# Patient Record
Sex: Female | Born: 1955 | Race: White | Hispanic: No | Marital: Married | Smoking: Former smoker
Health system: Southern US, Community
[De-identification: ages and names within clinical notes are randomized; demographics above are authoritative.]

## PROBLEM LIST (undated history)

## (undated) DIAGNOSIS — T7840XA Allergy, unspecified, initial encounter: Secondary | ICD-10-CM

## (undated) HISTORY — DX: Allergy, unspecified, initial encounter: T78.40XA

---

## 1999-01-09 ENCOUNTER — Other Ambulatory Visit: Admission: RE | Admit: 1999-01-09 | Discharge: 1999-01-09 | Payer: Self-pay | Admitting: Obstetrics and Gynecology

## 1999-12-04 ENCOUNTER — Other Ambulatory Visit: Admission: RE | Admit: 1999-12-04 | Discharge: 1999-12-04 | Payer: Self-pay | Admitting: Obstetrics and Gynecology

## 2000-09-12 ENCOUNTER — Encounter: Payer: Self-pay | Admitting: Family Medicine

## 2000-09-12 ENCOUNTER — Encounter: Admission: RE | Admit: 2000-09-12 | Discharge: 2000-09-12 | Payer: Self-pay | Admitting: Family Medicine

## 2002-03-04 ENCOUNTER — Other Ambulatory Visit: Admission: RE | Admit: 2002-03-04 | Discharge: 2002-03-04 | Payer: Self-pay | Admitting: Obstetrics and Gynecology

## 2002-03-08 ENCOUNTER — Encounter: Payer: Self-pay | Admitting: Family Medicine

## 2002-03-08 ENCOUNTER — Encounter: Admission: RE | Admit: 2002-03-08 | Discharge: 2002-03-08 | Payer: Self-pay | Admitting: Family Medicine

## 2003-05-10 ENCOUNTER — Other Ambulatory Visit: Admission: RE | Admit: 2003-05-10 | Discharge: 2003-05-10 | Payer: Self-pay | Admitting: Obstetrics and Gynecology

## 2003-10-27 ENCOUNTER — Encounter: Admission: RE | Admit: 2003-10-27 | Discharge: 2003-10-27 | Payer: Self-pay | Admitting: Family Medicine

## 2014-03-23 ENCOUNTER — Ambulatory Visit (INDEPENDENT_AMBULATORY_CARE_PROVIDER_SITE_OTHER): Payer: BC Managed Care – PPO | Admitting: Family Medicine

## 2014-03-23 ENCOUNTER — Ambulatory Visit (INDEPENDENT_AMBULATORY_CARE_PROVIDER_SITE_OTHER): Payer: BC Managed Care – PPO

## 2014-03-23 VITALS — BP 108/72 | HR 99 | Temp 98.0°F | Resp 18 | Ht 65.75 in | Wt 97.2 lb

## 2014-03-23 DIAGNOSIS — R634 Abnormal weight loss: Secondary | ICD-10-CM

## 2014-03-23 DIAGNOSIS — R059 Cough, unspecified: Secondary | ICD-10-CM

## 2014-03-23 DIAGNOSIS — R05 Cough: Secondary | ICD-10-CM

## 2014-03-23 DIAGNOSIS — J209 Acute bronchitis, unspecified: Secondary | ICD-10-CM

## 2014-03-23 DIAGNOSIS — J439 Emphysema, unspecified: Secondary | ICD-10-CM

## 2014-03-23 LAB — POCT CBC
Granulocyte percent: 69.8 %G (ref 37–80)
HCT, POC: 47.2 % (ref 37.7–47.9)
Hemoglobin: 15.1 g/dL (ref 12.2–16.2)
Lymph, poc: 3.3 (ref 0.6–3.4)
MCH, POC: 29.2 pg (ref 27–31.2)
MCHC: 32.1 g/dL (ref 31.8–35.4)
MCV: 90.9 fL (ref 80–97)
MID (cbc): 1.1 — AB (ref 0–0.9)
MPV: 7.1 fL (ref 0–99.8)
POC Granulocyte: 10.2 — AB (ref 2–6.9)
POC LYMPH PERCENT: 22.5 %L (ref 10–50)
POC MID %: 7.7 %M (ref 0–12)
Platelet Count, POC: 437 10*3/uL — AB (ref 142–424)
RBC: 5.19 M/uL (ref 4.04–5.48)
RDW, POC: 14.4 %
WBC: 14.6 10*3/uL — AB (ref 4.6–10.2)

## 2014-03-23 MED ORDER — CEFDINIR 300 MG PO CAPS: 600.0000 mg | ORAL_CAPSULE | Freq: Every day | ORAL | Status: DC

## 2014-03-23 MED ORDER — BENZONATATE 100 MG PO CAPS: 100.0000 mg | ORAL_CAPSULE | Freq: Three times a day (TID) | ORAL | Status: DC | PRN

## 2014-03-23 NOTE — Progress Notes (Signed)
Subjective: 58 year old lady who has not been to this practice for several years. She has been feeling bad since sometime in early October. She developed a problem with a lot of pain in her teeth. Gums and teeth appeared normal. She said this passed away and then it went into headache cough. She is persisted with a bad cough. She is not able to get comfortable or rest well due to the cough. She hurts in the right side of her chest. She has not had particular shortness of breath on exertion. She does have a history of weight loss, about 5 pounds in the last year but about 25 or 30 pounds in the last 5 years. She does not have any problems with nausea or vomiting. She's not been running any fever. She thinks she had pneumonia once a long time ago. She has been a cigarette smoker, not smoking much since she has had this cough. She is retired from Veterinary surgeon work.  Objective: Very thin lady in some discomfort from the constant cough. She holds her chest somewhat she is coughing. The cough is wet. Some tenderness over the maxillary sinuses. Throat is clear. Neck supple without significant nodes. Chest has a very wet cough is described above. On palpation there is a rattling palpable throughout the chest. The right upper lung areas dull to percussion. She has scattered crackles and rhonchi in her lungs. Abdomen is soft and nontender.  Assessment: Cough. Weight loss. Possible sinusitis. History of tobacco use disorder  Plan: Chest x-ray and CBC  Results for orders placed or performed in visit on 03/23/14  POCT CBC  Result Value Ref Range   WBC 14.6 (A) 4.6 - 10.2 K/uL   Lymph, poc 3.3 0.6 - 3.4   POC LYMPH PERCENT 22.5 10 - 50 %L   MID (cbc) 1.1 (A) 0 - 0.9   POC MID % 7.7 0 - 12 %M   POC Granulocyte 10.2 (A) 2 - 6.9   Granulocyte percent 69.8 37 - 80 %G   RBC 5.19 4.04 - 5.48 M/uL   Hemoglobin 15.1 12.2 - 16.2 g/dL   HCT, POC 47.2 37.7 - 47.9 %   MCV 90.9 80 - 97 fL   MCH, POC 29.2 27 - 31.2  pg   MCHC 32.1 31.8 - 35.4 g/dL   RDW, POC 14.4 %   Platelet Count, POC 437 (A) 142 - 424 K/uL   MPV 7.1 0 - 99.8 fL   [facility] reading (PRIMARY) by  Dr. Linna Darner Minimal congestion along right diaphragm.  Emphysema..  Assessment: Acute bronchitis and cough Emphysema Tobacco abuse Right chest wall pain Weight loss  Plan: Will add on a TSH and C met. Treat with Omnicef. She had a rash many years ago from a penicillin, and advised her that there is about a 5% cross allergenicity so to discontinue it quickly and let us know if there is any problem from the [place]. Benzonatate for cough Advise taking ibuprofen or Aleve for the chest wall pain  If she has continued to lose weight over the next 3 months she should return for further checkup.

## 2014-03-23 NOTE — Patient Instructions (Addendum)
Stop smoking. Get away from any excuses for picking up more cigarettes.  Omnicef one twice daily for antibiotic  Benzonatate 1 or 2 pills 3 times daily as needed for cough  Take Aleve 2 pills twice daily for chest wall pain. Other option is to take ibuprofen 800 mg (4x2) 3 times daily.  Drink plenty of fluids and get enough rest so that the body can get rid of the infection  If not much better in 2-3 days come back for a recheck .return sooner if any time if needed.

## 2014-03-24 LAB — TSH: TSH: 1.854 u[IU]/mL (ref 0.350–4.500)

## 2014-03-24 LAB — COMPREHENSIVE METABOLIC PANEL
ALT: 11 U/L (ref 0–35)
AST: 16 U/L (ref 0–37)
Albumin: 3.9 g/dL (ref 3.5–5.2)
Alkaline Phosphatase: 96 U/L (ref 39–117)
BUN: 10 mg/dL (ref 6–23)
CO2: 28 mEq/L (ref 19–32)
Calcium: 9.5 mg/dL (ref 8.4–10.5)
Chloride: 99 mEq/L (ref 96–112)
Creat: 0.53 mg/dL (ref 0.50–1.10)
Glucose, Bld: 84 mg/dL (ref 70–99)
Potassium: 4.5 mEq/L (ref 3.5–5.3)
Sodium: 139 mEq/L (ref 135–145)
Total Bilirubin: 0.5 mg/dL (ref 0.2–1.2)
Total Protein: 6.5 g/dL (ref 6.0–8.3)

## 2014-03-28 ENCOUNTER — Encounter: Payer: Self-pay | Admitting: *Deleted

## 2018-09-21 ENCOUNTER — Emergency Department (HOSPITAL_BASED_OUTPATIENT_CLINIC_OR_DEPARTMENT_OTHER): Payer: BC Managed Care – PPO

## 2018-09-21 ENCOUNTER — Inpatient Hospital Stay (HOSPITAL_COMMUNITY): Payer: BC Managed Care – PPO

## 2018-09-21 ENCOUNTER — Encounter (HOSPITAL_BASED_OUTPATIENT_CLINIC_OR_DEPARTMENT_OTHER): Payer: Self-pay | Admitting: Respiratory Therapy

## 2018-09-21 ENCOUNTER — Other Ambulatory Visit: Payer: Self-pay

## 2018-09-21 ENCOUNTER — Inpatient Hospital Stay (HOSPITAL_BASED_OUTPATIENT_CLINIC_OR_DEPARTMENT_OTHER): Payer: BC Managed Care – PPO

## 2018-09-21 ENCOUNTER — Inpatient Hospital Stay (HOSPITAL_BASED_OUTPATIENT_CLINIC_OR_DEPARTMENT_OTHER)
Admission: EM | Admit: 2018-09-21 | Discharge: 2018-09-25 | DRG: 208 | Disposition: A | Discharge status: Home Health | Payer: BC Managed Care – PPO | Attending: Internal Medicine | Admitting: Internal Medicine

## 2018-09-21 DIAGNOSIS — J9811 Atelectasis: Secondary | ICD-10-CM | POA: Diagnosis present

## 2018-09-21 DIAGNOSIS — Z20828 Contact with and (suspected) exposure to other viral communicable diseases: Secondary | ICD-10-CM | POA: Diagnosis present

## 2018-09-21 DIAGNOSIS — I2729 Other secondary pulmonary hypertension: Secondary | ICD-10-CM | POA: Diagnosis not present

## 2018-09-21 DIAGNOSIS — R188 Other ascites: Secondary | ICD-10-CM | POA: Diagnosis present

## 2018-09-21 DIAGNOSIS — J90 Pleural effusion, not elsewhere classified: Secondary | ICD-10-CM | POA: Diagnosis not present

## 2018-09-21 DIAGNOSIS — R34 Anuria and oliguria: Secondary | ICD-10-CM | POA: Diagnosis not present

## 2018-09-21 DIAGNOSIS — E872 Acidosis: Secondary | ICD-10-CM | POA: Diagnosis present

## 2018-09-21 DIAGNOSIS — J441 Chronic obstructive pulmonary disease with (acute) exacerbation: Secondary | ICD-10-CM | POA: Diagnosis not present

## 2018-09-21 DIAGNOSIS — J9691 Respiratory failure, unspecified with hypoxia: Secondary | ICD-10-CM | POA: Diagnosis not present

## 2018-09-21 DIAGNOSIS — I2721 Secondary pulmonary arterial hypertension: Secondary | ICD-10-CM | POA: Diagnosis present

## 2018-09-21 DIAGNOSIS — E876 Hypokalemia: Secondary | ICD-10-CM | POA: Diagnosis present

## 2018-09-21 DIAGNOSIS — R1084 Generalized abdominal pain: Secondary | ICD-10-CM | POA: Diagnosis present

## 2018-09-21 DIAGNOSIS — Z86718 Personal history of other venous thrombosis and embolism: Secondary | ICD-10-CM

## 2018-09-21 DIAGNOSIS — J9601 Acute respiratory failure with hypoxia: Principal | ICD-10-CM

## 2018-09-21 DIAGNOSIS — J9602 Acute respiratory failure with hypercapnia: Secondary | ICD-10-CM | POA: Diagnosis present

## 2018-09-21 DIAGNOSIS — F17200 Nicotine dependence, unspecified, uncomplicated: Secondary | ICD-10-CM | POA: Diagnosis present

## 2018-09-21 DIAGNOSIS — L89321 Pressure ulcer of left buttock, stage 1: Secondary | ICD-10-CM | POA: Diagnosis present

## 2018-09-21 DIAGNOSIS — Z978 Presence of other specified devices: Secondary | ICD-10-CM

## 2018-09-21 DIAGNOSIS — D689 Coagulation defect, unspecified: Secondary | ICD-10-CM | POA: Diagnosis present

## 2018-09-21 DIAGNOSIS — G9341 Metabolic encephalopathy: Secondary | ICD-10-CM | POA: Diagnosis present

## 2018-09-21 DIAGNOSIS — I5033 Acute on chronic diastolic (congestive) heart failure: Secondary | ICD-10-CM | POA: Diagnosis present

## 2018-09-21 DIAGNOSIS — N179 Acute kidney failure, unspecified: Secondary | ICD-10-CM

## 2018-09-21 DIAGNOSIS — I2781 Cor pulmonale (chronic): Secondary | ICD-10-CM | POA: Diagnosis present

## 2018-09-21 DIAGNOSIS — E871 Hypo-osmolality and hyponatremia: Secondary | ICD-10-CM | POA: Diagnosis present

## 2018-09-21 DIAGNOSIS — E861 Hypovolemia: Secondary | ICD-10-CM | POA: Diagnosis not present

## 2018-09-21 DIAGNOSIS — I5031 Acute diastolic (congestive) heart failure: Secondary | ICD-10-CM | POA: Diagnosis not present

## 2018-09-21 DIAGNOSIS — J9600 Acute respiratory failure, unspecified whether with hypoxia or hypercapnia: Secondary | ICD-10-CM | POA: Diagnosis present

## 2018-09-21 DIAGNOSIS — K761 Chronic passive congestion of liver: Secondary | ICD-10-CM | POA: Diagnosis present

## 2018-09-21 DIAGNOSIS — J9692 Respiratory failure, unspecified with hypercapnia: Secondary | ICD-10-CM | POA: Diagnosis not present

## 2018-09-21 DIAGNOSIS — E875 Hyperkalemia: Secondary | ICD-10-CM

## 2018-09-21 DIAGNOSIS — M7989 Other specified soft tissue disorders: Secondary | ICD-10-CM | POA: Diagnosis not present

## 2018-09-21 LAB — POCT I-STAT EG7
Acid-Base Excess: 2 mmol/L (ref 0.0–2.0)
Bicarbonate: 34.3 mmol/L — ABNORMAL HIGH (ref 20.0–28.0)
Calcium, Ion: 1.09 mmol/L — ABNORMAL LOW (ref 1.15–1.40)
HCT: 53 % — ABNORMAL HIGH (ref 36.0–46.0)
Hemoglobin: 18 g/dL — ABNORMAL HIGH (ref 12.0–15.0)
O2 Saturation: 41 %
Potassium: 6.2 mmol/L — ABNORMAL HIGH (ref 3.5–5.1)
Sodium: 123 mmol/L — ABNORMAL LOW (ref 135–145)
TCO2: 37 mmol/L — ABNORMAL HIGH (ref 22–32)
pCO2, Ven: 89.5 mmHg (ref 44.0–60.0)
pH, Ven: 7.191 — CL (ref 7.250–7.430)
pO2, Ven: 30 mmHg — CL (ref 32.0–45.0)

## 2018-09-21 LAB — URINALYSIS, MICROSCOPIC (REFLEX)

## 2018-09-21 LAB — GLUCOSE, CAPILLARY: Glucose-Capillary: 82 mg/dL (ref 70–99)

## 2018-09-21 LAB — POCT I-STAT 7, (LYTES, BLD GAS, ICA,H+H)
Acid-Base Excess: 2 mmol/L (ref 0.0–2.0)
Bicarbonate: 31.7 mmol/L — ABNORMAL HIGH (ref 20.0–28.0)
Bicarbonate: 29.8 mmol/L — ABNORMAL HIGH (ref 20.0–28.0)
Calcium, Ion: 1.12 mmol/L — ABNORMAL LOW (ref 1.15–1.40)
Calcium, Ion: 1.09 mmol/L — ABNORMAL LOW (ref 1.15–1.40)
HCT: 52 % — ABNORMAL HIGH (ref 36.0–46.0)
HCT: 50 % — ABNORMAL HIGH (ref 36.0–46.0)
Hemoglobin: 17.7 g/dL — ABNORMAL HIGH (ref 12.0–15.0)
Hemoglobin: 17 g/dL — ABNORMAL HIGH (ref 12.0–15.0)
O2 Saturation: 94 %
O2 Saturation: 96 %
Patient temperature: 98.6
Potassium: 5.1 mmol/L (ref 3.5–5.1)
Potassium: 4.6 mmol/L (ref 3.5–5.1)
Sodium: 122 mmol/L — ABNORMAL LOW (ref 135–145)
Sodium: 126 mmol/L — ABNORMAL LOW (ref 135–145)
TCO2: 34 mmol/L — ABNORMAL HIGH (ref 22–32)
TCO2: 31 mmol/L (ref 22–32)
pCO2 arterial: 84.1 mmHg (ref 32.0–48.0)
pCO2 arterial: 56.4 mmHg — ABNORMAL HIGH (ref 32.0–48.0)
pH, Arterial: 7.185 — CL (ref 7.350–7.450)
pH, Arterial: 7.33 — ABNORMAL LOW (ref 7.350–7.450)
pO2, Arterial: 93 mmHg (ref 83.0–108.0)
pO2, Arterial: 87 mmHg (ref 83.0–108.0)

## 2018-09-21 LAB — CBC WITH DIFFERENTIAL/PLATELET
Abs Immature Granulocytes: 0.18 10*3/uL — ABNORMAL HIGH (ref 0.00–0.07)
Basophils Absolute: 0 10*3/uL (ref 0.0–0.1)
Basophils Relative: 0 %
Eosinophils Absolute: 0 10*3/uL (ref 0.0–0.5)
Eosinophils Relative: 0 %
HCT: 53 % — ABNORMAL HIGH (ref 36.0–46.0)
Hemoglobin: 15.9 g/dL — ABNORMAL HIGH (ref 12.0–15.0)
Immature Granulocytes: 1 %
Lymphocytes Relative: 3 %
Lymphs Abs: 0.6 10*3/uL — ABNORMAL LOW (ref 0.7–4.0)
MCH: 27 pg (ref 26.0–34.0)
MCHC: 30 g/dL (ref 30.0–36.0)
MCV: 90 fL (ref 80.0–100.0)
Monocytes Absolute: 2.1 10*3/uL — ABNORMAL HIGH (ref 0.1–1.0)
Monocytes Relative: 11 %
Neutro Abs: 17.1 10*3/uL — ABNORMAL HIGH (ref 1.7–7.7)
Neutrophils Relative %: 85 %
Platelets: 343 10*3/uL (ref 150–400)
RBC: 5.89 MIL/uL — ABNORMAL HIGH (ref 3.87–5.11)
RDW: 14.1 % (ref 11.5–15.5)
Smear Review: NORMAL
WBC: 20.1 10*3/uL — ABNORMAL HIGH (ref 4.0–10.5)
nRBC: 2.1 % — ABNORMAL HIGH (ref 0.0–0.2)

## 2018-09-21 LAB — RAPID URINE DRUG SCREEN, HOSP PERFORMED
Amphetamines: NOT DETECTED
Barbiturates: NOT DETECTED
Benzodiazepines: NOT DETECTED
Cocaine: NOT DETECTED
Opiates: NOT DETECTED
Tetrahydrocannabinol: NOT DETECTED

## 2018-09-21 LAB — ETHANOL: Alcohol, Ethyl (B): <10 mg/dL (ref <10)

## 2018-09-21 LAB — URINALYSIS, ROUTINE W REFLEX MICROSCOPIC
Glucose, UA: NEGATIVE mg/dL
Ketones, ur: NEGATIVE mg/dL
Leukocytes,Ua: NEGATIVE
Nitrite: NEGATIVE
Protein, ur: 30 mg/dL — AB
Specific Gravity, Urine: >1.03 — ABNORMAL HIGH (ref 1.005–1.030)
pH: 5.5 (ref 5.0–8.0)

## 2018-09-21 LAB — BRAIN NATRIURETIC PEPTIDE: B Natriuretic Peptide: 2229 pg/mL — ABNORMAL HIGH (ref 0.0–100.0)

## 2018-09-21 LAB — LACTIC ACID, PLASMA
Lactic Acid, Venous: 3.7 mmol/L (ref 0.5–1.9)
Lactic Acid, Venous: 2.1 mmol/L (ref 0.5–1.9)
Lactic Acid, Venous: 1.6 mmol/L (ref 0.5–1.9)

## 2018-09-21 LAB — COMPREHENSIVE METABOLIC PANEL
ALT: 479 U/L — ABNORMAL HIGH (ref 0–44)
AST: 563 U/L — ABNORMAL HIGH (ref 15–41)
Albumin: 3.6 g/dL (ref 3.5–5.0)
Alkaline Phosphatase: 84 U/L (ref 38–126)
Anion gap: 16 — ABNORMAL HIGH (ref 5–15)
BUN: 55 mg/dL — ABNORMAL HIGH (ref 8–23)
CO2: 23 mmol/L (ref 22–32)
Calcium: 8.1 mg/dL — ABNORMAL LOW (ref 8.9–10.3)
Chloride: 87 mmol/L — ABNORMAL LOW (ref 98–111)
Creatinine, Ser: 1.92 mg/dL — ABNORMAL HIGH (ref 0.44–1.00)
GFR calc Af Amer: 32 mL/min — ABNORMAL LOW (ref >60)
GFR calc non Af Amer: 27 mL/min — ABNORMAL LOW (ref >60)
Glucose, Bld: 109 mg/dL — ABNORMAL HIGH (ref 70–99)
Potassium: 5.5 mmol/L — ABNORMAL HIGH (ref 3.5–5.1)
Sodium: 126 mmol/L — ABNORMAL LOW (ref 135–145)
Total Bilirubin: 1.3 mg/dL — ABNORMAL HIGH (ref 0.3–1.2)
Total Protein: 6.1 g/dL — ABNORMAL LOW (ref 6.5–8.1)

## 2018-09-21 LAB — ACETAMINOPHEN LEVEL: Acetaminophen (Tylenol), Serum: <10 ug/mL — ABNORMAL LOW (ref 10–30)

## 2018-09-21 LAB — MRSA PCR SCREENING: MRSA by PCR: NEGATIVE

## 2018-09-21 LAB — PROTIME-INR
INR: 2 — ABNORMAL HIGH (ref 0.8–1.2)
Prothrombin Time: 22.6 seconds — ABNORMAL HIGH (ref 11.4–15.2)

## 2018-09-21 LAB — SARS CORONAVIRUS 2 AG (30 MIN TAT): SARS Coronavirus 2 Ag: NEGATIVE

## 2018-09-21 LAB — TROPONIN I: Troponin I: 0.06 ng/mL (ref <0.03)

## 2018-09-21 MED ORDER — IPRATROPIUM-ALBUTEROL 0.5-2.5 (3) MG/3ML IN SOLN
3.0000 mL | Freq: Four times a day (QID) | RESPIRATORY_TRACT | Status: DC
  Administered 2018-09-21 – 2018-09-22 (×4): 3 mL via RESPIRATORY_TRACT
  Filled 2018-09-21 (×4): qty 3

## 2018-09-21 MED ORDER — FENTANYL CITRATE (PF) 100 MCG/2ML IJ SOLN
INTRAMUSCULAR | Status: AC
  Filled 2018-09-21: qty 2

## 2018-09-21 MED ORDER — INSULIN ASPART 100 UNIT/ML ~~LOC~~ SOLN: 0.0000 [IU] | SUBCUTANEOUS | Status: DC

## 2018-09-21 MED ORDER — FUROSEMIDE 10 MG/ML IJ SOLN
40.0000 mg | INTRAMUSCULAR | Status: AC
  Administered 2018-09-21: 40 mg via INTRAVENOUS
  Filled 2018-09-21: qty 4

## 2018-09-21 MED ORDER — DOCUSATE SODIUM 50 MG/5ML PO LIQD
100.0000 mg | Freq: Two times a day (BID) | ORAL | Status: DC | PRN
  Filled 2018-09-21: qty 10

## 2018-09-21 MED ORDER — PANTOPRAZOLE SODIUM 40 MG IV SOLR
40.0000 mg | Freq: Every day | INTRAVENOUS | Status: DC
  Administered 2018-09-21: 40 mg via INTRAVENOUS
  Filled 2018-09-21: qty 40

## 2018-09-21 MED ORDER — FENTANYL CITRATE (PF) 100 MCG/2ML IJ SOLN: 50.0000 ug | INTRAMUSCULAR | Status: DC | PRN

## 2018-09-21 MED ORDER — ALBUTEROL SULFATE (2.5 MG/3ML) 0.083% IN NEBU: 2.5000 mg | INHALATION_SOLUTION | RESPIRATORY_TRACT | Status: DC | PRN

## 2018-09-21 MED ORDER — ROCURONIUM BROMIDE 50 MG/5ML IV SOLN
50.0000 mg | Freq: Once | INTRAVENOUS | Status: AC
  Administered 2018-09-21: 20:00:00 50 mg via INTRAVENOUS

## 2018-09-21 MED ORDER — BISACODYL 10 MG RE SUPP: 10.0000 mg | Freq: Every day | RECTAL | Status: DC | PRN

## 2018-09-21 MED ORDER — SODIUM CHLORIDE 0.9 % IV SOLN
2.0000 g | Freq: Once | INTRAVENOUS | Status: AC
  Administered 2018-09-21: 2 g via INTRAVENOUS
  Filled 2018-09-21: qty 2

## 2018-09-21 MED ORDER — HEPARIN SODIUM (PORCINE) 5000 UNIT/ML IJ SOLN
5000.0000 [IU] | Freq: Three times a day (TID) | INTRAMUSCULAR | Status: DC
  Administered 2018-09-21 – 2018-09-25 (×9): 5000 [IU] via SUBCUTANEOUS
  Filled 2018-09-21 (×9): qty 1

## 2018-09-21 MED ORDER — PROPOFOL 1000 MG/100ML IV EMUL
INTRAVENOUS | Status: AC
  Filled 2018-09-21: qty 100

## 2018-09-21 MED ORDER — SODIUM CHLORIDE 0.9 % IV SOLN
500.0000 mg | Freq: Once | INTRAVENOUS | Status: DC
  Filled 2018-09-21: qty 500

## 2018-09-21 MED ORDER — ETOMIDATE 2 MG/ML IV SOLN
10.0000 mg | Freq: Once | INTRAVENOUS | Status: AC
  Administered 2018-09-21: 20:00:00 10 mg via INTRAVENOUS

## 2018-09-21 MED ORDER — CHLORHEXIDINE GLUCONATE 0.12% ORAL RINSE (MEDLINE KIT)
15.0000 mL | Freq: Two times a day (BID) | OROMUCOSAL | Status: DC
  Administered 2018-09-21 – 2018-09-22 (×2): 15 mL via OROMUCOSAL

## 2018-09-21 MED ORDER — SODIUM CHLORIDE 0.9 % IV SOLN
INTRAVENOUS | Status: DC | PRN
  Administered 2018-09-21: 14:00:00 500 mL via INTRAVENOUS

## 2018-09-21 MED ORDER — PROPOFOL 1000 MG/100ML IV EMUL: 0.0000 ug/kg/min | INTRAVENOUS | Status: DC

## 2018-09-21 MED ORDER — ORAL CARE MOUTH RINSE
15.0000 mL | OROMUCOSAL | Status: DC
  Administered 2018-09-21 – 2018-09-22 (×5): 15 mL via OROMUCOSAL

## 2018-09-21 MED ORDER — SODIUM CHLORIDE 0.9 % IV SOLN
1.0000 g | Freq: Once | INTRAVENOUS | Status: DC
  Filled 2018-09-21: qty 10

## 2018-09-21 MED ORDER — PROPOFOL 1000 MG/100ML IV EMUL
5.0000 ug/kg/min | INTRAVENOUS | Status: DC
  Administered 2018-09-21: 30 ug/kg/min via INTRAVENOUS
  Administered 2018-09-21: 5 ug/kg/min via INTRAVENOUS

## 2018-09-21 MED ORDER — VANCOMYCIN HCL IN DEXTROSE 1-5 GM/200ML-% IV SOLN
1000.0000 mg | Freq: Once | INTRAVENOUS | Status: AC
  Administered 2018-09-21: 14:00:00 1000 mg via INTRAVENOUS
  Filled 2018-09-21: qty 200

## 2018-09-21 MED ORDER — AZITHROMYCIN 500 MG IV SOLR
INTRAVENOUS | Status: AC
  Filled 2018-09-21: qty 500

## 2018-09-21 MED ORDER — CEFEPIME HCL 2 G IJ SOLR
INTRAMUSCULAR | Status: AC
  Filled 2018-09-21: qty 2

## 2018-09-21 MED ORDER — PROPOFOL 1000 MG/100ML IV EMUL
5.0000 ug/kg/min | INTRAVENOUS | Status: DC
  Administered 2018-09-21: 22:00:00 35 ug/kg/min via INTRAVENOUS
  Administered 2018-09-22: 10 ug/kg/min via INTRAVENOUS
  Filled 2018-09-21: qty 100

## 2018-09-21 NOTE — ED Notes (Signed)
I spoke with Mr. Evelyn Hughes and informed him that patient is on BiPap, still confused that the plan was to transfer her to [facility] as per his conversation with Dr. Rex Kras earlier.  I informed him that once I got a room assignment, I will give him a call.  I got a  telephone consent from him witnessed by Mena Pauls, RN since patient is still confused.

## 2018-09-21 NOTE — ED Triage Notes (Signed)
Bilateral leg swelling and difficulty breathing started this past Tuesday.

## 2018-09-21 NOTE — ED Notes (Signed)
Date and time results received: 09/21/18 1315 (use smartphrase ".now" to insert current time)  Test: Lactic acid Critical Value: 3.7  Name of Provider Notified: Dr. Rex Kras  Orders Received? Or Actions Taken?: Yes

## 2018-09-21 NOTE — Progress Notes (Signed)
[place] Progress Note Patient Name: Evelyn Hughes DOB: [birth year] MRN: [id number]   Date of Service  09/21/2018  HPI/Events of Note  New admission for Acute hypoxic hypercapnic respiratory failure probably from a COPD and CHF exacerbation.    eICU Interventions  History of COPD and probably new onset CHF. Rapid COVID negative.  AKI.  Troponin 0.06. Notified bedside ICU team.      Intervention Category Major Interventions: Respiratory failure - evaluation and management;Hypercarbia - evaluation and management;Hypoxemia - evaluation and management Intermediate Interventions: Communication with other healthcare providers and/or family Evaluation Type: New Patient Evaluation  Mady Gemma 09/21/2018, 10:12 PM

## 2018-09-21 NOTE — ED Provider Notes (Signed)
Physical Exam  BP 106/70   Pulse 98   Resp 20   Ht 5\' 8"  (1.727 m)   SpO2 100%   BMI 14.78 kg/m   Physical Exam Vitals signs and nursing note reviewed.  Constitutional:      General: She is in acute distress.     Appearance: She is cachectic. She is ill-appearing and toxic-appearing.  Neck:     Vascular: JVD present.  Cardiovascular:     Rate and Rhythm: Normal rate.  Pulmonary:     Breath sounds: Wheezing (diminished breath sounds and wheezing) present.  Musculoskeletal:     Right lower leg: Edema present.     Left lower leg: Edema present.  Neurological:     Mental Status: She is lethargic.     ED Course/Procedures     .Critical Care Performed by: Gareth Morgan, MD Authorized by: Gareth Morgan, MD   Critical care provider statement:    Critical care time (minutes):  45   Critical care was necessary to treat or prevent imminent or life-threatening deterioration of the following conditions:  Respiratory failure   Critical care was time spent personally by me on the following activities:  Re-evaluation of patient's condition, ordering and review of radiographic studies, pulse oximetry, review of old charts, evaluation of patient's response to treatment and discussions with consultants Procedure Name: Intubation Date/Time: 09/22/2018 2:50 AM Performed by: Gareth Morgan, MD Pre-anesthesia Checklist: Patient identified, Patient being monitored, Emergency Drugs available, Timeout performed and Suction available Oxygen Delivery Method: Non-rebreather mask Preoxygenation: Pre-oxygenation with 100% oxygen Induction Type: Rapid sequence Laryngoscope Size: Glidescope Grade View: Grade I Tube size: 7.5 mm Number of attempts: 1 Placement Confirmation: ETT inserted through vocal cords under direct vision,  CO2 detector and Breath sounds checked- equal and bilateral Comments: Preoxygenated with bipap       MDM    Please see Dr. Eddie Dibbles note for prior history,  physical and care.  Briefly, this is a 63 year old female with a history of COPD presented with concern for shortness of breath, leg swelling, and altered mental status.  Patient with both hypoxic and hypercarbic respiratory failure on arrival to the emergency department, with lab abnormalities including lactic acidosis, acute renal failure, hyponatremia, transaminitis, leukocytosis, troponin of 0.06 and BNP of 2229.  COVID-19 testing negative, and have low clinical suspicion at this time.  Suspect most likely etiology of her respiratory failure is a combination of new onset CHF, as well as a COPD.  She had been given empiric antibiotics for possible pneumonia.  At time of signout, CT head, chest abdomen pelvis pending.  CT shows no evidence of acute pathology, does show pleural effusion, signs of anasarca and ascites.  Do feel this is consistent with likely congestive heart failure as a direct source of hypoxic respiratory failure.  On arrival, patient had oxygen saturation down into the 60s.  Initially at time of my assumption of care, patient has been placed on BiPAP for hypercarbic respiratory failure, with initial plan to admit to stepdown unit as patient was protecting her airway.  On reevaluation, she has not shown any improvement, and is difficult to arouse.  Her blood gas shows continued hypercarbic respiratory failure.  While her labs have shown improvement with decreasing lactic acidosis, she continues to have a severe respiratory acidosis and altered mental status despite BiPAP.   Given this, intubated patient as above.  Consulted ICU and patient was transferred in critical condition.      Gareth Morgan, MD 09/22/18  0252  

## 2018-09-21 NOTE — ED Notes (Signed)
ED Provider at bedside. 

## 2018-09-21 NOTE — ED Notes (Signed)
Claudius Sis, RN, Charge Nurse took over the care.  Patient was transferred to Room 14 and was orally intubated.  Report given to Aldona Bar, RN at [name], patient's husband, and informed him of patient's room assignment and ambulance staff is here to transfer patient.

## 2018-09-21 NOTE — ED Provider Notes (Signed)
[facility] EMERGENCY DEPARTMENT Provider Note   CSN: [id number] Arrival date & time: 09/21/18  1208    History   Chief Complaint Chief Complaint  Patient presents with   Swollen Legs    HPI Evelyn Hughes is a 63 y.o. female.     63 year old female with unknown past medical history who presents with leg swelling and shortness of breath.  The patient presents to the ED complaining of bilateral leg swelling and shortness of breath that began 6 days ago.  She is unable to answer any further questions for me. She is not on O2 at home.  Husband was later present to provide further history.  He states that her leg swelling started a week ago and they tried elevation with only mild improvement. He reports she has had poor appetite and worsening generalized weakness. Over the past day, he has also noticed confusion. He has tried to get her to come to the hospital sooner but hasn't been able to convince her until today. She has had occasional cough but no severe cough, fevers, runny nose, or other URI symptoms. No vomiting or diarrhea.  LEVEL 5 CAVEAT DUE TO AMS  The history is provided by the patient. The history is limited by the condition of the patient.    Past Medical History:  Diagnosis Date   Allergy     There are no active problems to display for this patient.   History reviewed. No pertinent surgical history.   OB History    Gravida  2   Para      Term      Preterm      AB      Living  2     SAB      TAB      Ectopic      Multiple      Live Births               Home Medications    Prior to Admission medications   Medication Sig Start Date End Date Taking? Authorizing Provider  benzonatate (TESSALON) 100 MG capsule Take 1-2 capsules (100-200 mg total) by mouth 3 (three) times daily as needed. 03/23/14   Posey Boyer, MD  cefdinir (OMNICEF) 300 MG capsule Take 2 capsules (600 mg total) by mouth daily. 03/23/14   Posey Boyer, MD     Family History Family History  Problem Relation Age of Onset   Cancer Mother     Social History Social History   Tobacco Use   Smoking status: Current Every Day Smoker   Smokeless tobacco: Never Used  Substance Use Topics   Alcohol use: No    Alcohol/week: 0.0 standard drinks   Drug use: No     Allergies   Sulfa antibiotics and Penicillins   Review of Systems Review of Systems  Unable to perform ROS: Mental status change     Physical Exam Updated Vital Signs BP 124/87    Pulse 93    Resp (!) 35    SpO2 100%   Physical Exam Vitals signs and nursing note reviewed.  Constitutional:      Appearance: She is well-developed. She is ill-appearing.     Comments: Thin, frail, ill appearing  HENT:     Head: Normocephalic and atraumatic.     Nose: Nose normal.     Mouth/Throat:     Mouth: Mucous membranes are dry.  Eyes:     Conjunctiva/sclera: Conjunctivae normal.  Pupils: Pupils are equal, round, and reactive to light.  Neck:     Vascular: JVD present.  Cardiovascular:     Rate and Rhythm: Normal rate and regular rhythm.     Heart sounds: Normal heart sounds. No murmur.  Pulmonary:     Effort: Tachypnea, accessory muscle usage and respiratory distress present.     Breath sounds: Rales present.  Abdominal:     General: Bowel sounds are normal. There is no distension.     Palpations: Abdomen is soft.     Tenderness: There is no abdominal tenderness.  Musculoskeletal:     Right lower leg: Edema present.     Left lower leg: Edema present.     Comments: 3+ pitting edema BLE  Skin:    General: Skin is warm and dry.  Neurological:     Mental Status: She is alert.     Comments: Disoriented, unable to follow commands      ED Treatments / Results  Labs (all labs ordered are listed, but only abnormal results are displayed) Labs Reviewed  COMPREHENSIVE METABOLIC PANEL - Abnormal; Notable for the following components:      Result Value   Sodium 126  (*)    Potassium 5.5 (*)    Chloride 87 (*)    Glucose, Bld 109 (*)    BUN 55 (*)    Creatinine, Ser 1.92 (*)    Calcium 8.1 (*)    Total Protein 6.1 (*)    AST 563 (*)    ALT 479 (*)    Total Bilirubin 1.3 (*)    GFR calc non Af Amer 27 (*)    GFR calc Af Amer 32 (*)    Anion gap 16 (*)    All other components within normal limits  ACETAMINOPHEN LEVEL - Abnormal; Notable for the following components:   Acetaminophen (Tylenol), Serum <10 (*)    All other components within normal limits  BRAIN NATRIURETIC PEPTIDE - Abnormal; Notable for the following components:   B Natriuretic Peptide 2,229.0 (*)    All other components within normal limits  TROPONIN I - Abnormal; Notable for the following components:   Troponin I 0.06 (*)    All other components within normal limits  LACTIC ACID, PLASMA - Abnormal; Notable for the following components:   Lactic Acid, Venous 3.7 (*)    All other components within normal limits  CBC WITH DIFFERENTIAL/PLATELET - Abnormal; Notable for the following components:   WBC 20.1 (*)    RBC 5.89 (*)    Hemoglobin 15.9 (*)    HCT 53.0 (*)    nRBC 2.1 (*)    Neutro Abs 17.1 (*)    Lymphs Abs 0.6 (*)    Monocytes Absolute 2.1 (*)    Abs Immature Granulocytes 0.18 (*)    All other components within normal limits  POCT I-STAT EG7 - Abnormal; Notable for the following components:   pH, Ven 7.204 (*)    pCO2, Ven 74.5 (*)    pO2, Ven 75.0 (*)    Bicarbonate 29.4 (*)    Sodium 122 (*)    Potassium 7.4 (*)    Calcium, Ion 0.95 (*)    HCT 56.0 (*)    Hemoglobin 19.0 (*)    All other components within normal limits  SARS CORONAVIRUS 2 (HOSP ORDER, PERFORMED IN [facility] VIA [facility]BBOTT ID)  CULTURE, BLOOD (ROUTINE X 2)  CULTURE, BLOOD (ROUTINE X 2)  ETHANOL  LACTIC ACID, PLASMA  URINALYSIS, ROUTINE W REFLEX MICROSCOPIC  RAPID URINE DRUG SCREEN, HOSP PERFORMED  I-STAT VENOUS BLOOD GAS, ED  I-STAT VENOUS BLOOD GAS, ED    EKG EKG  Interpretation  Date/Time:  Monday Sep 21 2018 12:28:08 EDT Ventricular Rate:  106 PR Interval:    QRS Duration: 75 QT Interval:  290 QTC Calculation: 385 R Axis:   138 Text Interpretation:  Sinus tachycardia Biatrial enlargement Abnormal R-wave progression, late transition Consider left ventricular hypertrophy ST elevation, consider anterior injury No previous ECGs available Confirmed by Theotis Burrow [phone]) on 09/21/2018 12:46:05 PM   Radiology Dg Chest Port 1 View  Result Date: 09/21/2018 CLINICAL DATA:  Onset hypoxia and respiratory distress today. Lower extremity swelling. EXAM: PORTABLE CHEST 1 VIEW COMPARISON:  PA and lateral chest 03/23/2014. FINDINGS: The chest is hyperexpanded with attenuation of the pulmonary vasculature. There is a small right pleural effusion and basilar airspace disease. The left lung is clear. Heart size is normal. No pneumothorax. Aortic atherosclerosis noted. No acute bony abnormality. IMPRESSION: Right effusion and basilar airspace disease most consistent with pneumonia. Recommend follow-up to clearing. Emphysema. Atherosclerosis. Electronically Signed   By: Inge Rise M.D.   On: 09/21/2018 13:46    Procedures .Critical Care Performed by: Sharlett Iles, MD Authorized by: Sharlett Iles, MD   Critical care provider statement:    Critical care time (minutes):  30   Critical care time was exclusive of:  Separately billable procedures and treating other patients   Critical care was necessary to treat or prevent imminent or life-threatening deterioration of the following conditions:  Respiratory failure and dehydration   Critical care was time spent personally by me on the following activities:  Development of treatment plan with patient or surrogate, evaluation of patient's response to treatment, examination of patient, obtaining history from patient or surrogate, ordering and performing treatments and interventions, ordering and review of  laboratory studies, ordering and review of radiographic studies, re-evaluation of patient's condition and review of old charts   (including critical care time)  Medications Ordered in ED Medications  azithromycin (ZITHROMAX) 500 MG injection (has no administration in time range)  ceFEPIme (MAXIPIME) 2 g injection (has no administration in time range)  0.9 %  sodium chloride infusion ( Intravenous Stopped 09/21/18 1441)  vancomycin (VANCOCIN) IVPB 1000 mg/200 mL premix (1,000 mg Intravenous New Bag/Given 09/21/18 1352)  ceFEPIme (MAXIPIME) 2 g in sodium chloride 0.9 % 100 mL IVPB (0 g Intravenous Stopped 09/21/18 1440)     Initial Impression / Assessment and Plan / ED Course  I have reviewed the triage vital signs and the nursing notes.  Pertinent labs & imaging results that were available during my care of the patient were reviewed by me and considered in my medical decision making (see chart for details).        Pt in respiratory distress on arrival, O2 sat 50-60s on room air, placed on high flow [place] with improvement to high 90s. DDx is broad and includes CHF w/ pulmonary edema, pneumonia, COVID-19, COPD.   Lab work shows initial venous pH 7.20 CO2 75, lactate 3.7, troponin 0 0.06, BNP 2229, Na 126, K 5.5, Cl 87, BUN 55, Cr 1.92, AST 563, ALT 479, AG 16, WBC 20, Hgb 15.9, COVID-19 negative. Due to elevated lactate and leukocytosis w/ AMS, Initiated broad spectrum abx w/ vanc and cefepime.   CXR w/ R effusion, possible pneumonia. Because of resp failure and signs of volume overload, I have held off on IVF  bolus.  Have ordered a CT of chest, abdomen, and pelvis due to concerns for possible mass/malignancy or other acute intra-abdominal process causing transaminitis.  I am signing out to oncoming provider pending CT and admission.  Final Clinical Impressions(s) / ED Diagnoses   Final diagnoses:  None    ED Discharge Orders    None       Jamy Cleckler, Wenda Overland, MD 09/21/18 1525

## 2018-09-21 NOTE — ED Notes (Signed)
Patient is obtunded.  Husband at bedside. MD Made aware.

## 2018-09-21 NOTE — H&P (Addendum)
NAME:  Evelyn Hughes, MRN:  [id number], DOB:  Nov 07, 1955, LOS: 0 ADMISSION DATE:  09/21/2018, CONSULTATION DATE:  09/21/2018 REFERRING MD:  Dr. Billy Fischer, CHIEF COMPLAINT:  Respiratory failure  Brief History   33 yoF from home with no known PMH other than tobacco abuse presenting with progressive hx of fatigue, SOB, and leg swelling and new confusion.  Hypoxic failing HFNC and BIPAP requiring intubation.  Additionally noted to have AKI, Transaminitis, and evidence of volume overload concerning for acute decompensated heart failure.   History of present illness   HPI obtained from medical chart review as patient is intubated and sedated on mechanical ventilation and from taking with patient's husband, [name] on telephone.   63 year old female with history of longterm tobacco abuse, otherwise no known history but not regularly followed by medical care presenting from home to [facility] with 3-4 weeks of progressive fatigue and shortness of breath with exertion.  New confusion since yesterday.  Husband reports she doesn't eat a lot normally but has been more pronounced recently and few episodes of diarrhea.  One complaint of generalized abdominal pain.  No reports of fever, chest pain, N/V, and falls.  No weight loss, recent weight gain. No reports of illicit drug abuse or ETOH use in 15 years.   In the ER she was afebrile, normotensive, and hypoxic on room air with saturations 50-60's with respiratory distress.  Labs noted for Na 126, K 5.5, sCr 1.92, BUN 55, CL 87, AST 563, ALT 479, AG 16, WBC 20k, Hgb 15.9, troponin 0.06, BNP 2229, lactic 3.7, ABG 7.2/ 74/ 75/ 29.  EKG with abnormal R wave progression, sinus tachycardia, and biatrial enlargement.  Given her leukocytosis and lactic, she was started empirically on vancomycin and cefepime.  CXR with emphysematous changes with right pleural effusion and basilar atelectasis.  CT head negative and CT abd/ pelvis/ chest showed no acute intra-abdominal process, small  ascites and anasarca, no pneumonia, bilateral pleural effusions, and emphysematous changes. She was attempted on HFNC and BiPAP and given lasix, but given that she was obtunded, required intubation.  PCCM to accept.   Past Medical History  Tobacco abuse   Significant Hospital Events   5/4 admit   Consults:   Procedures:  5/4 ETT >>  Significant Diagnostic Tests:  5/4 CT abd/ pelvis >> 1.  No acute intra-abdominal process. 2. Small ascites and mild anasarca.  5/4 CT chest >> 1. Moderate right and small left pleural effusions.  No pneumonia. 2. Mild right heart enlargement with dilated main pulmonary artery, suggestive of pulmonary arterial hypertension. 3.  Emphysema  4.  Aortic atherosclerosis   5/4 CTH >> Minimal frontal and parietal lobe atrophy.  Otherwise negative exam.  Micro Data:  5/4 COVID >> neg 5/4 BC x 2 >> 5/4 MRSA PCR >> neg  Antimicrobials:  5/4 vanc  5/4 cefepime  Interim history/subjective:  Arrived on propofol gtt.  Objective   Blood pressure 106/70, pulse 78, temperature 98.9 F (37.2 C), temperature source Oral, resp. rate 16, height 5\' 8"  (1.727 m), weight 51.6 kg, SpO2 100 %.    Vent Mode: PRVC FiO2 (%):  [60 %-100 %] 60 % Set Rate:  [16 bmp] 16 bmp Vt Set:  [510 mL] 510 mL PEEP:  [5 cmH20] 5 cmH20 Plateau Pressure:  [26 cmH20-28 cmH20] 26 cmH20   Intake/Output Summary (Last 24 hours) at 09/21/2018 2155 Last data filed at 09/21/2018 1441 Gross per 24 hour  Intake 107.38 ml  Output --  Net 107.38 ml   Filed Weights   09/21/18 2130  Weight: 51.6 kg   Examination: General:  Thin older female sedated on MV in NAD HEENT: MM pink/moist, ETT, OGT, pupils 3/reactive, anicteric, +JVD Neuro: sedated s/p fentanyl, MAE- not f/c CV: RR, no murmur PULM: even/non-labored, lungs bilaterally clear no wheeze GI: thin, no fluid wave, soft, non-tender, bs active, foley Extremities: warm/dry, 3-4+ LE edema, right leg slightly more than left Skin: no  rashes   Resolved Hospital Problem list    Assessment & Plan:   Acute hypoxic/ hypercarbic respiratory failure- possibly multifactorial, evidence of acute volume overload, emphysematous changes, Mod R / small L pleural effusion, and R > L atelectasis Tobacco abuse P:  Full MV support, PRVC 8 cc/kg, rate 16 CXR and ABG now VAP bundle duonebs q 6 and prn  Defer steroids- not bronchospastic at this time Daily SBT trials Diuresis as below Will need further outpatient pulmonary f/u /testing and tobacco cessation counseling Doubtful for PE, unable to do CTA PE, will obtain LE dopplers   Suspected acute decompensated HF given mildly elevated troponin, elevated BNP, and hypervolemia P:  Tele monitoring Trend troponin/ EKG q 6 x 2 TTE in am  S/p lasix - repeating BMP now, consider additional diuresis as BP/ renal tolerated  Hyponatremia- hypervolemic  AKI  Mild hyperkalemia P:  S/p lasix Continue foley  Repeat BMP now Trend BMP / mag/ phos/  urinary output Replace electrolytes as indicated Avoid nephrotoxic agents, ensure adequate renal perfusion  Transaminitis - unclear etiology ? R/t HF, no sign ETOH hx  - tylenol level neg - CT abd as above, showing ascites  P:  Check coags, HCV, hepatitis panel Repeat LFTs in am   Leukocytosis  - UA neg, CT chest w/out evidence of infiltrates P:  Check PCT  Monitor clinically for now Trend WBC/ fever curve Follow BC  Acute encephalopathy - CTH neg, likely related to above P: Trend neuro exams, currently non-focal PAD protocol with propofol and prn fentanyl RASS goal 0/-1   Best practice:  Diet: NPO, if not extubated, start TF 5/5 Pain/Anxiety/Delirium protocol (if indicated): propofol/ prn fentanyl VAP protocol (if indicated): yes DVT prophylaxis: SCDS, heparin SQ GI prophylaxis: PPI Glucose control: CBG q4, add SSI if > 180 Mobility: BR Code Status: Full  Family Communication: Husband, [name], updated by phone.     Disposition: ICU  Labs   CBC: Recent Labs  Lab 09/21/18 1243 09/21/18 1251 09/21/18 1851 09/21/18 1906 09/21/18 2150  WBC 20.1*  --   --   --   --   NEUTROABS 17.1*  --   --   --   --   HGB 15.9* 19.0* 18.0* 17.7* 17.0*  HCT 53.0* 56.0* 53.0* 52.0* 50.0*  MCV 90.0  --   --   --   --   PLT 343  --   --   --   --     Basic Metabolic Panel: Recent Labs  Lab 09/21/18 1243 09/21/18 1251 09/21/18 1851 09/21/18 1906 09/21/18 2150  NA 126* 122* 123* 122* 126*  K 5.5* 7.4* 6.2* 5.1 4.6  CL 87*  --   --   --   --   CO2 23  --   --   --   --   GLUCOSE 109*  --   --   --   --   BUN 55*  --   --   --   --   CREATININE 1.92*  --   --   --   --  CALCIUM 8.1*  --   --   --   --    GFR: Estimated Creatinine Clearance: 24.7 mL/min (A) (by C-G formula based on SCr of 1.92 mg/dL (H)). Recent Labs  Lab 09/21/18 1243 09/21/18 1244 09/21/18 1516 09/21/18 1852  WBC 20.1*  --   --   --   LATICACIDVEN  --  3.7* 2.1* 1.6    Liver Function Tests: Recent Labs  Lab 09/21/18 1243  AST 563*  ALT 479*  ALKPHOS 84  BILITOT 1.3*  PROT 6.1*  ALBUMIN 3.6   No results for input(s): LIPASE, AMYLASE in the last 168 hours. No results for input(s): AMMONIA in the last 168 hours.  ABG    Component Value Date/Time   PHART 7.330 (L) 09/21/2018 2150   PCO2ART 56.4 (H) 09/21/2018 2150   PO2ART 87.0 09/21/2018 2150   HCO3 29.8 (H) 09/21/2018 2150   TCO2 31 09/21/2018 2150   ACIDBASEDEF 2.0 09/21/2018 1251   O2SAT 96.0 09/21/2018 2150     Coagulation Profile: No results for input(s): INR, PROTIME in the last 168 hours.  Cardiac Enzymes: Recent Labs  Lab 09/21/18 1244  TROPONINI 0.06*    HbA1C: No results found for: HGBA1C  CBG: No results for input(s): GLUCAP in the last 168 hours.  Review of Systems:   Unable   Past Medical History  She,  has a past medical history of Allergy.   Surgical History   History reviewed. No pertinent surgical history.   Social History    reports that she has been smoking. She has never used smokeless tobacco. She reports that she does not drink alcohol or use drugs.   Family History   Her family history includes Cancer in her mother.   Allergies Allergies  Allergen Reactions   Sulfa Antibiotics    Penicillins Rash     Home Medications  Prior to Admission medications   Medication Sig Start Date End Date Taking? Authorizing Provider  benzonatate (TESSALON) 100 MG capsule Take 1-2 capsules (100-200 mg total) by mouth 3 (three) times daily as needed. 03/23/14   Posey Boyer, MD  cefdinir (OMNICEF) 300 MG capsule Take 2 capsules (600 mg total) by mouth daily. 03/23/14   Posey Boyer, MD     Critical care time: 75 mins    Kennieth Rad, MSN, AGACNP-BC [facility] Pgr: [phone] or if no answer [phone] 09/21/2018, 11:25 PM   Attending MD note  Patient was independently seen and examined, treatment plan was discussed with the  Advance Practice Provider. I agree with the above note by Kennieth Rad.  I have personally reviewed the clinical findings, labs, ECG, imaging studies and management of this patient in detail. I have also reviewed the orders written for this patient which were under my direction. I agree with the documentation, as recorded by the Advance Practice Provider.  63 year old woman with hypoxic hypercarbic respiratory failure, emphysema by imaging, R sided partially loculated effusion.   Agree with management plan above.  My cc time 45 minutes  This patient is critically ill, requiring high complexity decision making for assessment and plan, frequent evaluation, application of advanced monitoring and extensive interpretations of multiple databases.     Collier Bullock, MD

## 2018-09-22 ENCOUNTER — Inpatient Hospital Stay (HOSPITAL_COMMUNITY): Payer: BC Managed Care – PPO

## 2018-09-22 DIAGNOSIS — N179 Acute kidney failure, unspecified: Secondary | ICD-10-CM | POA: Diagnosis present

## 2018-09-22 DIAGNOSIS — M7989 Other specified soft tissue disorders: Secondary | ICD-10-CM

## 2018-09-22 DIAGNOSIS — J9601 Acute respiratory failure with hypoxia: Secondary | ICD-10-CM

## 2018-09-22 DIAGNOSIS — I2729 Other secondary pulmonary hypertension: Secondary | ICD-10-CM | POA: Diagnosis present

## 2018-09-22 DIAGNOSIS — J90 Pleural effusion, not elsewhere classified: Secondary | ICD-10-CM | POA: Diagnosis present

## 2018-09-22 LAB — HEPATIC FUNCTION PANEL
ALT: 683 U/L — ABNORMAL HIGH (ref 0–44)
AST: 676 U/L — ABNORMAL HIGH (ref 15–41)
Albumin: 2.8 g/dL — ABNORMAL LOW (ref 3.5–5.0)
Alkaline Phosphatase: 68 U/L (ref 38–126)
Bilirubin, Direct: 0.5 mg/dL — ABNORMAL HIGH (ref 0.0–0.2)
Indirect Bilirubin: 1.3 mg/dL — ABNORMAL HIGH (ref 0.3–0.9)
Total Bilirubin: 1.8 mg/dL — ABNORMAL HIGH (ref 0.3–1.2)
Total Protein: 4.4 g/dL — ABNORMAL LOW (ref 6.5–8.1)

## 2018-09-22 LAB — POCT I-STAT EG7
Acid-base deficit: 2 mmol/L (ref 0.0–2.0)
Bicarbonate: 29.4 mmol/L — ABNORMAL HIGH (ref 20.0–28.0)
Calcium, Ion: 0.95 mmol/L — ABNORMAL LOW (ref 1.15–1.40)
HCT: 56 % — ABNORMAL HIGH (ref 36.0–46.0)
Hemoglobin: 19 g/dL — ABNORMAL HIGH (ref 12.0–15.0)
O2 Saturation: 90 %
Potassium: 7.4 mmol/L (ref 3.5–5.1)
Sodium: 122 mmol/L — ABNORMAL LOW (ref 135–145)
TCO2: 32 mmol/L (ref 22–32)
pCO2, Ven: 74.5 mmHg (ref 44.0–60.0)
pH, Ven: 7.204 — ABNORMAL LOW (ref 7.250–7.430)
pO2, Ven: 75 mmHg — ABNORMAL HIGH (ref 32.0–45.0)

## 2018-09-22 LAB — CBC
HCT: 45.7 % (ref 36.0–46.0)
Hemoglobin: 15 g/dL (ref 12.0–15.0)
MCH: 27.5 pg (ref 26.0–34.0)
MCHC: 32.8 g/dL (ref 30.0–36.0)
MCV: 83.7 fL (ref 80.0–100.0)
Platelets: 253 10*3/uL (ref 150–400)
RBC: 5.46 MIL/uL — ABNORMAL HIGH (ref 3.87–5.11)
RDW: 13.9 % (ref 11.5–15.5)
WBC: 24.1 10*3/uL — ABNORMAL HIGH (ref 4.0–10.5)
nRBC: 0.5 % — ABNORMAL HIGH (ref 0.0–0.2)

## 2018-09-22 LAB — GLUCOSE, CAPILLARY
Glucose-Capillary: 86 mg/dL (ref 70–99)
Glucose-Capillary: 81 mg/dL (ref 70–99)
Glucose-Capillary: 74 mg/dL (ref 70–99)
Glucose-Capillary: 81 mg/dL (ref 70–99)
Glucose-Capillary: 86 mg/dL (ref 70–99)

## 2018-09-22 LAB — PROCALCITONIN: Procalcitonin: 0.32 ng/mL

## 2018-09-22 LAB — BASIC METABOLIC PANEL
Anion gap: 14 (ref 5–15)
Anion gap: 21 — ABNORMAL HIGH (ref 5–15)
BUN: 54 mg/dL — ABNORMAL HIGH (ref 8–23)
BUN: 54 mg/dL — ABNORMAL HIGH (ref 8–23)
CO2: 28 mmol/L (ref 22–32)
CO2: 22 mmol/L (ref 22–32)
Calcium: 7.7 mg/dL — ABNORMAL LOW (ref 8.9–10.3)
Calcium: 7.8 mg/dL — ABNORMAL LOW (ref 8.9–10.3)
Chloride: 89 mmol/L — ABNORMAL LOW (ref 98–111)
Chloride: 88 mmol/L — ABNORMAL LOW (ref 98–111)
Creatinine, Ser: 1.54 mg/dL — ABNORMAL HIGH (ref 0.44–1.00)
Creatinine, Ser: 1.36 mg/dL — ABNORMAL HIGH (ref 0.44–1.00)
GFR calc Af Amer: 41 mL/min — ABNORMAL LOW (ref >60)
GFR calc Af Amer: 48 mL/min — ABNORMAL LOW (ref >60)
GFR calc non Af Amer: 36 mL/min — ABNORMAL LOW (ref >60)
GFR calc non Af Amer: 42 mL/min — ABNORMAL LOW (ref >60)
Glucose, Bld: 86 mg/dL (ref 70–99)
Glucose, Bld: 74 mg/dL (ref 70–99)
Potassium: 5 mmol/L (ref 3.5–5.1)
Potassium: 4.8 mmol/L (ref 3.5–5.1)
Sodium: 131 mmol/L — ABNORMAL LOW (ref 135–145)
Sodium: 131 mmol/L — ABNORMAL LOW (ref 135–145)

## 2018-09-22 LAB — MAGNESIUM: Magnesium: 1.7 mg/dL (ref 1.7–2.4)

## 2018-09-22 LAB — TROPONIN I
Troponin I: 0.09 ng/mL (ref <0.03)
Troponin I: 0.08 ng/mL (ref <0.03)

## 2018-09-22 LAB — ECHOCARDIOGRAM LIMITED
Height: 68 in
Weight: 1823.65 oz

## 2018-09-22 LAB — PHOSPHORUS: Phosphorus: 5.9 mg/dL — ABNORMAL HIGH (ref 2.5–4.6)

## 2018-09-22 LAB — TRIGLYCERIDES: Triglycerides: 91 mg/dL (ref <150)

## 2018-09-22 LAB — HIV ANTIBODY (ROUTINE TESTING W REFLEX): HIV Screen 4th Generation wRfx: NONREACTIVE

## 2018-09-22 LAB — BRAIN NATRIURETIC PEPTIDE: B Natriuretic Peptide: 397.2 pg/mL — ABNORMAL HIGH (ref 0.0–100.0)

## 2018-09-22 MED ORDER — PHYTONADIONE 5 MG PO TABS
10.0000 mg | ORAL_TABLET | Freq: Once | ORAL | Status: AC
  Administered 2018-09-22: 10 mg via ORAL
  Filled 2018-09-22: qty 2

## 2018-09-22 MED ORDER — ORAL CARE MOUTH RINSE
15.0000 mL | Freq: Two times a day (BID) | OROMUCOSAL | Status: DC
  Administered 2018-09-22 – 2018-09-25 (×6): 15 mL via OROMUCOSAL

## 2018-09-22 MED ORDER — FUROSEMIDE 10 MG/ML IJ SOLN
40.0000 mg | INTRAMUSCULAR | Status: AC
  Administered 2018-09-22: 15:00:00 40 mg via INTRAVENOUS
  Filled 2018-09-22: qty 4

## 2018-09-22 MED ORDER — IPRATROPIUM-ALBUTEROL 0.5-2.5 (3) MG/3ML IN SOLN
3.0000 mL | Freq: Four times a day (QID) | RESPIRATORY_TRACT | Status: DC
  Administered 2018-09-22: 3 mL via RESPIRATORY_TRACT
  Filled 2018-09-22: qty 3

## 2018-09-22 NOTE — Progress Notes (Signed)
RT note: attempted SBT on patient this AM of CPAP/PSV of 15/5 at 0750.  Patient took a couple of breaths then went apneic and back up ventilation alarmed.  Placed back on full support ventilator settings.  Currently tolerating well.  Will continue to monitor.

## 2018-09-22 NOTE — Progress Notes (Signed)
Bilateral lower extremity venous duplex has been completed. Preliminary results can be found in CV Proc through chart review.   09/22/18 10:09 AM Evelyn Hughes RVT

## 2018-09-22 NOTE — Progress Notes (Signed)
RT called to patient room due to patient self extubating.  Sats are currently 95% on 4L nasal cannula.  Patient is able to speak at this time, apologizing for self extubating.  Vitals are stable.  No stridor noted.  Will continue to monitor.

## 2018-09-22 NOTE — Progress Notes (Signed)
CRITICAL VALUE ALERT  Critical Value:  Troponin 0.09   Date & Time Notied:  5/5 0017  Provider Notified: E-link MD  Orders Received/Actions taken: Awaiting orders

## 2018-09-22 NOTE — Progress Notes (Signed)
  Echocardiogram 2D Echocardiogram has been performed.  Evelyn Hughes 09/22/2018, 2:16 PM

## 2018-09-22 NOTE — Progress Notes (Signed)
NEW ADMISSION NOTE New Admission Note:   Arrival Method:  Patient arrived in bed from 82M. Mental Orientation: Alert x 2, disoriented to time and situation. Telemetry: 39M-04, NSR. Assessment: Completed Skin:  Stage 1 on the left buttock. IV: R AC and L H NSL. Pain:  Denies any pain. Tubes: N/A Safety Measures: Safety Fall Prevention Plan has been given, discussed and signed Admission: Completed [facility] [place] Orientation: Patient has been orientated to the room, unit and staff.  Family:  Orders have been reviewed and implemented. Will continue to monitor the patient. Call light has been placed within reach and bed alarm has been activated.   Amaryllis Dyke, RN

## 2018-09-22 NOTE — Progress Notes (Addendum)
NAME:  Evelyn Hughes, MRN:  [id number], DOB:  28-Aug-1955, LOS: 1 ADMISSION DATE:  09/21/2018, CONSULTATION DATE:  09/21/2018 REFERRING MD:  Dr. Billy Fischer, CHIEF COMPLAINT:  Respiratory failure  Brief History   63 yoF from home with no known PMH other than tobacco abuse presenting with progressive hx of fatigue, SOB, and leg swelling and new confusion.  Hypoxic failing HFNC and BIPAP requiring intubation.  Additionally noted to have AKI, Transaminitis, and evidence of volume overload concerning for acute decompensated heart failure.    Significant Hospital Events   5/4 admit lasix started working dx decompensated HF w/ large right effusion BNP > 2K 5/5: more awake. Tolerating SBT.   Consults:   Procedures:  5/4 ETT >>  Significant Diagnostic Tests:  5/4 CT abd/ pelvis >>1.  No acute intra-abdominal process.2. Small ascites and mild anasarca.  5/4 CT chest >> 1. Moderate right and small left pleural effusions.  No pneumonia. 2. Mild right heart enlargement with dilated main pulmonary artery, suggestive of pulmonary arterial hypertension.3.  Emphysema 4.  Aortic atherosclerosis   5/4 CTH >>Minimal frontal and parietal lobe atrophy.  Otherwise negative exam. Echo 4/5>>> Micro Data:  5/4 COVID >> neg 5/4 BC x 2 >> 5/4 MRSA PCR >> neg  Antimicrobials:  5/4 vanc X 1 5/4 [id number]  Interim history/subjective:  Awake and no distress.   Objective   Blood pressure 93/61, pulse 96, temperature 99.3 F (37.4 C), temperature source Oral, resp. rate 16, height 5\' 8"  (1.727 m), weight 51.7 kg, SpO2 100 %.    Vent Mode: PRVC FiO2 (%):  [40 %-100 %] 40 % Set Rate:  [16 bmp] 16 bmp Vt Set:  [510 mL] 510 mL PEEP:  [5 cmH20] 5 cmH20 Plateau Pressure:  [17 cmH20-28 cmH20] 17 cmH20   Intake/Output Summary (Last 24 hours) at 09/22/2018 1009 Last data filed at 09/22/2018 0600 Gross per 24 hour  Intake 181.34 ml  Output 455 ml  Net -273.66 ml   Filed Weights   09/21/18 2130 09/22/18  0438  Weight: 51.6 kg 51.7 kg   Examination:  General frail 63 year old female, chronically ill appearing. Anxious but not in distress HENT NCAT no JVD orally intubated Pulm scattered rhonchi w/ faint exp wheeze decreased on right  Card RRR w/out MRG abd not tender + bowel sounds Ext brisk CR, warm good pulses. BLE edema Neuro awake interactive tries to communicate. Moves all ext. Not sure of orientation.   Resolved Hospital Problem list    Assessment & Plan:   Acute hypoxic/ hypercarbic respiratory failure- presumed multifactorial:  acute volume overload w/ pulmonary edema superimposed on  emphysematous changes, Mod R / small L pleural effusion, and R > L atelectasis Tobacco abuse pcxr review w/ rather large sized right effusion and bilateral atx  Failed sbt attempt initially this am d/t sedation-->now looks good.  Negative < 300 ml thus far Plan SBT and assess for extubation.  F/u cxr this am VAP bundle Cont duonebs Cont lasix  F/u LE [place] to ensure no DVT Repeat cxr in am and consider right thora    Suspected acute decompensated HF given mildly elevated troponin, elevated BNP, and hypervolemia -trops flat Plan Cont tele F/u TEE Diuresis as able   Fluid and electrolyte imbalance: Hyponatremia- hypervolemia  Plan Repeat chemistry today   AKI Plan Repeat chem  Cont lasix as BUN and cr allow   Transaminitis-->looking worse  - unclear etiology ? R/t HF, no sign ETOH hx ->favor passive  liver congestion  - tylenol level neg - CT abd as above, showing ascites but otherwise neg   plan:  Await hepatitis panel Repeat am Consider repeat US RUQ and GI consult if cont to rise on 5/6  Mild coagulopathy  Plan Trend coags Vit K X 1 10 mg [place]   Leukocytosis  - UA neg, CT chest w/out evidence of infiltrates; PCT neg  plan:  Trend fever curve Trend cbc  Acute metabolic encephalopathy- CTH neg, likely related to a hypercarbia  Plan: PAD protocol w/ RASS goal 0 to -1  Supportive care    Best practice:  Diet: NPO,  Pain/Anxiety/Delirium protocol (if indicated): propofol/ prn fentanyl-->stop 5/5 VAP protocol (if indicated): yes stop 5/5>>> DVT prophylaxis: SCDS, heparin SQ GI prophylaxis: PPI Glucose control: CBG q4, add SSI if > 180 Mobility: BR Code Status: Full  Family Communication: Husband, [name], updated by phone.   Disposition: ICU Critically ill but weaning and titrating ventilatory support. I think she will come off vent today. Needs further echo and may need HF team. Will see what xray looks like in am. May need thora.   Critical care time: 66m    Erick Colace ACNP-BC [place] Pager # [phone] OR # [phone] if no answer

## 2018-09-23 ENCOUNTER — Inpatient Hospital Stay (HOSPITAL_COMMUNITY): Payer: BC Managed Care – PPO

## 2018-09-23 DIAGNOSIS — L89321 Pressure ulcer of left buttock, stage 1: Secondary | ICD-10-CM

## 2018-09-23 LAB — HEPATIC FUNCTION PANEL
ALT: 480 U/L — ABNORMAL HIGH (ref 0–44)
AST: 255 U/L — ABNORMAL HIGH (ref 15–41)
Albumin: 2.4 g/dL — ABNORMAL LOW (ref 3.5–5.0)
Alkaline Phosphatase: 56 U/L (ref 38–126)
Bilirubin, Direct: 0.5 mg/dL — ABNORMAL HIGH (ref 0.0–0.2)
Indirect Bilirubin: 1.2 mg/dL — ABNORMAL HIGH (ref 0.3–0.9)
Total Bilirubin: 1.7 mg/dL — ABNORMAL HIGH (ref 0.3–1.2)
Total Protein: 4 g/dL — ABNORMAL LOW (ref 6.5–8.1)

## 2018-09-23 LAB — BASIC METABOLIC PANEL
Anion gap: 12 (ref 5–15)
BUN: 27 mg/dL — ABNORMAL HIGH (ref 8–23)
CO2: 35 mmol/L — ABNORMAL HIGH (ref 22–32)
Calcium: 7.4 mg/dL — ABNORMAL LOW (ref 8.9–10.3)
Chloride: 88 mmol/L — ABNORMAL LOW (ref 98–111)
Creatinine, Ser: 0.83 mg/dL (ref 0.44–1.00)
GFR calc Af Amer: >60 mL/min (ref >60)
GFR calc non Af Amer: >60 mL/min (ref >60)
Glucose, Bld: 73 mg/dL (ref 70–99)
Potassium: 3.1 mmol/L — ABNORMAL LOW (ref 3.5–5.1)
Sodium: 135 mmol/L (ref 135–145)

## 2018-09-23 LAB — HEPATITIS PANEL, ACUTE
HCV Ab: <0.1 s/co ratio (ref 0.0–0.9)
Hep A IgM: NEGATIVE
Hep B C IgM: NEGATIVE
Hepatitis B Surface Ag: NEGATIVE

## 2018-09-23 LAB — BRAIN NATRIURETIC PEPTIDE: B Natriuretic Peptide: 339.1 pg/mL — ABNORMAL HIGH (ref 0.0–100.0)

## 2018-09-23 MED ORDER — PHYTONADIONE 5 MG PO TABS
10.0000 mg | ORAL_TABLET | Freq: Once | ORAL | Status: AC
  Administered 2018-09-23: 10 mg via ORAL
  Filled 2018-09-23: qty 2

## 2018-09-23 MED ORDER — ADULT MULTIVITAMIN W/MINERALS CH
1.0000 | ORAL_TABLET | Freq: Every day | ORAL | Status: DC
  Administered 2018-09-23 – 2018-09-25 (×3): 1 via ORAL
  Filled 2018-09-23 (×3): qty 1

## 2018-09-23 MED ORDER — IPRATROPIUM-ALBUTEROL 0.5-2.5 (3) MG/3ML IN SOLN
3.0000 mL | Freq: Three times a day (TID) | RESPIRATORY_TRACT | Status: DC
  Administered 2018-09-23 – 2018-09-25 (×6): 3 mL via RESPIRATORY_TRACT
  Filled 2018-09-23 (×6): qty 3

## 2018-09-23 MED ORDER — BUDESONIDE 0.25 MG/2ML IN SUSP
0.2500 mg | Freq: Two times a day (BID) | RESPIRATORY_TRACT | Status: DC
  Administered 2018-09-24 – 2018-09-25 (×3): 0.25 mg via RESPIRATORY_TRACT
  Filled 2018-09-23 (×3): qty 2

## 2018-09-23 MED ORDER — ENSURE ENLIVE PO LIQD
237.0000 mL | Freq: Two times a day (BID) | ORAL | Status: DC
  Administered 2018-09-23 – 2018-09-25 (×3): 237 mL via ORAL

## 2018-09-23 MED ORDER — FUROSEMIDE 10 MG/ML IJ SOLN
40.0000 mg | INTRAMUSCULAR | Status: AC
  Administered 2018-09-23: 40 mg via INTRAVENOUS
  Filled 2018-09-23: qty 4

## 2018-09-23 NOTE — Evaluation (Signed)
Physical Therapy Evaluation Patient Details Name: CARNELIA OSCAR MRN: [id number] DOB: 06/16/1955 Today's Date: 09/23/2018   History of Present Illness  72 yoF from home with no known PMH other than tobacco abuse presenting with progressive hx of fatigue, SOB, and leg swelling and new confusion.  Hypoxic failing HFNC and BIPAP requiring intubation.  Additionally noted to have AKI, Transaminitis, and evidence of volume overload concerning for acute decompensated heart failure.     Clinical Impression  Pt admitted with above diagnosis. Pt currently with functional limitations due to the deficits listed below (see PT Problem List). PTA, pt reports independence with mobility living at home with her husband. [place], ambulating with contact guard, de sat to 80% on RA in 40'. Pt reports cognition not at baseline, with slow processing noted. Anticipate  she will progress well in subsequent PT visits.  Pt will benefit from skilled PT to increase their independence and safety with mobility to allow discharge to the venue listed below.       Follow Up Recommendations Home health PT;Supervision/Assistance - 24 hour    Equipment Recommendations  (TBD pending progress)    Recommendations for Other Services       Precautions / Restrictions Precautions Precautions: Fall Restrictions Weight Bearing Restrictions: No      Mobility  Bed Mobility Overal bed mobility: Modified Independent                Transfers Overall transfer level: Modified independent                  Ambulation/Gait Ambulation/Gait assistance: Min guard Gait Distance (Feet): 40 Feet Assistive device: None Gait Pattern/deviations: Step-through pattern Gait velocity: decreased   General Gait Details: pt with unsteady gait, reports not her baseline. ambulating 40' on RA desat to 80%, returns back on 4L wall O2  Stairs            Wheelchair Mobility    Modified Rankin (Stroke Patients Only)        Balance Overall balance assessment: Needs assistance   Sitting balance-Leahy Scale: Good       Standing balance-Leahy Scale: Fair                               Pertinent Vitals/Pain Pain Assessment: No/denies pain    Home Living Family/patient expects to be discharged to:: Private residence Living Arrangements: Alone Available Help at Discharge: Family;Available 24 hours/day Type of Home: House Home Access: Level entry     Home Layout: One level Home Equipment: None      Prior Function Level of Independence: Independent         Comments: drives, I at baseline     Hand Dominance   Dominant Hand: Right    Extremity/Trunk Assessment   Upper Extremity Assessment Upper Extremity Assessment: Generalized weakness    Lower Extremity Assessment Lower Extremity Assessment: Generalized weakness       Communication   Communication: No difficulties  Cognition Arousal/Alertness: Awake/alert Behavior During Therapy: Flat affect Overall Cognitive Status: Impaired/Different from baseline                                 General Comments: pt reports her memory is not at baseline, slow processing observed, did not know it was may. "i am way more confused than normal toady".       General Comments  Exercises     Assessment/Plan    PT Assessment Patient needs continued PT services  PT Problem List Decreased strength       PT Treatment Interventions DME instruction;Gait training;Stair training;Functional mobility training;Therapeutic activities;Balance training;Therapeutic exercise;Neuromuscular re-education    PT Goals (Current goals can be found in the Care Plan section)  Acute Rehab PT Goals Patient Stated Goal: to go home PT Goal Formulation: With patient Time For Goal Achievement: 10/07/18 Potential to Achieve Goals: Good    Frequency Min 3X/week   Barriers to discharge        Co-evaluation                AM-PAC PT "6 Clicks" Mobility  Outcome Measure Help needed turning from your back to your side while in a flat bed without using bedrails?: None Help needed moving from lying on your back to sitting on the side of a flat bed without using bedrails?: None Help needed moving to and from a bed to a chair (including a wheelchair)?: A Little Help needed standing up from a chair using your arms (e.g., wheelchair or bedside chair)?: A Little Help needed to walk in hospital room?: A Little Help needed climbing 3-5 steps with a railing? : A Lot 6 Click Score: 19    End of Session Equipment Utilized During Treatment: Gait belt Activity Tolerance: Patient tolerated treatment well Patient left: in bed;with call bell/phone within reach;with bed alarm set Nurse Communication: Mobility status PT Visit Diagnosis: Unsteadiness on feet (R26.81)    Time: 0370-4888 PT Time Calculation (min) (ACUTE ONLY): 27 min   Charges:   PT Evaluation $PT Eval Low Complexity: 1 Low PT Treatments $Gait Training: 8-22 mins        Reinaldo Berber, PT, DPT Acute Rehabilitation Services Pager: [phone] Office: [place] 09/23/2018, 3:58 PM

## 2018-09-23 NOTE — Progress Notes (Signed)
Initial Nutrition Assessment  DOCUMENTATION CODES:   Underweight  INTERVENTION:    Ensure Enlive po BID, each supplement provides 350 kcal and 20 grams of protein  MVI daily  NUTRITION DIAGNOSIS:   Increased nutrient needs related to acute illness as evidenced by estimated needs.  GOAL:   Patient will meet greater than or equal to 90% of their needs   MONITOR:   Supplement acceptance, Diet advancement, Weight trends, Labs, Skin, I & O's, PO intake  REASON FOR ASSESSMENT:   Other (Comment)(Low BMI)    ASSESSMENT:   Patient with PMH significant for tobacco abuse. Presents this admission with acute decompensation CHF with underlying COPD.    5/5- self extubated   RD working remotely.  Spoke with pt via phone. Denies having a loss in appetite PTA. States she typically eats 1.5 meals daily that consist of different restaurant food options (unable to specify meal types). Meal completions charted as 100% for pt's last meal. Pt unhappy about about being on a sodium restriction. Diet was changed to soft this am. Discussed the importance of protein intake for preservation of lean body mass. Pt has never tried supplementation but amendable to Ensure this stay.   Pt reports a UBW of 100 lb and denies unintentional wt loss. Records are limited in wt history but show pt weighed 44.1 kg in 2015. Suspect pt could be chronically malnourished but unable to diagnosis with lack of evidence.   I/O: -2,229 ml since admit UOP: 2,425 ml x 24 hrs   Medications: Vit K Labs: K 3.1 (L) elevated LFTs   Diet Order:   Diet Order            DIET SOFT Room service appropriate? Yes; Fluid consistency: Thin  Diet effective now              EDUCATION NEEDS:   Education needs have been addressed  Skin:  Skin Assessment: Skin Integrity Issues: Skin Integrity Issues:: Stage I, Other (Comment) Stage I: buttocks  Other: MASD- medil/lower back   Last BM:  5/5  Height:   Ht Readings from  Last 1 Encounters:  09/21/18 5\' 8"  (1.727 m)    Weight:   Wt Readings from Last 1 Encounters:  09/22/18 51.7 kg    Ideal Body Weight:  63.6 kg  BMI:  Body mass index is 17.33 kg/m.  Estimated Nutritional Needs:   Kcal:  1550-1750 kcal  Protein:  75-90 grams  Fluid:  >/= 1.5 L/day   Mariana Single RD, LDN Clinical Nutrition Pager # - [phone]

## 2018-09-23 NOTE — Progress Notes (Addendum)
PROGRESS NOTE  Evelyn Hughes QQP:[id number] DOB: 1956-02-14 DOA: 09/21/2018 PCP: Patient, No Pcp Per   LOS: 2 days   Brief narrative: 63 year old female with no documented past medical history but not under recent medical care, presented to the hospital with dyspnea, lower extremity edema, encephalopathy and hypoxic respiratory failure.  She also had acute renal failure and decompensated CHF on presentation with volume overload.   Chest x-ray showed hyperinflation and profound emphysematous changes, infiltrates with a large right pleural effusion, BNP 2000.  She received antibiotics x1, discontinued in absence of clear evidence for pneumonia.  Patient was intubated for hypoxic respiratory failure despite being on high flow nasal cannula and BiPAP.  Patient frequently, self extubated and has tolerated nasal cannula well.  Patient was then considered for transfer to medical service from the ICU.    Subjective: Denies interval complaints.  Denies any chest pain, palpitation, fever chest pain or nausea/vomiting.  Has some cough.  Assessment/Plan:  Active Problems:   Acute respiratory failure with hypoxia and hypercapnia (HCC)   Acute respiratory failure (HCC)   Pleural effusion on right   Other secondary pulmonary hypertension (HCC)   Acute renal failure (HCC)   Stage I pressure ulcer of left buttock   acute diastolic HF with  underlying COPD -trops flat.  Continue oxygen nebulizers.  Will give 1 dose of Lasix again today.  2D echocardiogram reviewed with preserved LV function of 55 to 60% with estimated right ventricular systolic pressure of 46 mmHg.  Currently on 4 L of nasal cannula.  Might need home oxygen on discharge.  Will provide incentive spirometry, pulmonary hygiene therapy.   Hypervolemic hyponatremia.  Improved with hydration.  Sodium of 135 today.  We will continue with Lasix today.    Acute kidney injury on presentation.  This has improved with diuresis.  We will continue 1 dose  of IV Lasix today.    Elevated LFTs.  Likely hepatic congestion from right-sided heart failure.  CT scan showing ascites and right-sided pleural effusion.  Check LFTs in a.m.  Mild coagulopathy  Received 1 dose of vitamin K.  Will check INR in a.m.  No Evidence of bleeding.  Significant leukocytosis  No obvious source of infection.  CT chest without infiltrate.  Procalcitonin was negative.  Urinalysis was negative for infection.  Continue to follow CBC.  Acute metabolic encephalopathy-this has improved.  Likely secondary to hypercarbic/hypoxic respiratory failure.    Stage I pressure ulceration of the left buttocks present on admission.  Continue pressure ulcer prevention protocol   VTE Prophylaxis:  SCD now.  Check INR in a.m.  Code Status: Full code.  Family Communication: None  Disposition Plan: Home likely in 2 to 3 days.  States that she lives with her husband at home.  We will get physical therapy evaluation.   Consultants:  PCCM  Procedures:  Endotracheal intubation and mechanical ventilation on 09/21/2018  Antibiotics: Anti-infectives (From admission, onward)   Start     Dose/Rate Route Frequency Ordered Stop   09/21/18 1345  vancomycin (VANCOCIN) IVPB 1000 mg/200 mL premix     1,000 mg 200 mL/hr over 60 Minutes Intravenous  Once 09/21/18 1333 09/21/18 1452   09/21/18 1345  ceFEPIme (MAXIPIME) 2 g in sodium chloride 0.9 % 100 mL IVPB     2 g 200 mL/hr over 30 Minutes Intravenous  Once 09/21/18 1333 09/21/18 1440   09/21/18 1341  ceFEPIme (MAXIPIME) 2 g injection    Note to Pharmacy:  Sandra Cockayne   :  cabinet override      09/21/18 1341 09/22/18 0144   09/21/18 1330  cefTRIAXone (ROCEPHIN) 1 g in sodium chloride 0.9 % 100 mL IVPB  Status:  Discontinued     1 g 200 mL/hr over 30 Minutes Intravenous  Once 09/21/18 1320 09/21/18 1333   09/21/18 1330  azithromycin (ZITHROMAX) 500 mg in sodium chloride 0.9 % 250 mL IVPB  Status:  Discontinued     500 mg 250 mL/hr  over 60 Minutes Intravenous  Once 09/21/18 1320 09/21/18 1333   09/21/18 1326  azithromycin (ZITHROMAX) 500 MG injection    Note to Pharmacy:  Sandra Cockayne   : cabinet override      09/21/18 1326 09/22/18 0129       Objective: Vitals:   09/23/18 0755 09/23/18 0828  BP:  (!) 96/56  Pulse:  94  Resp:  18  Temp:  98.3 F (36.8 C)  SpO2: 98% 95%    Intake/Output Summary (Last 24 hours) at 09/23/2018 1348 Last data filed at 09/23/2018 1300 Gross per 24 hour  Intake 1320 ml  Output 2625 ml  Net -1305 ml   Filed Weights   09/21/18 2130 09/22/18 0438  Weight: 51.6 kg 51.7 kg   Body mass index is 17.33 kg/m.   Physical Exam: GENERAL: Patient is alert awake and communicative.  Not in obvious distress.  Appears ill, on nasal cannula. HENT: No scleral pallor or icterus. Pupils equally reactive to light. Oral mucosa is moist NECK: is supple, no palpable thyroid enlargement. CHEST:  Diminished breath sounds bilaterally.  Coarse breath sounds noted. CVS: S1 and S2 heard, no murmur. Regular rate and rhythm. No pericardial rub. ABDOMEN: Soft, non-tender, mildly distended abdomen, bowel sounds are present. No palpable hepato-splenomegaly. EXTREMITIES: Bilateral lower extremity edema noted CNS: Cranial nerves are intact. No focal motor or sensory deficits. SKIN: warm and dry without rashes.  Data Review: I have personally reviewed the following laboratory data and studies,  CBC: Recent Labs  Lab 09/21/18 1243 09/21/18 1251 09/21/18 1851 09/21/18 1906 09/21/18 2150 09/22/18 0600  WBC 20.1*  --   --   --   --  24.1*  NEUTROABS 17.1*  --   --   --   --   --   HGB 15.9* 19.0* 18.0* 17.7* 17.0* 15.0  HCT 53.0* 56.0* 53.0* 52.0* 50.0* 45.7  MCV 90.0  --   --   --   --  83.7  PLT 343  --   --   --   --  932   Basic Metabolic Panel: Recent Labs  Lab 09/21/18 1243  09/21/18 1906 09/21/18 2150 09/21/18 2310 09/22/18 0600 09/23/18 0445  NA 126*   < > 122* 126* 131* 131* 135  K  5.5*   < > 5.1 4.6 5.0 4.8 3.1*  CL 87*  --   --   --  89* 88* 88*  CO2 23  --   --   --  28 22 35*  GLUCOSE 109*  --   --   --  86 74 73  BUN 55*  --   --   --  54* 54* 27*  CREATININE 1.92*  --   --   --  1.54* 1.36* 0.83  CALCIUM 8.1*  --   --   --  7.7* 7.8* 7.4*  MG  --   --   --   --  1.7  --   --   PHOS  --   --   --   --  5.9*  --   --    < > = values in this interval not displayed.   Liver Function Tests: Recent Labs  Lab 09/21/18 1243 09/22/18 0600 09/23/18 0445  AST 563* 676* 255*  ALT 479* 683* 480*  ALKPHOS 84 68 56  BILITOT 1.3* 1.8* 1.7*  PROT 6.1* 4.4* 4.0*  ALBUMIN 3.6 2.8* 2.4*   No results for input(s): LIPASE, AMYLASE in the last 168 hours. No results for input(s): AMMONIA in the last 168 hours. Cardiac Enzymes: Recent Labs  Lab 09/21/18 1244 09/21/18 2310 09/22/18 0600  TROPONINI 0.06* 0.09* 0.08*   BNP (last 3 results) Recent Labs    09/21/18 1244 09/22/18 0600  BNP 2,229.0* 397.2*    ProBNP (last 3 results) No results for input(s): PROBNP in the last 8760 hours.  CBG: Recent Labs  Lab 09/21/18 2323 09/22/18 0350 09/22/18 0738 09/22/18 1128 09/22/18 1535  GLUCAP 82 81 74 81 86   Recent Results (from the past 240 hour(s))  Culture, blood (routine x 2)     Status: None (Preliminary result)   Collection Time: 09/21/18 12:44 PM  Result Value Ref Range Status   Specimen Description   Final    BLOOD LEFT HAND Performed at [facility] [address] [place], [place] [address]    Special Requests   Final    BOTTLES DRAWN AEROBIC AND ANAEROBIC Blood Culture adequate volume Performed at [facility], [address] [place], [place] [address]    Culture   Final    NO GROWTH 2 DAYS Performed at [facility], [place] [address] [place], [place] [address]    Report Status PENDING  Incomplete  Culture, blood (routine x 2)     Status: None (Preliminary result)   Collection Time: 09/21/18  1:01 PM  Result  Value Ref Range Status   Specimen Description   Final    RIGHT ANTECUBITAL Performed at [facility], [address] [place], [place] [address]    Special Requests   Final    BOTTLES DRAWN AEROBIC AND ANAEROBIC Blood Culture adequate volume Performed at [facility], [address] [place], [place] [address]    Culture   Final    NO GROWTH 2 DAYS Performed at [facility], [place] [address], [place], [place] [address]    Report Status PENDING  Incomplete  SARS Coronavirus 2 (Hosp order,Performed in [facility] lab via [employer])     Status: None   Collection Time: 09/21/18  1:01 PM  Result Value Ref Range Status   SARS Coronavirus 2 ([employer] Now) NEGATIVE NEGATIVE Final    Comment: (NOTE) Interpretive Result Comment(s): COVID 19 Positive SARS CoV 2 target nucleic acids are DETECTED. The SARS CoV 2 RNA is generally detectable in upper and lower respiratory specimens during the acute phase of infection.  Positive results are indicative of active infection with SARS CoV 2.  Clinical correlation with patient history and other diagnostic information is necessary to determine patient infection status.  Positive results do not rule out bacterial infection or coinfection with other viruses. The expected result is Negative. COVID 19 Negative SARS CoV 2 target nucleic acids are NOT DETECTED. The SARS CoV 2 RNA is generally detectable in upper and lower respiratory specimens during the acute phase of infection.  Negative results do not preclude SARS CoV 2 infection, do not rule out coinfections with other pathogens, and  should not be used as the sole basis for treatment or other patient management decisions.  Negative results must be combined with clinical  observations, patient history, and epidemiological information. The expected result is Negative. Invalid Presence or absence of SARS CoV 2 nucleic acids cannot be determined. Repeat testing was  performed on the submitted specimen and repeated Invalid results were obtained.  If clinically indicated, additional testing on a new specimen with an alternate test methodology [phone]) is advised.  The SARS CoV 2 RNA is generally detectable in upper and lower respiratory specimens during the acute phase of infection. The expected result is Negative. Fact Sheet for Patients:  GolfingFamily.no Fact Sheet for Healthcare Providers: https://www.hernandez-brewer.com/ This test is not yet approved or cleared by the [place] FDA and has been authorized for detection and/or diagnosis of SARS CoV 2 by FDA under an Emergency Use Authorization (EUA).  This EUA will remain in effect (meaning this test can be used) for the duration of the COVID19 d eclaration under Section 564(b)(1) of the Act, 21 U.S.C. section [phone] 3(b)(1), unless the authorization is terminated or revoked sooner. Performed at [facility], [address] [place], [place] [address]   MRSA PCR Screening     Status: None   Collection Time: 09/21/18  9:43 PM  Result Value Ref Range Status   MRSA by PCR NEGATIVE NEGATIVE Final    Comment:        The GeneXpert MRSA Assay (FDA approved for NASAL specimens only), is one component of a comprehensive MRSA colonization surveillance program. It is not intended to diagnose MRSA infection nor to guide or monitor treatment for MRSA infections. Performed at [facility], [place] [address], [place], [place] [address]      Studies: Ct Abdomen Pelvis Wo Contrast  Result Date: 09/21/2018 CLINICAL DATA:  Shortness of breath and bilateral leg swelling. EXAM: CT CHEST, ABDOMEN AND PELVIS WITHOUT CONTRAST TECHNIQUE: Multidetector CT imaging of the chest, abdomen and pelvis was performed following the standard protocol without IV contrast. COMPARISON:  Chest x-ray from same day. FINDINGS: CT CHEST FINDINGS Cardiovascular: Mild right  heart enlargement. No pericardial effusion. No thoracic aortic aneurysm. Coronary, aortic arch, and branch vessel atherosclerotic vascular disease. Dilated main pulmonary artery measuring up to 3.5 cm. Mediastinum/Nodes: No enlarged mediastinal, hilar, or axillary lymph nodes. Thyroid gland, trachea, and esophagus demonstrate no significant findings. Lungs/Pleura: Upper lobe predominant centrilobular and paraseptal emphysema. Moderate right and small left pleural effusions. Linear scarring/atelectasis in the right middle lobe. Right greater than left lower lobe atelectasis. No consolidation or pneumothorax. No suspicious pulmonary nodule. Musculoskeletal: No chest wall mass or suspicious bone lesions identified. CT ABDOMEN PELVIS FINDINGS Hepatobiliary: Scattered hepatic simple cysts measuring up to 5.0 cm. The gallbladder is contracted. No gallstones, gallbladder wall thickening, or biliary dilatation. Pancreas: Unremarkable. No pancreatic ductal dilatation or surrounding inflammatory changes. Spleen: Normal in size without focal abnormality. Adrenals/Urinary Tract: Adrenal glands are unremarkable. Kidneys are normal, without renal calculi, focal lesion, or hydronephrosis. Bladder is unremarkable. Stomach/Bowel: Stomach is within normal limits. Appendix appears normal. No evidence of bowel wall thickening, distention, or inflammatory changes. Vascular/Lymphatic: Aortic atherosclerosis. No enlarged abdominal or pelvic lymph nodes. Reproductive: Uterus and bilateral adnexa are unremarkable. Other: Small ascites. No pneumoperitoneum. No abdominal wall hernia. Musculoskeletal: Mild anasarca. No acute or significant osseous findings. IMPRESSION: Chest: 1. Moderate right and small left pleural effusions.  No pneumonia. 2. Mild right heart enlargement with dilated main pulmonary artery, suggestive of  pulmonary arterial hypertension. 3.  Emphysema (ICD10-J43.9). 4.  Aortic atherosclerosis (ICD10-I70.0). Abdomen and pelvis:  1.  No acute intra-abdominal process. 2. Small ascites and mild anasarca. Electronically Signed   By: Titus Dubin M.D.   On: 09/21/2018 15:38   Ct Head Wo Contrast  Result Date: 09/21/2018 CLINICAL DATA:  Altered level of consciousness. EXAM: CT HEAD WITHOUT CONTRAST TECHNIQUE: Contiguous axial images were obtained from the base of the skull through the vertex without intravenous contrast. COMPARISON:  None. FINDINGS: Brain: There is no evidence of acute infarction or hemorrhage or mass lesion. There is slight frontal and parietal lobe atrophy. No ventricular dilatation. Vascular: No hyperdense vessel or unexpected calcification. Skull: Normal. Negative for fracture or focal lesion. Sinuses/Orbits: Normal. Other: None IMPRESSION: Minimal frontal and parietal lobe atrophy.  Otherwise negative exam. Electronically Signed   By: Lorriane Shire M.D.   On: 09/21/2018 15:24   Ct Chest Wo Contrast  Result Date: 09/21/2018 CLINICAL DATA:  Shortness of breath and bilateral leg swelling. EXAM: CT CHEST, ABDOMEN AND PELVIS WITHOUT CONTRAST TECHNIQUE: Multidetector CT imaging of the chest, abdomen and pelvis was performed following the standard protocol without IV contrast. COMPARISON:  Chest x-ray from same day. FINDINGS: CT CHEST FINDINGS Cardiovascular: Mild right heart enlargement. No pericardial effusion. No thoracic aortic aneurysm. Coronary, aortic arch, and branch vessel atherosclerotic vascular disease. Dilated main pulmonary artery measuring up to 3.5 cm. Mediastinum/Nodes: No enlarged mediastinal, hilar, or axillary lymph nodes. Thyroid gland, trachea, and esophagus demonstrate no significant findings. Lungs/Pleura: Upper lobe predominant centrilobular and paraseptal emphysema. Moderate right and small left pleural effusions. Linear scarring/atelectasis in the right middle lobe. Right greater than left lower lobe atelectasis. No consolidation or pneumothorax. No suspicious pulmonary nodule. Musculoskeletal:  No chest wall mass or suspicious bone lesions identified. CT ABDOMEN PELVIS FINDINGS Hepatobiliary: Scattered hepatic simple cysts measuring up to 5.0 cm. The gallbladder is contracted. No gallstones, gallbladder wall thickening, or biliary dilatation. Pancreas: Unremarkable. No pancreatic ductal dilatation or surrounding inflammatory changes. Spleen: Normal in size without focal abnormality. Adrenals/Urinary Tract: Adrenal glands are unremarkable. Kidneys are normal, without renal calculi, focal lesion, or hydronephrosis. Bladder is unremarkable. Stomach/Bowel: Stomach is within normal limits. Appendix appears normal. No evidence of bowel wall thickening, distention, or inflammatory changes. Vascular/Lymphatic: Aortic atherosclerosis. No enlarged abdominal or pelvic lymph nodes. Reproductive: Uterus and bilateral adnexa are unremarkable. Other: Small ascites. No pneumoperitoneum. No abdominal wall hernia. Musculoskeletal: Mild anasarca. No acute or significant osseous findings. IMPRESSION: Chest: 1. Moderate right and small left pleural effusions.  No pneumonia. 2. Mild right heart enlargement with dilated main pulmonary artery, suggestive of pulmonary arterial hypertension. 3.  Emphysema (ICD10-J43.9). 4.  Aortic atherosclerosis (ICD10-I70.0). Abdomen and pelvis: 1.  No acute intra-abdominal process. 2. Small ascites and mild anasarca. Electronically Signed   By: Titus Dubin M.D.   On: 09/21/2018 15:38   Dg Chest Port 1 View  Result Date: 09/23/2018 CLINICAL DATA:  Leg swelling, shortness of breath EXAM: PORTABLE CHEST 1 VIEW COMPARISON:  09/21/2018 FINDINGS: Interval removal of previously seen endotracheal and esophagogastric tubes. Slightly increased atelectasis or consolidation at the right lung base with small right greater than left pleural effusions. Underlying hyperinflation and emphysema. Cardiomegaly. IMPRESSION: Interval removal of previously seen endotracheal and esophagogastric tubes. Slightly  increased atelectasis or consolidation at the right lung base with small right greater than left pleural effusions. Underlying hyperinflation and emphysema. Cardiomegaly. Electronically Signed   By: Eddie Candle M.D.   On: 09/23/2018 08:17  Dg Chest Port 1 View  Result Date: 09/22/2018 CLINICAL DATA:  63 year old female with pleural effusion EXAM: PORTABLE CHEST 1 VIEW COMPARISON:  09/21/2018 FINDINGS: Cardiomediastinal silhouette unchanged in size and contour. Endotracheal tube terminates suitably above the carina 5.7 cm. Gastric tube projects over the mediastinum and terminates out of the field of view. Background changes of emphysema Veil opacity in the right chest with partial obscuration of the visualized right hemidiaphragm. Unchanged from prior. Coarsened interstitial markings bilaterally. No pneumothorax. IMPRESSION: Unchanged appearance of the chest x-ray, with unchanged gastric tube and endotracheal tube. Similar appearance of right-sided pleural effusion with associated atelectasis/consolidation. Background of emphysema Electronically Signed   By: Corrie Mckusick D.O.   On: 09/22/2018 10:43   Portable Chest X-ray  Result Date: 09/21/2018 CLINICAL DATA:  Endotracheal tube placement EXAM: PORTABLE CHEST 1 VIEW COMPARISON:  09/21/2018 FINDINGS: Endotracheal tube is 5.5 cm above the carina. Hyperinflation of the lungs. Layering right pleural effusion with right lower lobe airspace opacity. No focal opacity on the left. Heart is borderline in size. IMPRESSION: Endotracheal tube 5.5 cm above the carina. Moderate layering right effusion with right lower lobe atelectasis or infiltrate. Hyperinflation. Electronically Signed   By: Rolm Baptise M.D.   On: 09/21/2018 22:59   Dg Chest Portable 1 View  Result Date: 09/21/2018 CLINICAL DATA:  Shortness of breath, tube placement EXAM: PORTABLE CHEST 1 VIEW COMPARISON:  Portable exam 1957 hours compared to earlier study of 12 T4 hours FINDINGS: Tip of endotracheal  tube projects 5.8 cm above carina. Nasogastric tube extends into stomach. Normal heart size, mediastinal contours, and pulmonary vascularity. Atherosclerotic calcification aorta. Emphysematous changes with decreased RIGHT pleural effusion and basilar atelectasis versus previous study. No new infiltrate or pneumothorax. Bones demineralized. IMPRESSION: Tube positions as above. COPD changes with decreased RIGHT pleural effusion and basilar atelectasis since earlier study. Electronically Signed   By: Lavonia Dana M.D.   On: 09/21/2018 20:42   Vas [place] Lower Extremity Venous (dvt)  Result Date: 09/22/2018  Lower Venous Study Indications: Swelling.  Performing Technologist: Oliver Hum RVT  Examination Guidelines: A complete evaluation includes B-mode imaging, spectral Doppler, color Doppler, and power Doppler as needed of all accessible portions of each vessel. Bilateral testing is considered an integral part of a complete examination. Limited examinations for reoccurring indications may be performed as noted.  +---------+---------------+---------+-----------+----------+-------+  RIGHT     Compressibility Phasicity Spontaneity Properties Summary  +---------+---------------+---------+-----------+----------+-------+  CFV       Full            Yes       Yes                             +---------+---------------+---------+-----------+----------+-------+  SFJ       Full                                                      +---------+---------------+---------+-----------+----------+-------+  FV Prox   Full                                                      +---------+---------------+---------+-----------+----------+-------+  FV Mid  Full                                                      +---------+---------------+---------+-----------+----------+-------+  FV Distal Full                                                      +---------+---------------+---------+-----------+----------+-------+  PFV       Full                                                       +---------+---------------+---------+-----------+----------+-------+  POP       Full            Yes       Yes                             +---------+---------------+---------+-----------+----------+-------+  PTV       Full                                                      +---------+---------------+---------+-----------+----------+-------+  PERO      Full                                                      +---------+---------------+---------+-----------+----------+-------+   +---------+---------------+---------+-----------+----------+-------+  LEFT      Compressibility Phasicity Spontaneity Properties Summary  +---------+---------------+---------+-----------+----------+-------+  CFV       Full            Yes       Yes                             +---------+---------------+---------+-----------+----------+-------+  SFJ       Full                                                      +---------+---------------+---------+-----------+----------+-------+  FV Prox   Full                                                      +---------+---------------+---------+-----------+----------+-------+  FV Mid    Full                                                      +---------+---------------+---------+-----------+----------+-------+  FV Distal Full                                                      +---------+---------------+---------+-----------+----------+-------+  PFV       Full                                                      +---------+---------------+---------+-----------+----------+-------+  POP       Full            Yes       Yes                             +---------+---------------+---------+-----------+----------+-------+  PTV       Full                                                      +---------+---------------+---------+-----------+----------+-------+  PERO      Full                                                       +---------+---------------+---------+-----------+----------+-------+     Summary: Right: There is no evidence of deep vein thrombosis in the lower extremity. No cystic structure found in the popliteal fossa. Left: There is no evidence of deep vein thrombosis in the lower extremity. No cystic structure found in the popliteal fossa.  *See table(s) above for measurements and observations. Electronically signed by Deitra Mayo MD on 09/22/2018 at 1:58:31 PM.    Final     Scheduled Meds:  heparin  5,000 Units Subcutaneous Q8H   ipratropium-albuterol  3 mL Nebulization TID   mouth rinse  15 mL Mouth Rinse BID    Continuous Infusions:  sodium chloride Stopped (09/21/18 1441)     Flora Lipps, MD  [facility] 09/23/2018

## 2018-09-24 DIAGNOSIS — E876 Hypokalemia: Secondary | ICD-10-CM

## 2018-09-24 LAB — BLOOD GAS, ARTERIAL
Acid-Base Excess: 20.9 mmol/L — ABNORMAL HIGH (ref 0.0–2.0)
Bicarbonate: 47.4 mmol/L — ABNORMAL HIGH (ref 20.0–28.0)
O2 Content: 4 L/min
O2 Saturation: 97.1 %
Patient temperature: 98.6
pCO2 arterial: 84.2 mmHg (ref 32.0–48.0)
pH, Arterial: 7.369 (ref 7.350–7.450)
pO2, Arterial: 92.7 mmHg (ref 83.0–108.0)

## 2018-09-24 LAB — COMPREHENSIVE METABOLIC PANEL
ALT: 435 U/L — ABNORMAL HIGH (ref 0–44)
AST: 193 U/L — ABNORMAL HIGH (ref 15–41)
Albumin: 2.5 g/dL — ABNORMAL LOW (ref 3.5–5.0)
Alkaline Phosphatase: 53 U/L (ref 38–126)
Anion gap: 9 (ref 5–15)
BUN: 17 mg/dL (ref 8–23)
CO2: 43 mmol/L — ABNORMAL HIGH (ref 22–32)
Calcium: 7.4 mg/dL — ABNORMAL LOW (ref 8.9–10.3)
Chloride: 81 mmol/L — ABNORMAL LOW (ref 98–111)
Creatinine, Ser: 0.54 mg/dL (ref 0.44–1.00)
GFR calc Af Amer: >60 mL/min (ref >60)
GFR calc non Af Amer: >60 mL/min (ref >60)
Glucose, Bld: 96 mg/dL (ref 70–99)
Potassium: 3.1 mmol/L — ABNORMAL LOW (ref 3.5–5.1)
Sodium: 133 mmol/L — ABNORMAL LOW (ref 135–145)
Total Bilirubin: 1.5 mg/dL — ABNORMAL HIGH (ref 0.3–1.2)
Total Protein: 4.2 g/dL — ABNORMAL LOW (ref 6.5–8.1)

## 2018-09-24 LAB — PROTIME-INR
INR: 1.2 (ref 0.8–1.2)
Prothrombin Time: 15.1 seconds (ref 11.4–15.2)

## 2018-09-24 LAB — CBC
HCT: 42.6 % (ref 36.0–46.0)
Hemoglobin: 13.2 g/dL (ref 12.0–15.0)
MCH: 27 pg (ref 26.0–34.0)
MCHC: 31 g/dL (ref 30.0–36.0)
MCV: 87.3 fL (ref 80.0–100.0)
Platelets: 173 10*3/uL (ref 150–400)
RBC: 4.88 MIL/uL (ref 3.87–5.11)
RDW: 13.6 % (ref 11.5–15.5)
WBC: 18.5 10*3/uL — ABNORMAL HIGH (ref 4.0–10.5)
nRBC: 0.2 % (ref 0.0–0.2)

## 2018-09-24 LAB — MAGNESIUM: Magnesium: 1.5 mg/dL — ABNORMAL LOW (ref 1.7–2.4)

## 2018-09-24 LAB — PHOSPHORUS: Phosphorus: 1.9 mg/dL — ABNORMAL LOW (ref 2.5–4.6)

## 2018-09-24 MED ORDER — POTASSIUM CHLORIDE CRYS ER 20 MEQ PO TBCR
40.0000 meq | EXTENDED_RELEASE_TABLET | Freq: Two times a day (BID) | ORAL | Status: DC
  Administered 2018-09-24 – 2018-09-25 (×3): 40 meq via ORAL
  Filled 2018-09-24 (×3): qty 2

## 2018-09-24 MED ORDER — POTASSIUM PHOSPHATE MONOBASIC 500 MG PO TABS
500.0000 mg | ORAL_TABLET | Freq: Three times a day (TID) | ORAL | Status: DC
  Filled 2018-09-24 (×2): qty 1

## 2018-09-24 MED ORDER — K PHOS MONO-SOD PHOS DI & MONO 155-852-130 MG PO TABS
500.0000 mg | ORAL_TABLET | Freq: Three times a day (TID) | ORAL | Status: DC
  Administered 2018-09-24 – 2018-09-25 (×4): 500 mg via ORAL
  Filled 2018-09-24 (×6): qty 2

## 2018-09-24 MED ORDER — MAGNESIUM SULFATE 2 GM/50ML IV SOLN
2.0000 g | Freq: Once | INTRAVENOUS | Status: AC
  Administered 2018-09-24: 2 g via INTRAVENOUS
  Filled 2018-09-24: qty 50

## 2018-09-24 MED ORDER — MAGNESIUM OXIDE 400 (241.3 MG) MG PO TABS
400.0000 mg | ORAL_TABLET | Freq: Two times a day (BID) | ORAL | Status: DC
  Administered 2018-09-24 – 2018-09-25 (×3): 400 mg via ORAL
  Filled 2018-09-24 (×3): qty 1

## 2018-09-24 NOTE — Progress Notes (Signed)
When this RN was off the unit, patients husband called the front desk and stated that his wife had called him and stated that she needed oxygen. Janelle, RN went into the patients room and checked her oxygen saturation and it was 77%. Janelle, RN placed patient on 2L of oxygen and patients oxygen saturation came up to 90%. Per Angus Palms, RN patient did not exhibit symptoms of respiratory distress. RT notified and made aware of this situation. Will continue to monitor.

## 2018-09-24 NOTE — Progress Notes (Addendum)
Paged Dr Vernetta Honey, [facility] about abnormal ABG results. Decrease O2 and reassess at next neb treatment for continued lethargy.

## 2018-09-24 NOTE — Progress Notes (Addendum)
PROGRESS NOTE  Evelyn Hughes TFT:[id number] DOB: 03-10-1956 DOA: 09/21/2018 PCP: Patient, No Pcp Per   LOS: 3 days   Brief narrative: 63 year old female with no documented past medical history but not under recent medical care, presented to the hospital with dyspnea, lower extremity edema, encephalopathy and hypoxic respiratory failure.  She also had acute renal failure and decompensated CHF on presentation with volume overload.   Chest x-ray showed hyperinflation and profound emphysematous changes, infiltrates with a large right pleural effusion, BNP 2000.  She received antibiotics x1, discontinued in absence of clear evidence for pneumonia.  Patient was intubated for hypoxic respiratory failure despite being on high flow nasal cannula and BiPAP.  Patient frequently, self extubated and has tolerated nasal cannula well.  Patient was then considered for transfer to medical service from the ICU.    Subjective: Today, patient is slightly confused and sluggish.  Denies overt respiratory distress, denies fever or chills.  No chest pain  Assessment/Plan:  Active Problems:   Acute respiratory failure with hypoxia and hypercapnia (HCC)   Acute respiratory failure (HCC)   Pleural effusion on right   Other secondary pulmonary hypertension (HCC)   Acute renal failure (HCC)   Stage I pressure ulcer of left buttock   Acute diastolic HF with  underlying COPD with hypercarbic respiratory failure -trops flat.  Continue oxygen, nebulizers. Received IV lasix. 2D echocardiogram reviewed with preserved LV function of 55 to 60% with estimated right ventricular systolic pressure of 46 mmHg.  Currently on 4 L of nasal cannula.  ABG was done and has hypercarbia.  Spoke with respiratory therapist about it.  Will decrease oxygen by nasal cannula if patient remains confused and lethargic will consider BiPAP.  I spoke with the patient's husband about the patient's condition.  Hypervolemic hyponatremia.  Currently  stable.  Improved.  Acute kidney injury on presentation.  This has improved with diuresis. Change to Po lasix from tomorrow.  Hypokalemia, hypomagnesemia, hypophosphatemia likely from diuresis.  Will continue to replenish oral potassium with diuretic.  Replenish magnesium and phosphorus with K-Phos and magnesium sulfate IV and p.o.  Levels in a.m.   Elevated LFTs.  Likely hepatic congestion from right-sided heart failure.  CT scan showing ascites and right-sided pleural effusion.  Trend LFTs.  Mild coagulopathy   Received 1 dose of vitamin K.  INR of 1.2 today.  Significant leukocytosis  No obvious source of infection.  CT chest without infiltrate.  Procalcitonin was negative.  Urinalysis was negative for infection.  Trending down CBC.  Will monitor  Acute metabolic encephalopathy-  Likely secondary to hypercarbic/hypoxic respiratory failure.  Slightly confused today.  Will consider BiPAP if mentation does not improve.  Will decrease oxygen through nasal cannula.  Stage I pressure ulceration of the left buttocks present on admission.  Continue pressure ulcer prevention protocol   VTE Prophylaxis:  SCD now.    Code Status: Full code.  Family Communication: I spoke with the patient's husband and updated him about the clinical condition of the patient and the treatment plan.  Disposition Plan: Home likely in 2 to 3 days.  We will await her clinical improvement. Consider Bipap if respiratory distress. Get ABG in am.  Consultants:  PCCM  Procedures:  Endotracheal intubation and mechanical ventilation on 09/21/2018  Antibiotics: Anti-infectives (From admission, onward)   Start     Dose/Rate Route Frequency Ordered Stop   09/21/18 1345  vancomycin (VANCOCIN) IVPB 1000 mg/200 mL premix     1,000 mg 200 mL/hr  over 60 Minutes Intravenous  Once 09/21/18 1333 09/21/18 1452   09/21/18 1345  ceFEPIme (MAXIPIME) 2 g in sodium chloride 0.9 % 100 mL IVPB     2 g 200 mL/hr over 30 Minutes  Intravenous  Once 09/21/18 1333 09/21/18 1440   09/21/18 1341  ceFEPIme (MAXIPIME) 2 g injection    Note to Pharmacy:  Sandra Cockayne   : cabinet override      09/21/18 1341 09/22/18 0144   09/21/18 1330  cefTRIAXone (ROCEPHIN) 1 g in sodium chloride 0.9 % 100 mL IVPB  Status:  Discontinued     1 g 200 mL/hr over 30 Minutes Intravenous  Once 09/21/18 1320 09/21/18 1333   09/21/18 1330  azithromycin (ZITHROMAX) 500 mg in sodium chloride 0.9 % 250 mL IVPB  Status:  Discontinued     500 mg 250 mL/hr over 60 Minutes Intravenous  Once 09/21/18 1320 09/21/18 1333   09/21/18 1326  azithromycin (ZITHROMAX) 500 MG injection    Note to Pharmacy:  Sandra Cockayne   : cabinet override      09/21/18 1326 09/22/18 0129       Objective: Vitals:   09/24/18 0855 09/24/18 1053  BP: (!) 99/53   Pulse: 98   Resp: 20   Temp:    SpO2: 96% 92%    Intake/Output Summary (Last 24 hours) at 09/24/2018 1248 Last data filed at 09/24/2018 0900 Gross per 24 hour  Intake 1377 ml  Output 1800 ml  Net -423 ml   Filed Weights   09/21/18 2130 09/22/18 0438 09/23/18 2008  Weight: 51.6 kg 51.7 kg 52.1 kg   Body mass index is 17.46 kg/m.   Physical Exam: GENERAL: Patient is alert awake and communicative, slightly confused and sleepy at times..  Not in obvious distress.  Appears ill, on nasal cannula. HENT: No scleral pallor or icterus. Pupils equally reactive to light. Oral mucosa is moist NECK: is supple, no palpable thyroid enlargement. CHEST: Diminished breath sounds bilaterally.  Coarse breath sounds. CVS: S1 and S2 heard, no murmur. Regular rate and rhythm. No pericardial rub. ABDOMEN: Soft, non-tender, mildly distended abdomen, bowel sounds are present. No palpable hepato-splenomegaly. EXTREMITIES: Bilateral lower extremity edema noted, more on the right has improved CNS: Cranial nerves are intact. No focal motor or sensory deficits. SKIN: warm and dry without rashes.  Data Review: I have personally  reviewed the following laboratory data and studies,  CBC: Recent Labs  Lab 09/21/18 1243  09/21/18 1851 09/21/18 1906 09/21/18 2150 09/22/18 0600 09/24/18 0412  WBC 20.1*  --   --   --   --  24.1* 18.5*  NEUTROABS 17.1*  --   --   --   --   --   --   HGB 15.9*   < > 18.0* 17.7* 17.0* 15.0 13.2  HCT 53.0*   < > 53.0* 52.0* 50.0* 45.7 42.6  MCV 90.0  --   --   --   --  83.7 87.3  PLT 343  --   --   --   --  253 173   < > = values in this interval not displayed.   Basic Metabolic Panel: Recent Labs  Lab 09/21/18 1243  09/21/18 2150 09/21/18 2310 09/22/18 0600 09/23/18 0445 09/24/18 0412  NA 126*   < > 126* 131* 131* 135 133*  K 5.5*   < > 4.6 5.0 4.8 3.1* 3.1*  CL 87*  --   --  89* 88* 88* 81*  CO2 23  --   --  28 22 35* 43*  GLUCOSE 109*  --   --  86 74 73 96  BUN 55*  --   --  54* 54* 27* 17  CREATININE 1.92*  --   --  1.54* 1.36* 0.83 0.54  CALCIUM 8.1*  --   --  7.7* 7.8* 7.4* 7.4*  MG  --   --   --  1.7  --   --  1.5*  PHOS  --   --   --  5.9*  --   --  1.9*   < > = values in this interval not displayed.   Liver Function Tests: Recent Labs  Lab 09/21/18 1243 09/22/18 0600 09/23/18 0445 09/24/18 0412  AST 563* 676* 255* 193*  ALT 479* 683* 480* 435*  ALKPHOS 84 68 56 53  BILITOT 1.3* 1.8* 1.7* 1.5*  PROT 6.1* 4.4* 4.0* 4.2*  ALBUMIN 3.6 2.8* 2.4* 2.5*   No results for input(s): LIPASE, AMYLASE in the last 168 hours. No results for input(s): AMMONIA in the last 168 hours. Cardiac Enzymes: Recent Labs  Lab 09/21/18 1244 09/21/18 2310 09/22/18 0600  TROPONINI 0.06* 0.09* 0.08*   BNP (last 3 results) Recent Labs    09/21/18 1244 09/22/18 0600 09/23/18 1433  BNP 2,229.0* 397.2* 339.1*    ProBNP (last 3 results) No results for input(s): PROBNP in the last 8760 hours.  CBG: Recent Labs  Lab 09/21/18 2323 09/22/18 0350 09/22/18 0738 09/22/18 1128 09/22/18 1535  GLUCAP 82 81 74 81 86   Recent Results (from the past 240 hour(s))  Culture,  blood (routine x 2)     Status: None (Preliminary result)   Collection Time: 09/21/18 12:44 PM  Result Value Ref Range Status   Specimen Description   Final    BLOOD LEFT HAND Performed at [facility], [address] [place], [place] [address]    Special Requests   Final    BOTTLES DRAWN AEROBIC AND ANAEROBIC Blood Culture adequate volume Performed at [facility], [address] [place], [place] [address]    Culture   Final    NO GROWTH 3 DAYS Performed at [facility], [place] [address], [place], [place] [address]    Report Status PENDING  Incomplete  Culture, blood (routine x 2)     Status: None (Preliminary result)   Collection Time: 09/21/18  1:01 PM  Result Value Ref Range Status   Specimen Description   Final    RIGHT ANTECUBITAL Performed at [facility], [address] [place], [place] [address]    Special Requests   Final    BOTTLES DRAWN AEROBIC AND ANAEROBIC Blood Culture adequate volume Performed at [facility], [address] [place], [place] [address]    Culture   Final    NO GROWTH 3 DAYS Performed at [facility], [place] [address], [place],  [address]    Report Status PENDING  Incomplete  SARS Coronavirus 2 (Hosp order,Performed in [facility] lab via [employer] ID)     Status: None   Collection Time: 09/21/18  1:01 PM  Result Value Ref Range Status   SARS Coronavirus 2 ([employer] ID Now) NEGATIVE NEGATIVE Final    Comment: (NOTE) Interpretive Result Comment(s): COVID 19 Positive SARS CoV 2 target nucleic acids are DETECTED. The SARS CoV 2 RNA is generally detectable in upper and lower respiratory  specimens during the acute phase of infection.  Positive results are indicative of active infection with SARS CoV 2.  Clinical correlation with patient history and other diagnostic information is necessary to determine patient infection status.  Positive results do not rule out bacterial infection or  coinfection with other viruses. The expected result is Negative. COVID 19 Negative SARS CoV 2 target nucleic acids are NOT DETECTED. The SARS CoV 2 RNA is generally detectable in upper and lower respiratory specimens during the acute phase of infection.  Negative results do not preclude SARS CoV 2 infection, do not rule out coinfections with other pathogens, and should not be used as the sole basis for treatment or other patient management decisions.  Negative results must be combined with clinical  observations, patient history, and epidemiological information. The expected result is Negative. Invalid Presence or absence of SARS CoV 2 nucleic acids cannot be determined. Repeat testing was performed on the submitted specimen and repeated Invalid results were obtained.  If clinically indicated, additional testing on a new specimen with an alternate test methodology [phone]) is advised.  The SARS CoV 2 RNA is generally detectable in upper and lower respiratory specimens during the acute phase of infection. The expected result is Negative. Fact Sheet for Patients:  GolfingFamily.no Fact Sheet for Healthcare Providers: https://www.hernandez-brewer.com/ This test is not yet approved or cleared by the [place] FDA and has been authorized for detection and/or diagnosis of SARS CoV 2 by FDA under an Emergency Use Authorization (EUA).  This EUA will remain in effect (meaning this test can be used) for the duration of the COVID19 d eclaration under Section 564(b)(1) of the Act, 21 U.S.C. section [phone] 3(b)(1), unless the authorization is terminated or revoked sooner. Performed at [facility], [address] [place], [place] [address]   MRSA PCR Screening     Status: None   Collection Time: 09/21/18  9:43 PM  Result Value Ref Range Status   MRSA by PCR NEGATIVE NEGATIVE Final    Comment:        The GeneXpert MRSA Assay (FDA approved  for NASAL specimens only), is one component of a comprehensive MRSA colonization surveillance program. It is not intended to diagnose MRSA infection nor to guide or monitor treatment for MRSA infections. Performed at [facility], [place] [address], [place], [place] [address]      Studies: Dg Chest Port 1 View  Result Date: 09/23/2018 CLINICAL DATA:  Leg swelling, shortness of breath EXAM: PORTABLE CHEST 1 VIEW COMPARISON:  09/21/2018 FINDINGS: Interval removal of previously seen endotracheal and esophagogastric tubes. Slightly increased atelectasis or consolidation at the right lung base with small right greater than left pleural effusions. Underlying hyperinflation and emphysema. Cardiomegaly. IMPRESSION: Interval removal of previously seen endotracheal and esophagogastric tubes. Slightly increased atelectasis or consolidation at the right lung base with small right greater than left pleural effusions. Underlying hyperinflation and emphysema. Cardiomegaly. Electronically Signed   By: Eddie Candle M.D.   On: 09/23/2018 08:17    Scheduled Meds: . budesonide  0.25 mg Nebulization BID  . feeding supplement (ENSURE ENLIVE)  237 mL Oral BID BM  . heparin  5,000 Units Subcutaneous Q8H  . ipratropium-albuterol  3 mL Nebulization TID  . mouth rinse  15 mL Mouth Rinse BID  . multivitamin with minerals  1 tablet Oral Daily    Continuous Infusions: . sodium chloride Stopped (09/21/18 1441)     Flora Lipps, MD  [facility] 09/24/2018

## 2018-09-24 NOTE — Progress Notes (Signed)
SATURATION QUALIFICATIONS: (This note is used to comply with regulatory documentation for home oxygen)  Patient Saturations on Room Air at Rest = 77%  Patient Saturations on Room Air while Ambulating = 77%  Patient Saturations on 4 Liters of oxygen while Ambulating = 91%  Please briefly explain why patient needs home oxygen: Patients oxygen saturation on room air is 77% at rest.

## 2018-09-24 NOTE — TOC Initial Note (Signed)
Transition of Care [facility]) - Initial/Assessment Note    Patient Details  Name: Evelyn Hughes MRN: [id number] Date of Birth: 11/07/1955  Transition of Care [facility]) CM/SW Contact:    Carles Collet, RN Phone Number: 09/24/2018, 2:16 PM  Clinical Narrative:                  Anticipate patient will need [place] PT ad RN with new oxygen start. Referral accepted by Tommi Rumps with [facility] for [facility] services. Qualifying note for oxygen placed 5/7.  NEEDS: HH Order Oxygen order and referral to adapt.    Expected Discharge Plan: [place] Barriers to Discharge: Continued Medical Work up   Patient Goals and CMS Choice Patient states their goals for this hospitalization and ongoing recovery are:: to return home   Choice offered to / list presented to : NA  Expected Discharge Plan and Services Expected Discharge Plan: [place] Acute Care Choice: Home Health, Durable Medical Equipment                             HH Arranged: RN, PT [facility] Agency: [place] Date [facility] Agency Contacted: 09/24/18 Time HH Agency Contacted: 78 Representative spoke with at [place]: cory  Prior Living Arrangements/Services                       Activities of Daily Living      Permission Sought/Granted                  Emotional Assessment              Admission diagnosis:  Hyperkalemia [E87.5] Hyponatremia [E87.1] Acute respiratory failure with hypoxia and hypercapnia (HCC) [J96.01, J96.02] Acute renal failure, unspecified acute renal failure type ([place]) [N17.9] Patient Active Problem List   Diagnosis Date Noted  . Stage I pressure ulcer of left buttock 09/23/2018  . Pleural effusion on right   . Other secondary pulmonary hypertension ([place])   . Acute renal failure ([place])   . Acute respiratory failure with hypoxia and hypercapnia ([place]) 09/21/2018  . Acute respiratory failure ([place]) 09/21/2018   PCP:  Patient, No Pcp Per Pharmacy:    [facility], [place] [address] Phone: [phone] Fax: [phone]  [facility] [phone] [place], [place] - [place] [address] [place] [address] Phone: [phone] Fax: [phone]     Social Determinants of Health ([place]) Interventions    Readmission Risk Interventions No flowsheet data found.

## 2018-09-24 NOTE — Progress Notes (Signed)
Physical Therapy Treatment Patient Details Name: Evelyn Hughes MRN: [id number] DOB: 12/23/1955 Today's Date: 09/24/2018    History of Present Illness 21 yoF from home with no known PMH other than tobacco abuse presenting with progressive hx of fatigue, SOB, and leg swelling and new confusion.  Hypoxic failing HFNC and BIPAP requiring intubation.  Additionally noted to have AKI, Transaminitis, and evidence of volume overload concerning for acute decompensated heart failure.     PT Comments    Patient progressing mobility this visit, ambulating 200' with poor balance, use of RW, on 4L with SpO2 >91%. Patient cognition still of concern. Will require initial  24/7 supervision and hands on guarding from spouse upon return home.    Follow Up Recommendations  Home health PT;Supervision/Assistance - 24 hour     Equipment Recommendations  (TBD pending progress)    Recommendations for Other Services       Precautions / Restrictions Precautions Precautions: Fall Restrictions Weight Bearing Restrictions: No    Mobility  Bed Mobility Overal bed mobility: Modified Independent                Transfers Overall transfer level: Modified independent                  Ambulation/Gait Ambulation/Gait assistance: Min guard Gait Distance (Feet): 200 Feet Assistive device: None;Rolling walker (2 wheeled) Gait Pattern/deviations: Step-through pattern Gait velocity: decreased   General Gait Details: ambulating further distances, needs RW at this time for safety. SpO2 >91% during gait on 4L    Stairs             Wheelchair Mobility    Modified Rankin (Stroke Patients Only)       Balance Overall balance assessment: Needs assistance   Sitting balance-Leahy Scale: Good       Standing balance-Leahy Scale: Fair                              Cognition Arousal/Alertness: Awake/alert Behavior During Therapy: Flat affect Overall Cognitive Status:  Impaired/Different from baseline                                 General Comments: pt reports her memory is not at baseline, slow processing observed, did not know it was may. "i am way more confused than normal toady".       Exercises      General Comments        Pertinent Vitals/Pain      Home Living                      Prior Function            PT Goals (current goals can now be found in the care plan section) Acute Rehab PT Goals Patient Stated Goal: to go home PT Goal Formulation: With patient Time For Goal Achievement: 10/07/18 Potential to Achieve Goals: Good Progress towards PT goals: Progressing toward goals    Frequency    Min 3X/week      PT Plan      Co-evaluation              AM-PAC PT "6 Clicks" Mobility   Outcome Measure  Help needed turning from your back to your side while in a flat bed without using bedrails?: None Help needed moving from lying on your back to  sitting on the side of a flat bed without using bedrails?: None Help needed moving to and from a bed to a chair (including a wheelchair)?: A Little Help needed standing up from a chair using your arms (e.g., wheelchair or bedside chair)?: A Little Help needed to walk in hospital room?: A Little Help needed climbing 3-5 steps with a railing? : A Lot 6 Click Score: 19    End of Session Equipment Utilized During Treatment: Gait belt Activity Tolerance: Patient tolerated treatment well Patient left: in bed;with call bell/phone within reach;with bed alarm set Nurse Communication: Mobility status PT Visit Diagnosis: Unsteadiness on feet (R26.81)     Time: 4196-2229 PT Time Calculation (min) (ACUTE ONLY): 23 min  Charges:  $Gait Training: 8-22 mins $Therapeutic Activity: 8-22 mins                     Reinaldo Berber, PT, DPT Acute Rehabilitation Services Pager: [phone] Office: [place] 09/24/2018, 11:08 AM

## 2018-09-24 NOTE — Progress Notes (Signed)
Physical Therapy Treatment Patient Details Name: Evelyn Hughes MRN: [id number] DOB: 04-Oct-1955 Today's Date: 09/24/2018    History of Present Illness 46 yoF from home with no known PMH other than tobacco abuse presenting with progressive hx of fatigue, SOB, and leg swelling and new confusion.  Hypoxic failing HFNC and BIPAP requiring intubation.  Additionally noted to have AKI, Transaminitis, and evidence of volume overload concerning for acute decompensated heart failure.     PT Comments    Pt ambulating with improved balance, on 2L now satting above 91% this visit. Still using RW and cognition not improving, responding inappropriately with poor awareness.     Follow Up Recommendations  Home health PT;Supervision/Assistance - 24 hour     Equipment Recommendations  (TBD pending progress)    Recommendations for Other Services       Precautions / Restrictions Precautions Precautions: Fall Restrictions Weight Bearing Restrictions: No    Mobility  Bed Mobility Overal bed mobility: Modified Independent                Transfers Overall transfer level: Modified independent                  Ambulation/Gait Ambulation/Gait assistance: Min guard Gait Distance (Feet): 200 Feet Assistive device: None;Rolling walker (2 wheeled) Gait Pattern/deviations: Step-through pattern Gait velocity: decreased   General Gait Details: ambulating further distances, needs RW at this time for safety. SpO2 >91% during gait on 4L    Stairs             Wheelchair Mobility    Modified Rankin (Stroke Patients Only)       Balance Overall balance assessment: Needs assistance   Sitting balance-Leahy Scale: Good       Standing balance-Leahy Scale: Fair                              Cognition Arousal/Alertness: Awake/alert Behavior During Therapy: Flat affect Overall Cognitive Status: Impaired/Different from baseline                                 General Comments: pt reports her memory is not at baseline, slow processing observed, did not know it was may. "i am way more confused than normal toady".       Exercises      General Comments        Pertinent Vitals/Pain      Home Living                      Prior Function            PT Goals (current goals can now be found in the care plan section) Acute Rehab PT Goals Patient Stated Goal: to go home PT Goal Formulation: With patient Time For Goal Achievement: 10/07/18 Potential to Achieve Goals: Good    Frequency    Min 3X/week      PT Plan      Co-evaluation              AM-PAC PT "6 Clicks" Mobility   Outcome Measure  Help needed turning from your back to your side while in a flat bed without using bedrails?: None Help needed moving from lying on your back to sitting on the side of a flat bed without using bedrails?: None Help needed moving to and from a bed  to a chair (including a wheelchair)?: A Little Help needed standing up from a chair using your arms (e.g., wheelchair or bedside chair)?: A Little Help needed to walk in hospital room?: A Little Help needed climbing 3-5 steps with a railing? : A Lot 6 Click Score: 19    End of Session Equipment Utilized During Treatment: Gait belt Activity Tolerance: Patient tolerated treatment well Patient left: in bed;with call bell/phone within reach;with bed alarm set Nurse Communication: Mobility status PT Visit Diagnosis: Unsteadiness on feet (R26.81)     Time: 9355-2174 PT Time Calculation (min) (ACUTE ONLY): 25 min  Charges:  $Gait Training: 8-22 mins                     Reinaldo Berber, PT, DPT Acute Rehabilitation Services Pager: [phone] Office: [place] 09/24/2018, 4:55 PM

## 2018-09-25 ENCOUNTER — Encounter (HOSPITAL_COMMUNITY): Payer: Self-pay | Admitting: Internal Medicine

## 2018-09-25 DIAGNOSIS — I5031 Acute diastolic (congestive) heart failure: Secondary | ICD-10-CM

## 2018-09-25 DIAGNOSIS — J441 Chronic obstructive pulmonary disease with (acute) exacerbation: Secondary | ICD-10-CM

## 2018-09-25 LAB — COMPREHENSIVE METABOLIC PANEL
ALT: 386 U/L — ABNORMAL HIGH (ref 0–44)
AST: 153 U/L — ABNORMAL HIGH (ref 15–41)
Albumin: 2.6 g/dL — ABNORMAL LOW (ref 3.5–5.0)
Alkaline Phosphatase: 54 U/L (ref 38–126)
Anion gap: 11 (ref 5–15)
BUN: 11 mg/dL (ref 8–23)
CO2: 43 mmol/L — ABNORMAL HIGH (ref 22–32)
Calcium: 7.8 mg/dL — ABNORMAL LOW (ref 8.9–10.3)
Chloride: 80 mmol/L — ABNORMAL LOW (ref 98–111)
Creatinine, Ser: 0.54 mg/dL (ref 0.44–1.00)
GFR calc Af Amer: >60 mL/min (ref >60)
GFR calc non Af Amer: >60 mL/min (ref >60)
Glucose, Bld: 101 mg/dL — ABNORMAL HIGH (ref 70–99)
Potassium: 4.3 mmol/L (ref 3.5–5.1)
Sodium: 134 mmol/L — ABNORMAL LOW (ref 135–145)
Total Bilirubin: 1.1 mg/dL (ref 0.3–1.2)
Total Protein: 4.4 g/dL — ABNORMAL LOW (ref 6.5–8.1)

## 2018-09-25 LAB — CBC
HCT: 42.6 % (ref 36.0–46.0)
Hemoglobin: 12.9 g/dL (ref 12.0–15.0)
MCH: 27.2 pg (ref 26.0–34.0)
MCHC: 30.3 g/dL (ref 30.0–36.0)
MCV: 89.7 fL (ref 80.0–100.0)
Platelets: 151 10*3/uL (ref 150–400)
RBC: 4.75 MIL/uL (ref 3.87–5.11)
RDW: 13.6 % (ref 11.5–15.5)
WBC: 16.6 10*3/uL — ABNORMAL HIGH (ref 4.0–10.5)
nRBC: 0 % (ref 0.0–0.2)

## 2018-09-25 LAB — BLOOD GAS, ARTERIAL
Acid-Base Excess: 20 mmol/L — ABNORMAL HIGH (ref 0.0–2.0)
Bicarbonate: 46.2 mmol/L — ABNORMAL HIGH (ref 20.0–28.0)
Drawn by: 31101
O2 Content: 2 L/min
O2 Saturation: 95.9 %
Patient temperature: 98.6
pCO2 arterial: 76.7 mmHg (ref 32.0–48.0)
pH, Arterial: 7.397 (ref 7.350–7.450)
pO2, Arterial: 76.3 mmHg — ABNORMAL LOW (ref 83.0–108.0)

## 2018-09-25 LAB — PHOSPHORUS: Phosphorus: 2 mg/dL — ABNORMAL LOW (ref 2.5–4.6)

## 2018-09-25 LAB — MAGNESIUM: Magnesium: 2.2 mg/dL (ref 1.7–2.4)

## 2018-09-25 MED ORDER — ALBUTEROL SULFATE HFA 108 (90 BASE) MCG/ACT IN AERS: 2.0000 | INHALATION_SPRAY | Freq: Four times a day (QID) | RESPIRATORY_TRACT | 2 refills | Status: DC | PRN

## 2018-09-25 MED ORDER — AZITHROMYCIN 250 MG PO TABS: ORAL_TABLET | ORAL | 0 refills | Status: DC

## 2018-09-25 MED ORDER — NICOTINE 14 MG/24HR TD PT24: 14.0000 mg | MEDICATED_PATCH | TRANSDERMAL | 0 refills | Status: DC

## 2018-09-25 MED ORDER — TIOTROPIUM BROMIDE MONOHYDRATE 18 MCG IN CAPS: 18.0000 ug | ORAL_CAPSULE | Freq: Every day | RESPIRATORY_TRACT | 2 refills | Status: DC

## 2018-09-25 MED ORDER — FLUTICASONE-SALMETEROL 100-50 MCG/DOSE IN AEPB: 1.0000 | INHALATION_SPRAY | Freq: Two times a day (BID) | RESPIRATORY_TRACT | 2 refills | Status: DC

## 2018-09-25 MED ORDER — FUROSEMIDE 40 MG PO TABS
40.0000 mg | ORAL_TABLET | Freq: Every day | ORAL | Status: DC
  Administered 2018-09-25: 40 mg via ORAL
  Filled 2018-09-25: qty 1

## 2018-09-25 MED ORDER — POTASSIUM CHLORIDE CRYS ER 20 MEQ PO TBCR: 20.0000 meq | EXTENDED_RELEASE_TABLET | Freq: Every day | ORAL | 0 refills | Status: DC

## 2018-09-25 MED ORDER — FUROSEMIDE 20 MG PO TABS: 20.0000 mg | ORAL_TABLET | ORAL | 0 refills | Status: DC

## 2018-09-25 MED ORDER — MAGNESIUM OXIDE 400 (241.3 MG) MG PO TABS: 400.0000 mg | ORAL_TABLET | Freq: Two times a day (BID) | ORAL | 0 refills | Status: AC

## 2018-09-25 NOTE — Progress Notes (Signed)
Physical Therapy Treatment Patient Details Name: Evelyn Hughes MRN: [id number] DOB: 02/20/1956 Today's Date: 09/25/2018    History of Present Illness Pt is a 63 y/o F from home with no known PMH other than tobacco abuse presenting with progressive hx of fatigue, SOB, and leg swelling and new confusion.  Hypoxic failing HFNC and BIPAP requiring intubation.  Additionally noted to have AKI, Transaminitis, and evidence of volume overload concerning for acute decompensated heart failure.     PT Comments    Pt making steady progress with functional mobility; however, her cognition remains very impaired (see below for details). Pt would continue to benefit from skilled physical therapy services at this time while admitted and after d/c to address the below listed limitations in order to improve overall safety and independence with functional mobility.    Follow Up Recommendations  Home health PT;Supervision/Assistance - 24 hour     Equipment Recommendations  None recommended by PT    Recommendations for Other Services       Precautions / Restrictions Precautions Precautions: Fall Restrictions Weight Bearing Restrictions: No    Mobility  Bed Mobility Overal bed mobility: Modified Independent                Transfers Overall transfer level: Modified independent                  Ambulation/Gait Ambulation/Gait assistance: Min guard Gait Distance (Feet): 150 Feet Assistive device: Rolling walker (2 wheeled) Gait Pattern/deviations: Step-through pattern Gait velocity: decreased   General Gait Details: pt steady with RW ambulating within her room to and from bathroom, as well as into hallway; pt on RA during ambulation with SPO2 decreasing to 86% (recovering to >48% with reapplication of supplemental O2 and pursed-lip breathing)   Stairs             Wheelchair Mobility    Modified Rankin (Stroke Patients Only)       Balance Overall balance assessment:  Needs assistance Sitting-balance support: Feet supported Sitting balance-Leahy Scale: Good       Standing balance-Leahy Scale: Fair                              Cognition Arousal/Alertness: Awake/alert Behavior During Therapy: Flat affect Overall Cognitive Status: Impaired/Different from baseline Area of Impairment: Memory;Following commands;Safety/judgement;Problem solving                     Memory: Decreased short-term memory Following Commands: Follows one step commands with increased time Safety/Judgement: Decreased awareness of deficits;Decreased awareness of safety   Problem Solving: Slow processing;Difficulty sequencing;Requires verbal cues        Exercises      General Comments        Pertinent Vitals/Pain Pain Assessment: No/denies pain    Home Living                      Prior Function            PT Goals (current goals can now be found in the care plan section) Acute Rehab PT Goals PT Goal Formulation: With patient Time For Goal Achievement: 10/07/18 Potential to Achieve Goals: Good Progress towards PT goals: Progressing toward goals    Frequency    Min 3X/week      PT Plan Current plan remains appropriate    Co-evaluation  AM-PAC PT "6 Clicks" Mobility   Outcome Measure  Help needed turning from your back to your side while in a flat bed without using bedrails?: None Help needed moving from lying on your back to sitting on the side of a flat bed without using bedrails?: None Help needed moving to and from a bed to a chair (including a wheelchair)?: None Help needed standing up from a chair using your arms (e.g., wheelchair or bedside chair)?: A Little Help needed to walk in hospital room?: A Little Help needed climbing 3-5 steps with a railing? : A Lot 6 Click Score: 20    End of Session   Activity Tolerance: Patient tolerated treatment well Patient left: in bed;with call bell/phone  within reach;with bed alarm set Nurse Communication: Mobility status PT Visit Diagnosis: Unsteadiness on feet (R26.81)     Time: 9528-4132 PT Time Calculation (min) (ACUTE ONLY): 17 min  Charges:  $Gait Training: 8-22 mins                     Sherie Don, PT, DPT  Acute Rehabilitation Services Pager [phone] Office [place] 09/25/2018, 1:25 PM

## 2018-09-25 NOTE — Progress Notes (Signed)
DISCHARGE NOTE HOME Evelyn Hughes to be discharged Home per MD order. Discussed prescriptions and follow up appointments with the patient. Prescriptions given to patient; medication list explained in detail. Patient verbalized understanding.   IV catheter discontinued intact. Site without signs and symptoms of complications. Dressing and pressure applied. Pt denies pain at the site currently. No complaints noted.  Patient free of lines, drains, and wounds.   An After Visit Summary (AVS) was printed and given to the patient. Patient escorted via wheelchair, and discharged home via private auto.  Aneta Mins BSN, RN3

## 2018-09-25 NOTE — Discharge Summary (Signed)
Physician Discharge Summary  Evelyn Hughes KAJ:[id number] DOB: 06-Feb-1956 DOA: 09/21/2018  PCP: Patient, No Pcp Per  Admit date: 09/21/2018 Discharge date: 09/25/2018  Admitted From: Home  Discharge disposition:  Home with home health   Recommendations for Outpatient Follow-Up:   Follow up with your primary care provider in one week.    Discharge Diagnosis:   Active Problems:   Acute respiratory failure with hypoxia and hypercapnia (HCC)   Acute respiratory failure (HCC)   Pleural effusion on right   Other secondary pulmonary hypertension (HCC)   Acute renal failure (HCC)   Stage I pressure ulcer of left buttock    Discharge Condition: Improved.  Diet recommendation: Low sodium, heart healthy.   Wound care: None.  Code status: Full.   History of Present Illness:   63 year old female with no documented past medical history but not under recent medical care, presented to the hospital with dyspnea, lower extremity edema, encephalopathy and hypoxic respiratory failure.  She also had acute renal failure and decompensated CHF on presentation with volume overload.  Chest x-ray showed hyperinflation and profound emphysematous changes, infiltrates with a large right pleural effusion, BNP 2000. She received antibiotics x1, discontinued in absence of clear evidence for pneumonia.  Patient was intubated for hypoxic respiratory failure despite being on high flow nasal cannula and BiPAP.  Patient frequently, self extubated and has tolerated nasal cannula well.  Patient was then considered for transfer to medical service from the ICU.   Hospital Course:  Patient was admitted to the hospital and following conditions were addressed during hospitalization,  Acute diastolic HF with  underlying COPD with hypercarbic respiratory failure -trops flat.  Continued on oxygen, nebulizers. Received IV lasix. 2D echocardiogram reviewed with preserved LV function of 55 to 60% with estimated right  ventricular systolic pressure of 46 mmHg.    Patient likely has cor pulmonale from COPD.  No baseline ABG levels were available but ABG was done and showed hypercarbia.    Patient was put on nasal cannula 2 L with adequate mentation.  Her repeat ABG showed elevated CO2.  Patient likely has a chronic CO2 retention from COPD.  Patient was prescribed oxygen on discharge at 2 L/min by nasal cannula continuously.  Patient was given inhalers including Advair, Spiriva albuterol and was strongly counseled regarding cessation of smoking.  She was advised to follow-up with her primary care physician after discharge.  Was advised fluid restriction, daily weights and low-salt diet.   Hypervolemic hyponatremia.    Improved with diuretics.  Acute kidney injury on presentation.    Resolved with diuretics.  Hypokalemia, hypomagnesemia, hypophosphatemia likely from diuresis.  Improved after replacement.  Elevated LFTs.   On presentation. Likely hepatic congestion from right-sided heart failure.  CT scan showing ascites and right-sided pleural effusion.    Patient will need to follow-up CMP as outpatient.  Mild coagulopathy   Received 1 dose of vitamin K.  INR improved on subsequent repeat.  Significant leukocytosis  No obvious source of infection.  CT chest without infiltrate.  Procalcitonin was negative.  Urinalysis was negative for infection.    WBC trended down during hospitalization.  Will need CBC follow-up after discharge.  Acutemetabolicencephalopathy-  Likely secondary to hypercarbic/hypoxic respiratory failure.  Present on admission.  Improved at the time of discharge.   Stage I pressure ulceration of the left buttocks present on admission.    Was advised to continue ambulating at home and change positions.  Disposition:  This time, patient is stable  for disposition home.  Patient will be discharged home on home oxygen and home health.   Medical Consultants:    None.   Subjective:    Today, patient feels better with breathing.  Has mild cough.  No fever chest pain or shortness of breath.  On Nasal cannula oxygen.  No confusion.  Discharge Exam:   Vitals:   09/25/18 0903 09/25/18 0905  BP:    Pulse:    Resp:    Temp:    SpO2: 90% 97%   Vitals:   09/25/18 0442 09/25/18 0901 09/25/18 0903 09/25/18 0905  BP: (!) 93/55 (!) 96/59    Pulse: 96 94    Resp: 17 18    Temp: 98.1 F (36.7 C)     TempSrc: Oral     SpO2: 93% 94% 90% 97%  Weight:      Height:        General exam: Appears calm and comfortable ,Not in distress.  On nasal cannula HEENT:PERRL,Oral mucosa moist Respiratory system: Diminished breath sounds bilaterally.  No obvious crackles or wheezes noted.  Cardiovascular system: S1 & S2 heard, RRR.  Gastrointestinal system: Abdomen is nondistended, soft and nontender. No organomegaly or masses felt. Normal bowel sounds heard. Central nervous system: Alert and oriented. No focal neurological deficits. Extremities: No edema, no clubbing ,no cyanosis, distal peripheral pulses palpable. Skin: No rashes, lesions or ulcers,no icterus ,no pallor MSK: Normal muscle bulk,tone ,power    Procedures:    None  The results of significant diagnostics from this hospitalization (including imaging, microbiology, ancillary and laboratory) are listed below for reference.     Diagnostic Studies:   Ct Abdomen Pelvis Wo Contrast  Result Date: 09/21/2018 CLINICAL DATA:  Shortness of breath and bilateral leg swelling. EXAM: CT CHEST, ABDOMEN AND PELVIS WITHOUT CONTRAST TECHNIQUE: Multidetector CT imaging of the chest, abdomen and pelvis was performed following the standard protocol without IV contrast. COMPARISON:  Chest x-ray from same day. FINDINGS: CT CHEST FINDINGS Cardiovascular: Mild right heart enlargement. No pericardial effusion. No thoracic aortic aneurysm. Coronary, aortic arch, and branch vessel atherosclerotic vascular disease. Dilated main pulmonary artery  measuring up to 3.5 cm. Mediastinum/Nodes: No enlarged mediastinal, hilar, or axillary lymph nodes. Thyroid gland, trachea, and esophagus demonstrate no significant findings. Lungs/Pleura: Upper lobe predominant centrilobular and paraseptal emphysema. Moderate right and small left pleural effusions. Linear scarring/atelectasis in the right middle lobe. Right greater than left lower lobe atelectasis. No consolidation or pneumothorax. No suspicious pulmonary nodule. Musculoskeletal: No chest wall mass or suspicious bone lesions identified. CT ABDOMEN PELVIS FINDINGS Hepatobiliary: Scattered hepatic simple cysts measuring up to 5.0 cm. The gallbladder is contracted. No gallstones, gallbladder wall thickening, or biliary dilatation. Pancreas: Unremarkable. No pancreatic ductal dilatation or surrounding inflammatory changes. Spleen: Normal in size without focal abnormality. Adrenals/Urinary Tract: Adrenal glands are unremarkable. Kidneys are normal, without renal calculi, focal lesion, or hydronephrosis. Bladder is unremarkable. Stomach/Bowel: Stomach is within normal limits. Appendix appears normal. No evidence of bowel wall thickening, distention, or inflammatory changes. Vascular/Lymphatic: Aortic atherosclerosis. No enlarged abdominal or pelvic lymph nodes. Reproductive: Uterus and bilateral adnexa are unremarkable. Other: Small ascites. No pneumoperitoneum. No abdominal wall hernia. Musculoskeletal: Mild anasarca. No acute or significant osseous findings.   IMPRESSION: Chest: 1. Moderate right and small left pleural effusions.  No pneumonia. 2. Mild right heart enlargement with dilated main pulmonary artery, suggestive of pulmonary arterial hypertension. 3.  Emphysema (ICD10-J43.9). 4.  Aortic atherosclerosis (ICD10-I70.0). Abdomen and pelvis: 1.  No acute intra-abdominal process.  2. Small ascites and mild anasarca. Electronically Signed   By: Titus Dubin M.D.   On: 09/21/2018 15:38   Ct Head Wo  Contrast  Result Date: 09/21/2018 CLINICAL DATA:  Altered level of consciousness. EXAM: CT HEAD WITHOUT CONTRAST TECHNIQUE: Contiguous axial images were obtained from the base of the skull through the vertex without intravenous contrast. COMPARISON:  None. FINDINGS: Brain: There is no evidence of acute infarction or hemorrhage or mass lesion. There is slight frontal and parietal lobe atrophy. No ventricular dilatation. Vascular: No hyperdense vessel or unexpected calcification. Skull: Normal. Negative for fracture or focal lesion. Sinuses/Orbits: Normal. Other: None   IMPRESSION: Minimal frontal and parietal lobe atrophy.  Otherwise negative exam. Electronically Signed   By: Lorriane Shire M.D.   On: 09/21/2018 15:24   Ct Chest Wo Contrast  Result Date: 09/21/2018 CLINICAL DATA:  Shortness of breath and bilateral leg swelling. EXAM: CT CHEST, ABDOMEN AND PELVIS WITHOUT CONTRAST TECHNIQUE: Multidetector CT imaging of the chest, abdomen and pelvis was performed following the standard protocol without IV contrast. COMPARISON:  Chest x-ray from same day. FINDINGS: CT CHEST FINDINGS Cardiovascular: Mild right heart enlargement. No pericardial effusion. No thoracic aortic aneurysm. Coronary, aortic arch, and branch vessel atherosclerotic vascular disease. Dilated main pulmonary artery measuring up to 3.5 cm. Mediastinum/Nodes: No enlarged mediastinal, hilar, or axillary lymph nodes. Thyroid gland, trachea, and esophagus demonstrate no significant findings. Lungs/Pleura: Upper lobe predominant centrilobular and paraseptal emphysema. Moderate right and small left pleural effusions. Linear scarring/atelectasis in the right middle lobe. Right greater than left lower lobe atelectasis. No consolidation or pneumothorax. No suspicious pulmonary nodule. Musculoskeletal: No chest wall mass or suspicious bone lesions identified. CT ABDOMEN PELVIS FINDINGS Hepatobiliary: Scattered hepatic simple cysts measuring up to 5.0 cm.  The gallbladder is contracted. No gallstones, gallbladder wall thickening, or biliary dilatation. Pancreas: Unremarkable. No pancreatic ductal dilatation or surrounding inflammatory changes. Spleen: Normal in size without focal abnormality. Adrenals/Urinary Tract: Adrenal glands are unremarkable. Kidneys are normal, without renal calculi, focal lesion, or hydronephrosis. Bladder is unremarkable. Stomach/Bowel: Stomach is within normal limits. Appendix appears normal. No evidence of bowel wall thickening, distention, or inflammatory changes. Vascular/Lymphatic: Aortic atherosclerosis. No enlarged abdominal or pelvic lymph nodes. Reproductive: Uterus and bilateral adnexa are unremarkable. Other: Small ascites. No pneumoperitoneum. No abdominal wall hernia. Musculoskeletal: Mild anasarca. No acute or significant osseous findings.   IMPRESSION: Chest: 1. Moderate right and small left pleural effusions.  No pneumonia. 2. Mild right heart enlargement with dilated main pulmonary artery, suggestive of pulmonary arterial hypertension. 3.  Emphysema (ICD10-J43.9). 4.  Aortic atherosclerosis (ICD10-I70.0). Abdomen and pelvis: 1.  No acute intra-abdominal process. 2. Small ascites and mild anasarca. Electronically Signed   By: Titus Dubin M.D.   On: 09/21/2018 15:38   Dg Chest Port 1 View  Result Date: 09/22/2018 CLINICAL DATA:  63 year old female with pleural effusion EXAM: PORTABLE CHEST 1 VIEW COMPARISON:  09/21/2018 FINDINGS: Cardiomediastinal silhouette unchanged in size and contour. Endotracheal tube terminates suitably above the carina 5.7 cm. Gastric tube projects over the mediastinum and terminates out of the field of view. Background changes of emphysema Veil opacity in the right chest with partial obscuration of the visualized right hemidiaphragm. Unchanged from prior. Coarsened interstitial markings bilaterally. No pneumothorax. IMPRESSION: Unchanged appearance of the chest x-ray, with unchanged gastric  tube and endotracheal tube. Similar appearance of right-sided pleural effusion with associated atelectasis/consolidation. Background of emphysema Electronically Signed   By: Corrie Mckusick D.O.   On: 09/22/2018 10:43  Portable Chest X-ray  Result Date: 09/21/2018 CLINICAL DATA:  Endotracheal tube placement EXAM: PORTABLE CHEST 1 VIEW COMPARISON:  09/21/2018 FINDINGS: Endotracheal tube is 5.5 cm above the carina. Hyperinflation of the lungs. Layering right pleural effusion with right lower lobe airspace opacity. No focal opacity on the left. Heart is borderline in size. IMPRESSION: Endotracheal tube 5.5 cm above the carina. Moderate layering right effusion with right lower lobe atelectasis or infiltrate. Hyperinflation. Electronically Signed   By: Rolm Baptise M.D.   On: 09/21/2018 22:59   Dg Chest Portable 1 View  Result Date: 09/21/2018 CLINICAL DATA:  Shortness of breath, tube placement EXAM: PORTABLE CHEST 1 VIEW COMPARISON:  Portable exam 1957 hours compared to earlier study of 12 T4 hours FINDINGS: Tip of endotracheal tube projects 5.8 cm above carina. Nasogastric tube extends into stomach. Normal heart size, mediastinal contours, and pulmonary vascularity. Atherosclerotic calcification aorta. Emphysematous changes with decreased RIGHT pleural effusion and basilar atelectasis versus previous study. No new infiltrate or pneumothorax. Bones demineralized. IMPRESSION: Tube positions as above. COPD changes with decreased RIGHT pleural effusion and basilar atelectasis since earlier study. Electronically Signed   By: Lavonia Dana M.D.   On: 09/21/2018 20:42   Dg Chest Port 1 View  Result Date: 09/21/2018 CLINICAL DATA:  Onset hypoxia and respiratory distress today. Lower extremity swelling. EXAM: PORTABLE CHEST 1 VIEW COMPARISON:  PA and lateral chest 03/23/2014. FINDINGS: The chest is hyperexpanded with attenuation of the pulmonary vasculature. There is a small right pleural effusion and basilar airspace  disease. The left lung is clear. Heart size is normal. No pneumothorax. Aortic atherosclerosis noted. No acute bony abnormality. IMPRESSION: Right effusion and basilar airspace disease most consistent with pneumonia. Recommend follow-up to clearing. Emphysema. Atherosclerosis. Electronically Signed   By: Inge Rise M.D.   On: 09/21/2018 13:46   Vas [place] Lower Extremity Venous (dvt)  Result Date: 09/22/2018  Lower Venous Study Indications: Swelling.  Performing Technologist: Oliver Hum RVT  Examination Guidelines: A complete evaluation includes B-mode imaging, spectral Doppler, color Doppler, and power Doppler as needed of all accessible portions of each vessel. Bilateral testing is considered an integral part of a complete examination. Limited examinations for reoccurring indications may be performed as noted.  +---------+---------------+---------+-----------+----------+-------+  RIGHT     Compressibility Phasicity Spontaneity Properties Summary  +---------+---------------+---------+-----------+----------+-------+  CFV       Full            Yes       Yes                             +---------+---------------+---------+-----------+----------+-------+  SFJ       Full                                                      +---------+---------------+---------+-----------+----------+-------+  FV Prox   Full                                                      +---------+---------------+---------+-----------+----------+-------+  FV Mid    Full                                                      +---------+---------------+---------+-----------+----------+-------+  FV Distal Full                                                      +---------+---------------+---------+-----------+----------+-------+  PFV       Full                                                      +---------+---------------+---------+-----------+----------+-------+  POP       Full            Yes       Yes                              +---------+---------------+---------+-----------+----------+-------+  PTV       Full                                                      +---------+---------------+---------+-----------+----------+-------+  PERO      Full                                                      +---------+---------------+---------+-----------+----------+-------+   +---------+---------------+---------+-----------+----------+-------+  LEFT      Compressibility Phasicity Spontaneity Properties Summary  +---------+---------------+---------+-----------+----------+-------+  CFV       Full            Yes       Yes                             +---------+---------------+---------+-----------+----------+-------+  SFJ       Full                                                      +---------+---------------+---------+-----------+----------+-------+  FV Prox   Full                                                      +---------+---------------+---------+-----------+----------+-------+  FV Mid    Full                                                      +---------+---------------+---------+-----------+----------+-------+  FV Distal Full                                                      +---------+---------------+---------+-----------+----------+-------+  PFV       Full                                                      +---------+---------------+---------+-----------+----------+-------+  POP       Full            Yes       Yes                             +---------+---------------+---------+-----------+----------+-------+  PTV       Full                                                      +---------+---------------+---------+-----------+----------+-------+  PERO      Full                                                      +---------+---------------+---------+-----------+----------+-------+     Summary: Right: There is no evidence of deep vein thrombosis in the lower extremity. No cystic structure found in the popliteal fossa. Left: There is  no evidence of deep vein thrombosis in the lower extremity. No cystic structure found in the popliteal fossa.  *See table(s) above for measurements and observations. Electronically signed by Deitra Mayo MD on 09/22/2018 at 1:58:31 PM.    Final      Labs:   Basic Metabolic Panel: Recent Labs  Lab 09/21/18 2310 09/22/18 0600 09/23/18 0445 09/24/18 0412 09/25/18 0313  NA 131* 131* 135 133* 134*  K 5.0 4.8 3.1* 3.1* 4.3  CL 89* 88* 88* 81* 80*  CO2 28 22 35* 43* 43*  GLUCOSE 86 74 73 96 101*  BUN 54* 54* 27* 17 11  CREATININE 1.54* 1.36* 0.83 0.54 0.54  CALCIUM 7.7* 7.8* 7.4* 7.4* 7.8*  MG 1.7  --   --  1.5* 2.2  PHOS 5.9*  --   --  1.9* 2.0*   GFR Estimated Creatinine Clearance: 55.3 mL/min (by C-G formula based on SCr of 0.54 mg/dL). Liver Function Tests: Recent Labs  Lab 09/21/18 1243 09/22/18 0600 09/23/18 0445 09/24/18 0412 09/25/18 0313  AST 563* 676* 255* 193* 153*  ALT 479* 683* 480* 435* 386*  ALKPHOS 84 68 56 53 54  BILITOT 1.3* 1.8* 1.7* 1.5* 1.1  PROT 6.1* 4.4* 4.0* 4.2* 4.4*  ALBUMIN 3.6 2.8* 2.4* 2.5* 2.6*   No results for input(s): LIPASE, AMYLASE in the last 168 hours. No results for input(s): AMMONIA in the last 168 hours. Coagulation profile Recent Labs  Lab 09/21/18 2319 09/24/18 0412  INR 2.0* 1.2    CBC: Recent Labs  Lab 09/21/18 1243  09/21/18 1906 09/21/18 2150 09/22/18 0600 09/24/18 0412 09/25/18 0313  WBC 20.1*  --   --   --  24.1* 18.5* 16.6*  NEUTROABS 17.1*  --   --   --   --   --   --   HGB 15.9*   < > 17.7* 17.0* 15.0 13.2 12.9  HCT 53.0*   < > 52.0* 50.0* 45.7 42.6 42.6  MCV 90.0  --   --   --  83.7 87.3 89.7  PLT 343  --   --   --  253 173 151   < > = values in this interval not displayed.   Cardiac Enzymes: Recent Labs  Lab 09/21/18 1244 09/21/18 2310 09/22/18 0600  TROPONINI 0.06* 0.09* 0.08*   BNP: Invalid input(s): POCBNP CBG: Recent Labs  Lab 09/21/18 2323 09/22/18 0350 09/22/18 0738  09/22/18 1128 09/22/18 1535  GLUCAP 82 81 74 81 86   D-Dimer No results for input(s): DDIMER in the last 72 hours. Hgb A1c No results for input(s): HGBA1C in the last 72 hours. Lipid Profile No results for input(s): CHOL, HDL, LDLCALC, TRIG, CHOLHDL, LDLDIRECT in the last 72 hours. Thyroid function studies No results for input(s): TSH, T4TOTAL, T3FREE, THYROIDAB in the last 72 hours.  Invalid input(s): FREET3 Anemia work up No results for input(s): VITAMINB12, FOLATE, FERRITIN, TIBC, IRON, RETICCTPCT in the last 72 hours. Microbiology Recent Results (from the past 240 hour(s))  Culture, blood (routine x 2)     Status: None (Preliminary result)   Collection Time: 09/21/18 12:44 PM  Result Value Ref Range Status   Specimen Description   Final    BLOOD LEFT HAND Performed at [facility], [address] [place], [place] [address]    Special Requests   Final    BOTTLES DRAWN AEROBIC AND ANAEROBIC Blood Culture adequate volume Performed at [facility], [address] [place], [place] [address]    Culture   Final    NO GROWTH 4 DAYS Performed at [facility], [place] [address] [place], [place] [address]    Report Status PENDING  Incomplete  Culture, blood (routine x 2)     Status: None (Preliminary result)   Collection Time: 09/21/18  1:01 PM  Result Value Ref Range Status   Specimen Description   Final    RIGHT ANTECUBITAL Performed at [facility], [address] [place], [place] [address]    Special Requests   Final    BOTTLES DRAWN AEROBIC AND ANAEROBIC Blood Culture adequate volume Performed at [facility], [address] [place], [place] [address]    Culture   Final    NO GROWTH 4 DAYS Performed at [facility], [place] [address], [place], [place] [address]    Report Status PENDING  Incomplete  SARS Coronavirus 2 (Hosp order,Performed in [facility] lab via [employer])     Status: None   Collection Time:  09/21/18  1:01 PM  Result Value Ref Range Status   SARS Coronavirus 2 ([employer] Now) NEGATIVE NEGATIVE Final    Comment: (NOTE) Interpretive Result Comment(s): COVID 19 Positive SARS CoV 2 target nucleic acids are DETECTED. The SARS CoV 2 RNA is generally detectable in upper and lower respiratory specimens during the acute phase of infection.  Positive results are indicative of active infection with SARS CoV 2.  Clinical correlation with patient history and other diagnostic information is necessary to determine patient infection status.  Positive results do not rule out bacterial infection or coinfection with other viruses. The expected result is Negative. COVID 19 Negative SARS CoV 2 target nucleic acids are NOT DETECTED. The SARS CoV 2 RNA is generally detectable in upper and lower respiratory specimens during the acute phase of infection.  Negative  results do not preclude SARS CoV 2 infection, do not rule out coinfections with other pathogens, and should not be used as the sole basis for treatment or other patient management decisions.  Negative results must be combined with clinical  observations, patient history, and epidemiological information. The expected result is Negative. Invalid Presence or absence of SARS CoV 2 nucleic acids cannot be determined. Repeat testing was performed on the submitted specimen and repeated Invalid results were obtained.  If clinically indicated, additional testing on a new specimen with an alternate test methodology [phone]) is advised.  The SARS CoV 2 RNA is generally detectable in upper and lower respiratory specimens during the acute phase of infection. The expected result is Negative. Fact Sheet for Patients:  GolfingFamily.no Fact Sheet for Healthcare Providers: https://www.hernandez-brewer.com/ This test is not yet approved or cleared by the [place] FDA and has been authorized for detection  and/or diagnosis of SARS CoV 2 by FDA under an Emergency Use Authorization (EUA).  This EUA will remain in effect (meaning this test can be used) for the duration of the COVID19 d eclaration under Section 564(b)(1) of the Act, 21 U.S.C. section [phone] 3(b)(1), unless the authorization is terminated or revoked sooner. Performed at [facility], [address] [place], [place] [address]   MRSA PCR Screening     Status: None   Collection Time: 09/21/18  9:43 PM  Result Value Ref Range Status   MRSA by PCR NEGATIVE NEGATIVE Final    Comment:        The GeneXpert MRSA Assay (FDA approved for NASAL specimens only), is one component of a comprehensive MRSA colonization surveillance program. It is not intended to diagnose MRSA infection nor to guide or monitor treatment for MRSA infections. Performed at [facility], [place] [address] [address], [place] [address]      Discharge Instructions:   Discharge Instructions    Diet - low sodium heart healthy   Complete by:  As directed    Discharge instructions   Complete by:  As directed    Continue to use oxygen as prescribed. Follow up with your primary care physician within one week. Continue using inhalers and medications as prescribed.  You have been prescribed nicotine patch to help quit smoking.  Please have low-salt diet.  Fluid restriction to 50 ounces a day.  Weigh your self every day.  If you gain weight and develop swelling in your legs please discuss with your primary care physician. You will need to follow up with lung specialist in the future, please discuss with your primary care physician   Increase activity slowly   Complete by:  As directed      Allergies as of 09/25/2018      Reactions   Sulfa Antibiotics    Penicillins Rash      Medication List    STOP taking these medications   cefdinir 300 MG capsule Commonly known as:  OMNICEF     TAKE these medications   albuterol 108 (90 Base) MCG/ACT  inhaler Commonly known as:  VENTOLIN HFA Inhale 2 puffs into the lungs every 6 (six) hours as needed for wheezing or shortness of breath.   azithromycin 250 MG tablet Commonly known as:  Zithromax Z-Pak Take 2 tablets (500 mg) on  Day 1,  followed by 1 tablet (250 mg) once daily on Days 2 through 5.   benzonatate 100 MG capsule Commonly known as:  TESSALON Take 1-2 capsules (100-200  mg total) by mouth 3 (three) times daily as needed.   Fluticasone-Salmeterol 100-50 MCG/DOSE Aepb Commonly known as:  Advair Diskus Inhale 1 puff into the lungs 2 (two) times daily.   furosemide 20 MG tablet Commonly known as:  LASIX Take 1 tablet (20 mg total) by mouth every other day.   magnesium oxide 400 (241.3 Mg) MG tablet Commonly known as:  MAG-OX Take 1 tablet (400 mg total) by mouth 2 (two) times daily for 30 days.   nicotine 14 mg/24hr patch Commonly known as:  NICODERM CQ - dosed in mg/24 hours Place 1 patch (14 mg total) onto the skin daily.   potassium chloride SA 20 MEQ tablet Commonly known as:  K-DUR Take 1 tablet (20 mEq total) by mouth daily for 10 days.   tiotropium 18 MCG inhalation capsule Commonly known as:  Spiriva HandiHaler Place 1 capsule (18 mcg total) into inhaler and inhale daily for 30 days.            Durable Medical Equipment  (From admission, onward)         Start     Ordered   09/25/18 1010  DME Oxygen  Once    Question Answer Comment  Mode or (Route) Nasal cannula   Liters per Minute 2   Frequency Continuous (stationary and portable oxygen unit needed)   Oxygen conserving device Yes   Oxygen delivery system Gas      09/25/18 1013         Follow-up Information    [facility].   Specialty:  Home Health Services Contact information: [place] [address] [phone]            Time coordinating discharge: 39 minutes  Signed:  Karry Barrilleaux  [facility] 09/25/2018, 10:13  AM

## 2018-09-25 NOTE — TOC Transition Note (Addendum)
Transition of Care [facility]) - CM/SW Discharge Note   Patient Details  Name: Evelyn Hughes MRN: [id number] Date of Birth: [birth year]  [facility] CM/SW Contact:  Bartholomew Crews, RN Phone Number: [phone] 09/25/2018, 11:59 AM   Clinical Narrative:    Patient to transition home today. Notified [facility] who f/u for PT and RN. Referral placed to [facility] for oxygen. Hospital f/u appointment scheduled with [facility] for 10/05/2018 at 1430. No other transition of care needs identified at this time.  Final next level of care: Home w Home Health Services Barriers to Discharge: No Barriers Identified   Patient Goals and CMS Choice Patient states their goals for this hospitalization and ongoing recovery are:: to return home   Choice offered to / list presented to : Patient  Discharge Placement    Home                   Discharge Plan and Services     Post Acute Care Choice: Home Health, Durable Medical Equipment          DME Arranged: Oxygen DME Agency: [facility] Date DME Agency Contacted: 09/25/18 Time DME Agency Contacted: [id number] Representative spoke with at DME Agency: Thedore Mins  HH Arranged: RN, PT [place] Agency: [place] Date [place]: 09/25/18 Time [place]: 1159 Representative spoke with at [place]: [place] ([place]) Interventions     Readmission Risk Interventions No flowsheet data found.

## 2018-09-26 ENCOUNTER — Telehealth: Payer: Self-pay

## 2018-09-26 NOTE — Telephone Encounter (Signed)
Notified by 74M nurse that spouse Evelyn Hughes at [phone] called unit because home concentrator had not been delivered and portable concentrator not working, and portable tank empty. Nurse stated she provided him w [employer] main number. When I called Evelyn Hughes he said he had spoken w a "very nice" lady at [place] and she helped him through getting the portable concentrator to work. He verified that patient was currently on oxygen at the time of my call. He stated that concentrator would be delivered soon. Spoke w Evelyn Hughes from [place] and requested [facility] see patient today, Evelyn Hughes replied he would work on getting that set up. Spoke with Evelyn Hughes at [place] who stated he would look into ensuring that staff who delivers oxygen to patients prior to DC are making sure they understand how to use equipment.  Spoke w Evelyn Hughes again to update him that [facility] RN would be calling today to come see patient, he stated that [employer] was at his house delivering concentrator. He was appreciate of help.

## 2018-09-27 ENCOUNTER — Inpatient Hospital Stay (HOSPITAL_COMMUNITY)
Admission: EM | Admit: 2018-09-27 | Discharge: 2018-10-06 | Disposition: A | Discharge status: Home / Self Care | Payer: BC Managed Care – PPO | Source: Home / Self Care | Attending: Internal Medicine | Admitting: Internal Medicine

## 2018-09-27 ENCOUNTER — Other Ambulatory Visit: Payer: Self-pay

## 2018-09-27 ENCOUNTER — Emergency Department (HOSPITAL_COMMUNITY): Payer: BC Managed Care – PPO

## 2018-09-27 ENCOUNTER — Encounter (HOSPITAL_COMMUNITY): Payer: Self-pay | Admitting: Oncology

## 2018-09-27 DIAGNOSIS — J9601 Acute respiratory failure with hypoxia: Secondary | ICD-10-CM

## 2018-09-27 DIAGNOSIS — J9600 Acute respiratory failure, unspecified whether with hypoxia or hypercapnia: Secondary | ICD-10-CM

## 2018-09-27 DIAGNOSIS — L8990 Pressure ulcer of unspecified site, unspecified stage: Secondary | ICD-10-CM

## 2018-09-27 DIAGNOSIS — J9692 Respiratory failure, unspecified with hypercapnia: Secondary | ICD-10-CM

## 2018-09-27 DIAGNOSIS — J9602 Acute respiratory failure with hypercapnia: Secondary | ICD-10-CM

## 2018-09-27 DIAGNOSIS — J9691 Respiratory failure, unspecified with hypoxia: Secondary | ICD-10-CM | POA: Diagnosis present

## 2018-09-27 DIAGNOSIS — I5030 Unspecified diastolic (congestive) heart failure: Secondary | ICD-10-CM

## 2018-09-27 LAB — CBC
HCT: 44.2 % (ref 36.0–46.0)
Hemoglobin: 13.6 g/dL (ref 12.0–15.0)
MCH: 28.5 pg (ref 26.0–34.0)
MCHC: 30.8 g/dL (ref 30.0–36.0)
MCV: 92.5 fL (ref 80.0–100.0)
Platelets: 207 10*3/uL (ref 150–400)
RBC: 4.78 MIL/uL (ref 3.87–5.11)
RDW: 14.5 % (ref 11.5–15.5)
WBC: 13.8 10*3/uL — ABNORMAL HIGH (ref 4.0–10.5)
nRBC: 0 % (ref 0.0–0.2)

## 2018-09-27 LAB — COMPREHENSIVE METABOLIC PANEL
ALT: 263 U/L — ABNORMAL HIGH (ref 0–44)
ALT: 234 U/L — ABNORMAL HIGH (ref 0–44)
AST: 68 U/L — ABNORMAL HIGH (ref 15–41)
AST: 90 U/L — ABNORMAL HIGH (ref 15–41)
Albumin: 2.9 g/dL — ABNORMAL LOW (ref 3.5–5.0)
Albumin: 2.7 g/dL — ABNORMAL LOW (ref 3.5–5.0)
Alkaline Phosphatase: 68 U/L (ref 38–126)
Alkaline Phosphatase: 70 U/L (ref 38–126)
Anion gap: 13 (ref 5–15)
Anion gap: 17 — ABNORMAL HIGH (ref 5–15)
BUN: 19 mg/dL (ref 8–23)
BUN: 21 mg/dL (ref 8–23)
CO2: 41 mmol/L — ABNORMAL HIGH (ref 22–32)
CO2: 30 mmol/L (ref 22–32)
Calcium: 8.7 mg/dL — ABNORMAL LOW (ref 8.9–10.3)
Calcium: 8.3 mg/dL — ABNORMAL LOW (ref 8.9–10.3)
Chloride: 82 mmol/L — ABNORMAL LOW (ref 98–111)
Chloride: 83 mmol/L — ABNORMAL LOW (ref 98–111)
Creatinine, Ser: 0.5 mg/dL (ref 0.44–1.00)
Creatinine, Ser: 0.47 mg/dL (ref 0.44–1.00)
GFR calc Af Amer: >60 mL/min (ref >60)
GFR calc Af Amer: >60 mL/min (ref >60)
GFR calc non Af Amer: >60 mL/min (ref >60)
GFR calc non Af Amer: >60 mL/min (ref >60)
Glucose, Bld: 106 mg/dL — ABNORMAL HIGH (ref 70–99)
Glucose, Bld: 122 mg/dL — ABNORMAL HIGH (ref 70–99)
Potassium: 4.9 mmol/L (ref 3.5–5.1)
Potassium: 5.3 mmol/L — ABNORMAL HIGH (ref 3.5–5.1)
Sodium: 136 mmol/L (ref 135–145)
Sodium: 130 mmol/L — ABNORMAL LOW (ref 135–145)
Total Bilirubin: 1.1 mg/dL (ref 0.3–1.2)
Total Bilirubin: 2.5 mg/dL — ABNORMAL HIGH (ref 0.3–1.2)
Total Protein: 5.2 g/dL — ABNORMAL LOW (ref 6.5–8.1)

## 2018-09-27 LAB — POCT I-STAT 7, (LYTES, BLD GAS, ICA,H+H)
Acid-Base Excess: 18 mmol/L — ABNORMAL HIGH (ref 0.0–2.0)
Acid-Base Excess: 11 mmol/L — ABNORMAL HIGH (ref 0.0–2.0)
Bicarbonate: 45.3 mmol/L — ABNORMAL HIGH (ref 20.0–28.0)
Bicarbonate: 37.1 mmol/L — ABNORMAL HIGH (ref 20.0–28.0)
Calcium, Ion: 1.11 mmol/L — ABNORMAL LOW (ref 1.15–1.40)
Calcium, Ion: 1.14 mmol/L — ABNORMAL LOW (ref 1.15–1.40)
HCT: 52 % — ABNORMAL HIGH (ref 36.0–46.0)
HCT: 48 % — ABNORMAL HIGH (ref 36.0–46.0)
Hemoglobin: 17.7 g/dL — ABNORMAL HIGH (ref 12.0–15.0)
Hemoglobin: 16.3 g/dL — ABNORMAL HIGH (ref 12.0–15.0)
O2 Saturation: 100 %
O2 Saturation: 88 %
Patient temperature: 98.5
Patient temperature: 101.1
Potassium: 4.6 mmol/L (ref 3.5–5.1)
Potassium: 4 mmol/L (ref 3.5–5.1)
Sodium: 130 mmol/L — ABNORMAL LOW (ref 135–145)
Sodium: 133 mmol/L — ABNORMAL LOW (ref 135–145)
TCO2: 47 mmol/L — ABNORMAL HIGH (ref 22–32)
TCO2: 39 mmol/L — ABNORMAL HIGH (ref 22–32)
pCO2 arterial: 55.4 mmHg — ABNORMAL HIGH (ref 32.0–48.0)
pCO2 arterial: 54.6 mmHg — ABNORMAL HIGH (ref 32.0–48.0)
pH, Arterial: 7.52 — ABNORMAL HIGH (ref 7.350–7.450)
pH, Arterial: 7.446 (ref 7.350–7.450)
pO2, Arterial: 417 mmHg — ABNORMAL HIGH (ref 83.0–108.0)
pO2, Arterial: 59 mmHg — ABNORMAL LOW (ref 83.0–108.0)

## 2018-09-27 LAB — URINALYSIS, ROUTINE W REFLEX MICROSCOPIC
Bacteria, UA: NONE SEEN
Bilirubin Urine: NEGATIVE
Glucose, UA: NEGATIVE mg/dL
Hgb urine dipstick: NEGATIVE
Ketones, ur: 5 mg/dL — AB
Leukocytes,Ua: NEGATIVE
Nitrite: NEGATIVE
Protein, ur: 30 mg/dL — AB
Specific Gravity, Urine: 1.019 (ref 1.005–1.030)
pH: 6 (ref 5.0–8.0)

## 2018-09-27 LAB — CBC WITH DIFFERENTIAL/PLATELET
Abs Immature Granulocytes: 0.08 10*3/uL — ABNORMAL HIGH (ref 0.00–0.07)
Basophils Absolute: 0 10*3/uL (ref 0.0–0.1)
Basophils Relative: 0 %
Eosinophils Absolute: 0 10*3/uL (ref 0.0–0.5)
Eosinophils Relative: 0 %
HCT: 50.3 % — ABNORMAL HIGH (ref 36.0–46.0)
Hemoglobin: 14.2 g/dL (ref 12.0–15.0)
Immature Granulocytes: 1 %
Lymphocytes Relative: 5 %
Lymphs Abs: 0.7 10*3/uL (ref 0.7–4.0)
MCH: 26.5 pg (ref 26.0–34.0)
MCHC: 28.2 g/dL — ABNORMAL LOW (ref 30.0–36.0)
MCV: 94 fL (ref 80.0–100.0)
Monocytes Absolute: 1.7 10*3/uL — ABNORMAL HIGH (ref 0.1–1.0)
Monocytes Relative: 12 %
Neutro Abs: 12.1 10*3/uL — ABNORMAL HIGH (ref 1.7–7.7)
Neutrophils Relative %: 82 %
Platelets: 216 10*3/uL (ref 150–400)
RBC: 5.35 MIL/uL — ABNORMAL HIGH (ref 3.87–5.11)
RDW: 14.1 % (ref 11.5–15.5)
WBC: 14.7 10*3/uL — ABNORMAL HIGH (ref 4.0–10.5)
nRBC: 0 % (ref 0.0–0.2)

## 2018-09-27 LAB — PHOSPHORUS: Phosphorus: 2.3 mg/dL — ABNORMAL LOW (ref 2.5–4.6)

## 2018-09-27 LAB — GLUCOSE, CAPILLARY
Glucose-Capillary: 160 mg/dL — ABNORMAL HIGH (ref 70–99)
Glucose-Capillary: 161 mg/dL — ABNORMAL HIGH (ref 70–99)
Glucose-Capillary: 217 mg/dL — ABNORMAL HIGH (ref 70–99)

## 2018-09-27 LAB — PROTIME-INR
INR: 1 (ref 0.8–1.2)
Prothrombin Time: 12.8 seconds (ref 11.4–15.2)

## 2018-09-27 LAB — LACTIC ACID, PLASMA
Lactic Acid, Venous: 0.7 mmol/L (ref 0.5–1.9)
Lactic Acid, Venous: 2 mmol/L (ref 0.5–1.9)

## 2018-09-27 LAB — CULTURE, BLOOD (ROUTINE X 2)
Culture: NO GROWTH
Culture: NO GROWTH
Special Requests: ADEQUATE
Special Requests: ADEQUATE

## 2018-09-27 LAB — PROCALCITONIN: Procalcitonin: 0.17 ng/mL

## 2018-09-27 LAB — BRAIN NATRIURETIC PEPTIDE: B Natriuretic Peptide: 1852.5 pg/mL — ABNORMAL HIGH (ref 0.0–100.0)

## 2018-09-27 LAB — SARS CORONAVIRUS 2 BY RT PCR (HOSPITAL ORDER, PERFORMED IN ~~LOC~~ HOSPITAL LAB): SARS Coronavirus 2: NEGATIVE

## 2018-09-27 LAB — MAGNESIUM: Magnesium: 2.2 mg/dL (ref 1.7–2.4)

## 2018-09-27 LAB — TROPONIN I: Troponin I: <0.03 ng/mL (ref <0.03)

## 2018-09-27 LAB — STREP PNEUMONIAE URINARY ANTIGEN: Strep Pneumo Urinary Antigen: NEGATIVE

## 2018-09-27 LAB — MRSA PCR SCREENING: MRSA by PCR: NEGATIVE

## 2018-09-27 MED ORDER — ORAL CARE MOUTH RINSE
15.0000 mL | OROMUCOSAL | Status: DC
  Administered 2018-09-27 – 2018-09-29 (×18): 15 mL via OROMUCOSAL

## 2018-09-27 MED ORDER — FAMOTIDINE 40 MG/5ML PO SUSR
20.0000 mg | Freq: Two times a day (BID) | ORAL | Status: DC
  Administered 2018-09-27 – 2018-09-28 (×4): 20 mg
  Filled 2018-09-27 (×5): qty 2.5

## 2018-09-27 MED ORDER — MIDAZOLAM HCL 2 MG/2ML IJ SOLN
2.0000 mg | Freq: Once | INTRAMUSCULAR | Status: AC
  Administered 2018-09-27: 2 mg via INTRAVENOUS
  Filled 2018-09-27: qty 2

## 2018-09-27 MED ORDER — CHLORHEXIDINE GLUCONATE 0.12% ORAL RINSE (MEDLINE KIT)
15.0000 mL | Freq: Two times a day (BID) | OROMUCOSAL | Status: DC
  Administered 2018-09-27 – 2018-09-29 (×5): 15 mL via OROMUCOSAL

## 2018-09-27 MED ORDER — SODIUM CHLORIDE 0.9 % IV BOLUS
1000.0000 mL | Freq: Once | INTRAVENOUS | Status: AC
  Administered 2018-09-27: 09:00:00 1000 mL via INTRAVENOUS

## 2018-09-27 MED ORDER — FENTANYL CITRATE (PF) 100 MCG/2ML IJ SOLN
50.0000 ug | Freq: Once | INTRAMUSCULAR | Status: AC
  Administered 2018-09-27: 50 ug via INTRAVENOUS

## 2018-09-27 MED ORDER — FENTANYL BOLUS VIA INFUSION
50.0000 ug | INTRAVENOUS | Status: DC | PRN
  Filled 2018-09-27: qty 50

## 2018-09-27 MED ORDER — PROPOFOL 1000 MG/100ML IV EMUL
5.0000 ug/kg/min | INTRAVENOUS | Status: DC
  Administered 2018-09-27: 5 ug/kg/min via INTRAVENOUS

## 2018-09-27 MED ORDER — SODIUM CHLORIDE 0.9 % IV SOLN: 2.0000 g | Freq: Once | INTRAVENOUS | Status: DC

## 2018-09-27 MED ORDER — VANCOMYCIN HCL IN DEXTROSE 1-5 GM/200ML-% IV SOLN
1000.0000 mg | Freq: Once | INTRAVENOUS | Status: AC
  Administered 2018-09-27: 08:00:00 1000 mg via INTRAVENOUS
  Filled 2018-09-27: qty 200

## 2018-09-27 MED ORDER — IPRATROPIUM-ALBUTEROL 0.5-2.5 (3) MG/3ML IN SOLN: 3.0000 mL | RESPIRATORY_TRACT | Status: DC | PRN

## 2018-09-27 MED ORDER — ETOMIDATE 2 MG/ML IV SOLN
INTRAVENOUS | Status: AC | PRN
  Administered 2018-09-27: 10 mg via INTRAVENOUS

## 2018-09-27 MED ORDER — SODIUM CHLORIDE 0.9 % IV SOLN
2.0000 g | Freq: Three times a day (TID) | INTRAVENOUS | Status: AC
  Administered 2018-09-27 – 2018-10-01 (×15): 2 g via INTRAVENOUS
  Filled 2018-09-27 (×15): qty 2

## 2018-09-27 MED ORDER — FENTANYL 2500MCG IN NS 250ML (10MCG/ML) PREMIX INFUSION
50.0000 ug/h | INTRAVENOUS | Status: DC
  Administered 2018-09-27: 50 ug/h via INTRAVENOUS
  Administered 2018-09-29: 100 ug/h via INTRAVENOUS
  Filled 2018-09-27 (×2): qty 250

## 2018-09-27 MED ORDER — ROCURONIUM BROMIDE 50 MG/5ML IV SOLN
INTRAVENOUS | Status: AC | PRN
  Administered 2018-09-27: 50 mg via INTRAVENOUS

## 2018-09-27 MED ORDER — PROPOFOL 1000 MG/100ML IV EMUL
INTRAVENOUS | Status: AC
  Filled 2018-09-27: qty 100

## 2018-09-27 MED ORDER — HEPARIN SODIUM (PORCINE) 5000 UNIT/ML IJ SOLN
5000.0000 [IU] | Freq: Three times a day (TID) | INTRAMUSCULAR | Status: DC
  Administered 2018-09-27 – 2018-10-04 (×21): 5000 [IU] via SUBCUTANEOUS
  Filled 2018-09-27 (×21): qty 1

## 2018-09-27 MED ORDER — VANCOMYCIN HCL IN DEXTROSE 750-5 MG/150ML-% IV SOLN
750.0000 mg | Freq: Two times a day (BID) | INTRAVENOUS | Status: DC
  Administered 2018-09-27 – 2018-09-29 (×4): 750 mg via INTRAVENOUS
  Filled 2018-09-27 (×4): qty 150

## 2018-09-27 MED ORDER — NOREPINEPHRINE 4 MG/250ML-% IV SOLN
2.0000 ug/min | INTRAVENOUS | Status: DC
  Administered 2018-09-27: 2 ug/min via INTRAVENOUS
  Filled 2018-09-27: qty 250

## 2018-09-27 MED ORDER — SODIUM CHLORIDE 0.9 % IV SOLN
250.0000 mL | INTRAVENOUS | Status: DC
  Administered 2018-10-05: 250 mL via INTRAVENOUS

## 2018-09-27 MED ORDER — IPRATROPIUM-ALBUTEROL 0.5-2.5 (3) MG/3ML IN SOLN
3.0000 mL | Freq: Four times a day (QID) | RESPIRATORY_TRACT | Status: DC
  Administered 2018-09-27 – 2018-09-28 (×4): 3 mL via RESPIRATORY_TRACT
  Filled 2018-09-27 (×5): qty 3

## 2018-09-27 MED ORDER — ACETAMINOPHEN 325 MG PO TABS
650.0000 mg | ORAL_TABLET | ORAL | Status: DC | PRN
  Administered 2018-09-27: 650 mg via ORAL
  Filled 2018-09-27: qty 2

## 2018-09-27 MED ORDER — ALBUTEROL (5 MG/ML) CONTINUOUS INHALATION SOLN
10.0000 mg/h | INHALATION_SOLUTION | RESPIRATORY_TRACT | Status: AC
  Administered 2018-09-27: 09:00:00 10 mg/h via RESPIRATORY_TRACT
  Filled 2018-09-27: qty 20

## 2018-09-27 MED ORDER — INSULIN ASPART 100 UNIT/ML ~~LOC~~ SOLN
0.0000 [IU] | SUBCUTANEOUS | Status: DC
  Administered 2018-09-27: 3 [IU] via SUBCUTANEOUS
  Administered 2018-09-27: 5 [IU] via SUBCUTANEOUS
  Administered 2018-09-27: 13:00:00 3 [IU] via SUBCUTANEOUS
  Administered 2018-09-28 (×4): 2 [IU] via SUBCUTANEOUS
  Administered 2018-09-28: 3 [IU] via SUBCUTANEOUS
  Administered 2018-09-29: 2 [IU] via SUBCUTANEOUS

## 2018-09-27 MED ORDER — ONDANSETRON HCL 4 MG/2ML IJ SOLN: 4.0000 mg | Freq: Four times a day (QID) | INTRAMUSCULAR | Status: DC | PRN

## 2018-09-27 MED ORDER — KETAMINE HCL 50 MG/5ML IJ SOSY
0.5000 mg/kg | PREFILLED_SYRINGE | Freq: Once | INTRAMUSCULAR | Status: AC
  Administered 2018-09-27: 24 mg via INTRAVENOUS
  Filled 2018-09-27: qty 5

## 2018-09-27 MED ORDER — ALBUTEROL SULFATE (2.5 MG/3ML) 0.083% IN NEBU: 2.5000 mg | INHALATION_SOLUTION | RESPIRATORY_TRACT | Status: DC | PRN

## 2018-09-27 MED ORDER — METHYLPREDNISOLONE SODIUM SUCC 125 MG IJ SOLR
125.0000 mg | Freq: Once | INTRAMUSCULAR | Status: AC
  Administered 2018-09-27: 09:00:00 125 mg via INTRAVENOUS
  Filled 2018-09-27: qty 2

## 2018-09-27 MED ORDER — SODIUM CHLORIDE 0.9 % IV SOLN
INTRAVENOUS | Status: DC
  Administered 2018-09-27: 15:00:00 via INTRAVENOUS

## 2018-09-27 MED ORDER — METHYLPREDNISOLONE SODIUM SUCC 125 MG IJ SOLR
80.0000 mg | Freq: Two times a day (BID) | INTRAMUSCULAR | Status: DC
  Administered 2018-09-27 – 2018-09-29 (×5): 80 mg via INTRAVENOUS
  Filled 2018-09-27 (×5): qty 2

## 2018-09-27 NOTE — Progress Notes (Signed)
Pharmacy Antibiotic Note  Evelyn Hughes is a 63 y.o. female admitted on 09/27/2018 with SOB/HCAP/sepsis.  Pharmacy has been consulted for Vancomycin and Aztreonam dosing.  Vancomycin 1 g IV given in ED at    Plan: Vancomycin 1 g IV now, then 750 mg IV q12h Pt reports rash to PCN and has tolerated Rocephin in past, so change Aztreonam to Cefepime 2 g IV q8h F/U renal fxn  Height: 5\' 8"  (172.7 cm) Weight: 105 lb 13.1 oz (48 kg) IBW/kg (Calculated) : 63.9  Temp (24hrs), Avg:97.3 F (36.3 C), Min:97.2 F (36.2 C), Max:97.4 F (36.3 C)  Recent Labs  Lab 09/21/18 1243 09/21/18 1244 09/21/18 1516 09/21/18 1852 09/21/18 2310 09/22/18 0600 09/23/18 0445 09/24/18 0412 09/25/18 0313  WBC 20.1*  --   --   --   --  24.1*  --  18.5* 16.6*  CREATININE 1.92*  --   --   --  1.54* 1.36* 0.83 0.54 0.54  LATICACIDVEN  --  3.7* 2.1* 1.6  --   --   --   --   --     Estimated Creatinine Clearance: 55.3 mL/min (by C-G formula based on SCr of 0.54 mg/dL).    Allergies  Allergen Reactions  . Sulfa Antibiotics   . Penicillins Rash    Caryl Pina 09/27/2018 7:49 AM

## 2018-09-27 NOTE — Progress Notes (Addendum)
Decreased urinary output. Foley cath in place. Bladder scan shows no urine retention. NP Groce updated. Ordered urine culture. Urine is malodorous. Sent to lab.

## 2018-09-27 NOTE — ED Notes (Signed)
[name] Linhardt (husband) cell# [phone]. Husband requested only being contacted at this number.

## 2018-09-27 NOTE — ED Triage Notes (Signed)
Pt bib [facility] from home d/t AMS.  Per EMS pt's spouse reported pt released from hospital on 5/8 d/t "Fluid on her lung."   Pt was intubated during last hospitalization.  Per pt's spouse pt has been weak and lethargic since d/c on 5/8.  Spouse reports that pt became increasingly confused prompting him to call.  Pt is responsive to pain.  Breathing is labored and shallow.

## 2018-09-27 NOTE — ED Notes (Signed)
ED Provider at bedside. 

## 2018-09-27 NOTE — ED Provider Notes (Signed)
[facility] EMERGENCY DEPARTMENT Provider Note   CSN: [id number] Arrival date & time: 09/27/18  9323    History   Chief Complaint Chief Complaint  Patient presents with   Altered Mental Status    HPI Evelyn Hughes is a 63 y.o. female.     HPI  The patient is an ill-appearing 63 year old female, recently admitted to the hospital and ultimately diagnosed with a combination of what was likely cor pulmonale and acute hypoxic respiratory distress and failure requiring intubation.  She self extubated and was ultimately placed on nasal cannula discharged home.  Per the husband's report and per EMS report the patient has had a gradual decline especially over the last 12 to 24 hours refusing to eat or drink and going back into a significant altered mental state.  Paramedics found the patient to be hypoxic, added increased oxygen and brought her to the hospital.  The patient is not able to give any information secondary to altered mental status, level 5 caveat applies.  Past Medical History:  Diagnosis Date   Allergy     Patient Active Problem List   Diagnosis Date Noted   Stage I pressure ulcer of left buttock 09/23/2018   Pleural effusion on right    Other secondary pulmonary hypertension (HCC)    Acute renal failure ([place])    Acute respiratory failure with hypoxia and hypercapnia ([place]) 09/21/2018   Acute respiratory failure ([place]) 09/21/2018    History reviewed. No pertinent surgical history.   OB History    Gravida  2   Para      Term      Preterm      AB      Living  2     SAB      TAB      Ectopic      Multiple      Live Births               Home Medications    Prior to Admission medications   Medication Sig Start Date End Date Taking? Authorizing Provider  albuterol (VENTOLIN HFA) 108 (90 Base) MCG/ACT inhaler Inhale 2 puffs into the lungs every 6 (six) hours as needed for wheezing or shortness of breath. 09/25/18    Pokhrel, Corrie Mckusick, MD  azithromycin (ZITHROMAX Z-PAK) 250 MG tablet Take 2 tablets (500 mg) on  Day 1,  followed by 1 tablet (250 mg) once daily on Days 2 through 5. 09/25/18 09/30/18  Pokhrel, Corrie Mckusick, MD  benzonatate (TESSALON) 100 MG capsule Take 1-2 capsules (100-200 mg total) by mouth 3 (three) times daily as needed. 03/23/14   Posey Boyer, MD  Fluticasone-Salmeterol (ADVAIR DISKUS) 100-50 MCG/DOSE AEPB Inhale 1 puff into the lungs 2 (two) times daily. 09/25/18 09/25/19  Pokhrel, Corrie Mckusick, MD  furosemide (LASIX) 20 MG tablet Take 1 tablet (20 mg total) by mouth every other day. 09/25/18 11/24/18  Pokhrel, Corrie Mckusick, MD  magnesium oxide (MAG-OX) 400 (241.3 Mg) MG tablet Take 1 tablet (400 mg total) by mouth 2 (two) times daily for 30 days. 09/25/18 10/25/18  Pokhrel, Corrie Mckusick, MD  nicotine (NICODERM CQ - DOSED IN MG/24 HOURS) 14 mg/24hr patch Place 1 patch (14 mg total) onto the skin daily. 09/25/18 09/25/19  Pokhrel, Corrie Mckusick, MD  potassium chloride SA (K-DUR) 20 MEQ tablet Take 1 tablet (20 mEq total) by mouth daily for 10 days. 09/25/18 10/05/18  Pokhrel, Corrie Mckusick, MD  tiotropium (SPIRIVA HANDIHALER) 18 MCG inhalation capsule Place 1 capsule (18  mcg total) into inhaler and inhale daily for 30 days. 09/25/18 10/25/18  Pokhrel, Corrie Mckusick, MD    Family History Family History  Problem Relation Age of Onset   Cancer Mother     Social History Social History   Tobacco Use   Smoking status: Current Every Day Smoker   Smokeless tobacco: Never Used  Substance Use Topics   Alcohol use: No    Alcohol/week: 0.0 standard drinks   Drug use: No     Allergies   Sulfa antibiotics and Penicillins   Review of Systems Review of Systems  Unable to perform ROS: Acuity of condition     Physical Exam Updated Vital Signs BP 125/71 (BP Location: Right Arm)    Pulse (!) 107    Temp (!) 97.4 F (36.3 C) (Rectal)    Resp (!) 28    Ht 1.727 m (_0 )    Wt 48 kg    SpO2 94%    BMI 16.09 kg/m   Physical Exam Vitals signs and  nursing note reviewed.  Constitutional:      General: She is in acute distress.     Appearance: She is well-developed. She is ill-appearing and toxic-appearing.     Comments: The patient is occasionally opening her eyes spontaneously, she does not respond to voice, does not speak  HENT:     Head: Normocephalic and atraumatic.     Mouth/Throat:     Mouth: Mucous membranes are dry.  Eyes:     General: No scleral icterus.       Right eye: No discharge.        Left eye: No discharge.     Conjunctiva/sclera: Conjunctivae normal.     Pupils: Pupils are equal, round, and reactive to light.  Neck:     Musculoskeletal: Normal range of motion and neck supple.     Thyroid: No thyromegaly.     Vascular: No JVD.     Comments: JVD present bilaterally Cardiovascular:     Rate and Rhythm: Regular rhythm. Tachycardia present.     Heart sounds: Normal heart sounds. No murmur. No friction rub. No gallop.   Pulmonary:     Effort: Respiratory distress present.     Breath sounds: Wheezing and rales present.     Comments: Increased work of breathing with some accessory muscle use, decreased lung sounds, expiratory wheezing, occasional rales at the bases, respiratory distress present Abdominal:     General: Bowel sounds are normal. There is no distension.     Palpations: Abdomen is soft. There is no mass.     Tenderness: There is no abdominal tenderness.  Musculoskeletal: Normal range of motion.        General: No tenderness.     Right lower leg: Edema present.     Left lower leg: Edema present.     Comments: Symmetrical edema at the ankles and feet  Lymphadenopathy:     Cervical: No cervical adenopathy.  Skin:    General: Skin is warm and dry.     Findings: No erythema or rash.  Neurological:     Comments: The patient appears to be moving all 4 extremities but not to command, she has no obvious facial droop.  She will not speak.  She does respond to painful stimuli only      ED Treatments /  Results  Labs (all labs ordered are listed, but only abnormal results are displayed) Labs Reviewed  SARS CORONAVIRUS 2 ([place]  [facility])    EKG EKG Interpretation  Date/Time:  Sunday Sep 27 2018 07:13:05 EDT Ventricular Rate:  104 PR Interval:    QRS Duration: 72 QT Interval:  280 QTC Calculation: 369 R Axis:   115 Text Interpretation:  Sinus tachycardia Right atrial enlargement Right axis deviation Abnormal R-wave progression, late transition Abnormal lateral Q waves Borderline ST elevation, anterior leads T wave inversions in the anterior precordial leads no longer present Confirmed by Noemi Chapel [phone]) on 09/27/2018 7:16:58 AM   Radiology Dg Chest Portable 1 View  Result Date: 09/27/2018 CLINICAL DATA:  Respiratory distress. EXAM: PORTABLE CHEST 1 VIEW COMPARISON:  09/23/2018 FINDINGS: The cardiomediastinal silhouette is within normal limits for portable AP technique. The lungs are hyperinflated with chronic interstitial coarsening in the setting of known emphysema. Small pleural effusions persist, right slightly larger than left. Mild bibasilar lung opacities remain, however there is improved aeration of the right lung base with the denser consolidative opacity on the prior study having resolved. Mild patchy opacity in the right mid lung has slightly increased. No pneumothorax is identified. No acute osseous abnormality is seen. IMPRESSION: 1. Persistent small bilateral pleural effusions and bibasilar atelectasis. 2. Improved right basilar lung aeration. Electronically Signed   By: Logan Bores M.D.   On: 09/27/2018 07:25    Procedures Procedure Name: Intubation Date/Time: 09/27/2018 7:53 AM Performed by: Noemi Chapel, MD Pre-anesthesia Checklist: Patient identified, Patient being monitored, Emergency Drugs available, Timeout performed and Suction available Oxygen Delivery Method: Non-rebreather mask Preoxygenation: Pre-oxygenation with  100% oxygen Induction Type: Rapid sequence Ventilation: Mask ventilation without difficulty Laryngoscope Size: 4 and Mac Grade View: Grade IV Tube size: 7.5 mm Number of attempts: 1 Intubation method: direct laryngoscopy with malleable stylet. Placement Confirmation: ETT inserted through vocal cords under direct vision,  CO2 detector and Breath sounds checked- equal and bilateral Secured at: 24 cm Tube secured with: ETT holder Dental Injury: Teeth and Oropharynx as per pre-operative assessment  Difficulty Due To: Difficulty was unanticipated Comments:      .Critical Care Performed by: Noemi Chapel, MD Authorized by: Noemi Chapel, MD   Critical care provider statement:    Critical care time (minutes):  35   Critical care time was exclusive of:  Separately billable procedures and treating other patients and teaching time   Critical care was necessary to treat or prevent imminent or life-threatening deterioration of the following conditions:  Respiratory failure   Critical care was time spent personally by me on the following activities:  Blood draw for specimens, development of treatment plan with patient or surrogate, discussions with consultants, evaluation of patient's response to treatment, examination of patient, obtaining history from patient or surrogate, ordering and performing treatments and interventions, ordering and review of laboratory studies, ordering and review of radiographic studies, pulse oximetry, re-evaluation of patient's condition and review of old charts Comments:         (including critical care time)  Medications Ordered in ED Medications - No data to display   Initial Impression / Assessment and Plan / ED Course  I have reviewed the triage vital signs and the nursing notes.  Pertinent labs & imaging results that were available during my care of the patient were reviewed by me and considered in my medical decision making (see chart for details).          Ill-appearing patient, she is critically ill, she will need a full work-up including search for respiratory pathogens including pneumonia, recurrent effusion, this could  be severe COPD and hypercapnia, consider hypoxic respiratory failure recurrent, electrolyte abnormalities, recurrent acute renal failure.  Either way the patient is critically ill and will need higher level of care in the hospital.  Portable chest x-ray and labs, may need intubation.   I personally viewed and interpreted the x-ray of the chest, there appears to be some lingering right-sided lung infiltrate.  There is severe hyperexpansion of both lungs with some interstitial appearance bilaterally.  No obvious cardiomegaly.  Impression, abnormal chest x-ray with persistent but apparently slightly improved infiltrate.  Ongoing severe hyperexpansion.  The patient had a progressive decline and it was realized on her arterial blood glass that her carbon dioxide level was on measurably high, she was acidotic, this appeared to be a respiratory acidosis partially explaining the patient's tachypnea.  She was intubated as she would need mechanical ventilation to help reduce the CO2 and help fix the acidosis.  She is being sedated with propofol, she was intubated successfully on the first attempt.  We will discussed with intensive care unit for placement.  Labs pending at this time.   Leukocytosis present, repeat chest x-ray after intubation shows good tube position  Discussed with Dr. Rolla Etienne with ICU who will coordinate admission  Repeat BP is low with propofol - will give Ketamine, continuous nebs, solumedrol - critically ill and will need to go to ICU  Final Clinical Impressions(s) / ED Diagnoses   Final diagnoses:  Acute respiratory failure with hypoxia and hypercapnia (HCC)      Noemi Chapel, MD 09/27/18 [phone]

## 2018-09-27 NOTE — ED Notes (Signed)
Pt's husband can be contacted at [phone].

## 2018-09-27 NOTE — H&P (Signed)
NAME:  Evelyn Hughes, MRN:  [id number], DOB:  Mar 28, 1956, LOS: 0 ADMISSION DATE:  09/27/2018, CONSULTATION DATE:  09/27/2018 REFERRING MD: Noemi Chapel , CHIEF COMPLAINT:  Acute on chronic hypercarbic respiratory Failure   Brief History   The patient is an ill-appearing 63 year old female, current every day smoker recently admitted to the hospital ( 5/4-5/8)  and ultimately diagnosed with a combination of what was likely cor pulmonale and acute hypoxic respiratory distress and failure requiring intubation.  She self extubated and was ultimately placed on nasal cannula and  discharged home with oxygen.  Per the husband's report and per EMS report the patient has had a gradual decline especially over the last 12 to 24 hours refusing to eat or drink with AMS. Husband called 21,   Paramedics found the patient to be hypoxic,  Increased her  oxygen and transported her to the ED. She was intubated within 30 minutes of arrival. PCCM have been asked to admit to ICU  and manage  care.  History of present illness   HPI obtained from medical chart review as patient is intubated and sedated on mechanical ventilation The patient is an ill-appearing 63 year old female, current every day smoker recently admitted to the hospital ( 5/4-5/8)  and ultimately diagnosed with a combination of what was likely cor pulmonale and acute hypoxic respiratory distress and failure requiring intubation.  She self extubated and was ultimately placed on nasal cannula and  discharged home with oxygen.  Per the husband's report and per EMS report the patient has had a gradual decline especially over the last 12 to 24 hours refusing to eat or drink with AMS. Husband called 36,   Paramedics found the patient to be hypoxic,  Increased her  oxygen and transported her to the ED. ABG on arrival was 7.37/ 76.7/ 76.3/ 45.3 She was intubated within 30 minutes of arrival. Repeat ABG 7.52/ 55.4/ 417/ 45.3. Rate and FiO2 were decreased. Lactate in  the Ed was 2.0. Patient was given 1 L IVF and propofol was initiated ( patient has self extubated last admission). BNP is > 18,000, EKG showed Sinus tachycardia Right atrial enlargement Right axis deviation   Abnormal R-wave progression, late transition Abnormal lateral Q waves Borderline ST elevation, anterior leads T wave inversions in the anterior precordial leads no longer present , Troponin < 0,03 WBC 14.7, Temp 99.3, She was started on Cefepime in the ED.NA 130, Creatinine 0.50. CXR was positive for hyperinflation, emphysema,small bilateral pleural effusions,Bibasilar atelectasis vs infiltrate, improved right lung aeration. Pt. Has a soft BP so will hold on diuresis for today, consider tomorrow 5/11.Marland Kitchen PCCM will admit to ICU and manage care.  Past Medical History   Acute on chronic respiratory failure with hypoxia and hypercapnia (HCC)   Acute respiratory failure (HCC) Suspected COPD   Pleural effusion on right   Other secondary pulmonary hypertension (HCC)   Acute renal failure ([place])   CHF   Tobacco Abuse Significant Hospital Events   Admission 5/4-5/8 with intubation for respiratory failure Re-admission 5/10 for Acute on chronic Respiratory Failure  Consults:    Procedures:    Significant Diagnostic Tests:  09/27/2018 CXR Persistent small bilateral pleural effusions and bibasilar atelectasis. Improved right basilar lung aeration.  5/4 CT abd/ pelvis >> 1. No acute intra-abdominal process. 2. Small ascites and mild anasarca.  5/4 CT chest >> 1. Moderate right and small left pleural effusions. No pneumonia. 2. Mild right heart enlargement with dilated main pulmonary artery, suggestive of pulmonary  arterial hypertension. 3. Emphysema  4. Aortic atherosclerosis   5/4 CTH >> Minimal frontal and parietal lobe atrophy. Otherwise negative exam.    09/22/2018 Echo The left ventricle has normal systolic function, with an ejection fraction of 55-60%. The cavity size was  normal. Left ventricular diastolic function could not be evaluated.  The right ventricle has normal systolc function. The cavity was mildly enlarged. Right ventricular systolic pressure is mildly elevated with an estimated pressure of 46.6 mmHg.  Right atrial size was mildly dilated.  The mitral valve is grossly normal.  The tricuspid valve was grossly normal.  The aortic valve is tricuspid No stenosis of the aortic valve.  Limited study; normal LV function; mild RAE and RVE; moderate RV dysfunction; trace TR; mild pulmonary hypertension.  5/5 Doppler Studies Right: There is no evidence of deep vein thrombosis in the lower extremity. No cystic structure found in the popliteal fossa. Left: There is no evidence of deep vein thrombosis in the lower extremity. No cystic structure found in the popliteal fossa. Micro Data:  5/10 Blood Cultures x 2 5/10 Tracheal Aspirate 5/10 Urine Legionella 5/10 Urine Strep 5/10 Covid Negative  Antimicrobials:  Cefepime 5/10>> Vanc 5/10 >>   Interim history/subjective:  As above On propofol gtt in ED Will switch to Fentanyl  Objective   Blood pressure 112/69, pulse 94, temperature 98.9 F (37.2 C), resp. rate (!) 22, height 5\' 8"  (1.727 m), weight 48 kg, SpO2 99 %.    Vent Mode: PRVC FiO2 (%):  [50 %-100 %] 50 % Set Rate:  [22 bmp-26 bmp] 22 bmp Vt Set:  [500 mL] 500 mL PEEP:  [5 cmH20] 5 cmH20 Plateau Pressure:  [17 cmH20-23 cmH20] 23 cmH20   Intake/Output Summary (Last 24 hours) at 09/27/2018 1002 Last data filed at 09/27/2018 0949 Gross per 24 hour  Intake 300 ml  Output -  Net 300 ml   Filed Weights   09/27/18 0648  Weight: 48 kg    Examination: General:Frail female, sedated and intubated , arouses to stimulation HENT: NCAT, MAE x 4, No LAD, + JVD, NO LAD Lungs: Bilateral chest excursion, Coarse throughout, rhonchi, few wheezes noted Cardiovascular: S1, S2, RRR, NO RMG Abdomen: Soft, flat, NT, ND, BS diminished, no obvious  organomegally Extremities: Warm, dry, no mottling, normal bulk and tone, brisk refill Neuro: Sedated and intubated, MAE x 4 with stimulation, does not follow commands  GU: Foley cath with concentrated uring  [facility] Problem list     Assessment & Plan:  Acute hypoxic/ hypercarbic respiratory failure- possibly multifactorial, evidence of acute volume overload, emphysematous changes,  Persistent small bilateral pleural effusions and bibasilar atelectasis. Improved right basilar lung aeration. Tobacco abuse Suspected COPD P:  Full MV support, PRVC 8 cc/kg, rate 16 CXR in am and prn VAP bundle duonebs q 6 and prn  Solumedrol 80 BID Daily SBT trials start 5/11 Trend Mag Gentle diuresis once BP can tolerate Will need further outpatient pulmonary f/u /testing and tobacco cessation counseling  Suspected acute decompensated HF given  elevated BNP Recent Echo 5/5 P:  Tele monitoring Trend troponin Trend BNP Lasix once BP allows EKG prn Trend Lactate  Leukocytosis  Infiltrates vs atelectasis per CXR P:  Check PCT  Vanc And Cefepime per pharmacy Trend WBC/ fever curve Follow BC, Tracheal aspirate, Urine Re-culture as is clinically indicated  Hyponatremia- hypervolemic  P:  Lasix once BP stable Continue foley  Trend BMP / mag/ phos/  urinary output Replace electrolytes as indicated  Acute encephalopathy - Most likely 2/2 hypercarbia / hypoxemia P: Trend neuro exams, currently non-focal PAD protocol with fentanyl RASS goal 0/-1  Best practice:  Diet: NPO Pain/Anxiety/Delirium protocol (if indicated): Ordered VAP protocol (if indicated): Ordered DVT prophylaxis: Heparin SQ Q 8 GI prophylaxis: Pepcid Glucose control: SSI/ CBG Mobility: BR Code Status: Full Family Communication: Husband updated 5/10 Disposition: ICU  Labs   CBC: Recent Labs  Lab 09/21/18 1243  09/22/18 0600 09/24/18 0412 09/25/18 0313 09/27/18 0718 09/27/18 0831  WBC 20.1*  --   24.1* 18.5* 16.6* 14.7*  --   NEUTROABS 17.1*  --   --   --   --  12.1*  --   HGB 15.9*   < > 15.0 13.2 12.9 14.2 17.7*  HCT 53.0*   < > 45.7 42.6 42.6 50.3* 52.0*  MCV 90.0  --  83.7 87.3 89.7 94.0  --   PLT 343  --  253 173 151 216  --    < > = values in this interval not displayed.    Basic Metabolic Panel: Recent Labs  Lab 09/21/18 2310 09/22/18 0600 09/23/18 0445 09/24/18 0412 09/25/18 0313 09/27/18 0718 09/27/18 0831  NA 131* 131* 135 133* 134* 136 130*  K 5.0 4.8 3.1* 3.1* 4.3 4.9 4.6  CL 89* 88* 88* 81* 80* 82*  --   CO2 28 22 35* 43* 43* 41*  --   GLUCOSE 86 74 73 96 101* 106*  --   BUN 54* 54* 27* 17 11 19   --   CREATININE 1.54* 1.36* 0.83 0.54 0.54 0.50  --   CALCIUM 7.7* 7.8* 7.4* 7.4* 7.8* 8.7*  --   MG 1.7  --   --  1.5* 2.2  --   --   PHOS 5.9*  --   --  1.9* 2.0*  --   --    GFR: Estimated Creatinine Clearance: 55.3 mL/min (by C-G formula based on SCr of 0.5 mg/dL). Recent Labs  Lab 09/21/18 1516 09/21/18 1852 09/21/18 2310 09/22/18 0600 09/24/18 0412 09/25/18 0313 09/27/18 0718 09/27/18 0903  PROCALCITON  --   --  0.32  --   --   --   --   --   WBC  --   --   --  24.1* 18.5* 16.6* 14.7*  --   LATICACIDVEN 2.1* 1.6  --   --   --   --  0.7 2.0*    Liver Function Tests: Recent Labs  Lab 09/22/18 0600 09/23/18 0445 09/24/18 0412 09/25/18 0313 09/27/18 0718  AST 676* 255* 193* 153* 68*  ALT 683* 480* 435* 386* 263*  ALKPHOS 68 56 53 54 68  BILITOT 1.8* 1.7* 1.5* 1.1 1.1  PROT 4.4* 4.0* 4.2* 4.4* 5.2*  ALBUMIN 2.8* 2.4* 2.5* 2.6* 2.9*   No results for input(s): LIPASE, AMYLASE in the last 168 hours. No results for input(s): AMMONIA in the last 168 hours.  ABG    Component Value Date/Time   PHART 7.520 (H) 09/27/2018 0831   PCO2ART 55.4 (H) 09/27/2018 0831   PO2ART 417.0 (H) 09/27/2018 0831   HCO3 45.3 (H) 09/27/2018 0831   TCO2 47 (H) 09/27/2018 0831   ACIDBASEDEF 2.0 09/21/2018 1251   O2SAT 100.0 09/27/2018 0831     Coagulation  Profile: Recent Labs  Lab 09/21/18 2319 09/24/18 0412  INR 2.0* 1.2    Cardiac Enzymes: Recent Labs  Lab 09/21/18 1244 09/21/18 2310 09/22/18 0600 09/27/18 0718  TROPONINI 0.06* 0.09* 0.08* <0.03  HbA1C: No results found for: HGBA1C  CBG: Recent Labs  Lab 09/21/18 2323 09/22/18 0350 09/22/18 0738 09/22/18 1128 09/22/18 1535  GLUCAP 82 81 74 81 86    Review of Systems:   Unable 2/2 intubated and sedated  Past Medical History  She,  has a past medical history of Allergy.   Surgical History   History reviewed. No pertinent surgical history.   Social History   reports that she has been smoking. She has never used smokeless tobacco. She reports that she does not drink alcohol or use drugs.   Family History   Her family history includes Cancer in her mother.   Allergies Allergies  Allergen Reactions  . Sulfa Antibiotics   . Penicillins Rash     Home Medications  Prior to Admission medications   Medication Sig Start Date End Date Taking? Authorizing Provider  albuterol (VENTOLIN HFA) 108 (90 Base) MCG/ACT inhaler Inhale 2 puffs into the lungs every 6 (six) hours as needed for wheezing or shortness of breath. 09/25/18   Pokhrel, Corrie Mckusick, MD  azithromycin (ZITHROMAX Z-PAK) 250 MG tablet Take 2 tablets (500 mg) on  Day 1,  followed by 1 tablet (250 mg) once daily on Days 2 through 5. 09/25/18 09/30/18  Pokhrel, Corrie Mckusick, MD  benzonatate (TESSALON) 100 MG capsule Take 1-2 capsules (100-200 mg total) by mouth 3 (three) times daily as needed. 03/23/14   Posey Boyer, MD  Fluticasone-Salmeterol (ADVAIR DISKUS) 100-50 MCG/DOSE AEPB Inhale 1 puff into the lungs 2 (two) times daily. 09/25/18 09/25/19  Pokhrel, Corrie Mckusick, MD  furosemide (LASIX) 20 MG tablet Take 1 tablet (20 mg total) by mouth every other day. 09/25/18 11/24/18  Pokhrel, Corrie Mckusick, MD  magnesium oxide (MAG-OX) 400 (241.3 Mg) MG tablet Take 1 tablet (400 mg total) by mouth 2 (two) times daily for 30 days. 09/25/18 10/25/18   Pokhrel, Corrie Mckusick, MD  nicotine (NICODERM CQ - DOSED IN MG/24 HOURS) 14 mg/24hr patch Place 1 patch (14 mg total) onto the skin daily. 09/25/18 09/25/19  Pokhrel, Corrie Mckusick, MD  potassium chloride SA (K-DUR) 20 MEQ tablet Take 1 tablet (20 mEq total) by mouth daily for 10 days. 09/25/18 10/05/18  Pokhrel, Corrie Mckusick, MD  tiotropium (SPIRIVA HANDIHALER) 18 MCG inhalation capsule Place 1 capsule (18 mcg total) into inhaler and inhale daily for 30 days. 09/25/18 10/25/18  Flora Lipps, MD     Critical care time: 60 minutes   Magdalen Spatz, AGACNP-BC [place] Pager # [phone] After 4 pm Pager [phone] 09/27/2018 10:03 AM

## 2018-09-27 NOTE — ED Notes (Signed)
Antibiotics started after blood cultures drawn.  

## 2018-09-27 NOTE — ED Notes (Addendum)
ED TO INPATIENT HANDOFF REPORT  ED Nurse Name and Phone #: Thurmond Butts, [phone]  S Name/Age/Gender Evelyn Hughes 63 y.o. female Room/Bed: 018C/018C  Code Status   Code Status: Full Code  Home/SNF/Other Home Patient oriented to: self Is this baseline? Yes   Triage Complete: Triage complete  Chief Complaint ams  Triage Note Pt bib [facility] from home d/t AMS.  Per EMS pt's spouse reported pt released from hospital on 5/8 d/t "Fluid on her lung."   Pt was intubated during last hospitalization.  Per pt's spouse pt has been weak and lethargic since d/c on 5/8.  Spouse reports that pt became increasingly confused prompting him to call.  Pt is responsive to pain.  Breathing is labored and shallow.      Allergies Allergies  Allergen Reactions  . Sulfa Antibiotics   . Penicillins Rash    Level of Care/Admitting Diagnosis ED Disposition    ED Disposition Condition [facility]: [place] [[id number]]  Level of Care: ICU [6]  Covid Evaluation: N/A  Diagnosis: Respiratory failure with hypoxia and hypercapnia [facility]) [[id number]]  Admitting Physician: Marshell Garfinkel [[id number]]  Attending Physician: Marshell Garfinkel [[id number]]  Estimated length of stay: 5 - 7 days  Certification:: I certify this patient will need inpatient services for at least 2 midnights  PT Class (Do Not Modify): Inpatient [101]  PT Acc Code (Do Not Modify): Private [1]       B Medical/Surgery History Past Medical History:  Diagnosis Date  . Allergy    History reviewed. No pertinent surgical history.   A IV Location/Drains/Wounds Patient Lines/Drains/Airways Status   Active Line/Drains/Airways    Name:   Placement date:   Placement time:   Site:   Days:   Peripheral IV 09/27/18 Left Forearm   09/27/18    0636    Forearm   less than 1   Peripheral IV 09/27/18 Right Wrist   09/27/18    0700    Wrist   less than 1   NG/OG Tube Orogastric 16 Fr. Center mouth Xray   09/27/18    0744     Center mouth   less than 1   Urethral Catheter Maggie, RN Triple-lumen 14 Fr.   09/27/18    0809    Triple-lumen   less than 1   Airway 7.5 mm   09/27/18    0745     less than 1   Pressure Injury 09/22/18 Stage I -  Intact skin with non-blanchable redness of a localized area usually over a bony prominence.   09/22/18    1719     5          Intake/Output Last 24 hours  Intake/Output Summary (Last 24 hours) at 09/27/2018 1021 Last data filed at 09/27/2018 2637 Gross per 24 hour  Intake 300 ml  Output -  Net 300 ml    Labs/Imaging Results for orders placed or performed during the hospital encounter of 09/27/18 (from the past 48 hour(s))  SARS Coronavirus 2 (CEPHEID - Performed in [place] hospital lab), Hosp Order     Status: None   Collection Time: 09/27/18  7:00 AM  Result Value Ref Range   SARS Coronavirus 2 NEGATIVE NEGATIVE    Comment: (NOTE) If result is NEGATIVE SARS-CoV-2 target nucleic acids are NOT DETECTED. The SARS-CoV-2 RNA is generally detectable in upper and lower  respiratory specimens during the acute phase of infection. The lowest  concentration  of SARS-CoV-2 viral copies this assay can detect is 250  copies / mL. A negative result does not preclude SARS-CoV-2 infection  and should not be used as the sole basis for treatment or other  patient management decisions.  A negative result may occur with  improper specimen collection / handling, submission of specimen other  than nasopharyngeal swab, presence of viral mutation(s) within the  areas targeted by this assay, and inadequate number of viral copies  (<250 copies / mL). A negative result must be combined with clinical  observations, patient history, and epidemiological information. If result is POSITIVE SARS-CoV-2 target nucleic acids are DETECTED. The SARS-CoV-2 RNA is generally detectable in upper and lower  respiratory specimens dur ing the acute phase of infection.  Positive  results are indicative of  active infection with SARS-CoV-2.  Clinical  correlation with patient history and other diagnostic information is  necessary to determine patient infection status.  Positive results do  not rule out bacterial infection or co-infection with other viruses. If result is PRESUMPTIVE POSTIVE SARS-CoV-2 nucleic acids MAY BE PRESENT.   A presumptive positive result was obtained on the submitted specimen  and confirmed on repeat testing.  While 2019 novel coronavirus  (SARS-CoV-2) nucleic acids may be present in the submitted sample  additional confirmatory testing may be necessary for epidemiological  and / or clinical management purposes  to differentiate between  SARS-CoV-2 and other Sarbecovirus currently known to infect humans.  If clinically indicated additional testing with an alternate test  methodology [phone]) is advised. The SARS-CoV-2 RNA is generally  detectable in upper and lower respiratory sp ecimens during the acute  phase of infection. The expected result is Negative. Fact Sheet for Patients:  StrictlyIdeas.no Fact Sheet for Healthcare Providers: BankingDealers.co.za This test is not yet approved or cleared by the [place] FDA and has been authorized for detection and/or diagnosis of SARS-CoV-2 by FDA under an Emergency Use Authorization (EUA).  This EUA will remain in effect (meaning this test can be used) for the duration of the COVID-19 declaration under Section 564(b)(1) of the Act, 21 U.S.C. section 360bbb-3(b)(1), unless the authorization is terminated or revoked sooner. Performed at [facility], [place] [address], [place], [place] [address]   CBC with Differential/Platelet     Status: Abnormal   Collection Time: 09/27/18  7:18 AM  Result Value Ref Range   WBC 14.7 (H) 4.0 - 10.5 K/uL   RBC 5.35 (H) 3.87 - 5.11 MIL/uL   Hemoglobin 14.2 12.0 - 15.0 g/dL   HCT 50.3 (H) 36.0 - 46.0 %   MCV 94.0 80.0 - 100.0 fL    MCH 26.5 26.0 - 34.0 pg   MCHC 28.2 (L) 30.0 - 36.0 g/dL   RDW 14.1 11.5 - 15.5 %   Platelets 216 150 - 400 K/uL   nRBC 0.0 0.0 - 0.2 %   Neutrophils Relative % 82 %   Neutro Abs 12.1 (H) 1.7 - 7.7 K/uL   Lymphocytes Relative 5 %   Lymphs Abs 0.7 0.7 - 4.0 K/uL   Monocytes Relative 12 %   Monocytes Absolute 1.7 (H) 0.1 - 1.0 K/uL   Eosinophils Relative 0 %   Eosinophils Absolute 0.0 0.0 - 0.5 K/uL   Basophils Relative 0 %   Basophils Absolute 0.0 0.0 - 0.1 K/uL   Immature Granulocytes 1 %   Abs Immature Granulocytes 0.08 (H) 0.00 - 0.07 K/uL    Comment: Performed at [facility], [address] [place], [place] [address]  Comprehensive metabolic panel     Status: Abnormal   Collection Time: 09/27/18  7:18 AM  Result Value Ref Range   Sodium 136 135 - 145 mmol/L   Potassium 4.9 3.5 - 5.1 mmol/L   Chloride 82 (L) 98 - 111 mmol/L   CO2 41 (H) 22 - 32 mmol/L   Glucose, Bld 106 (H) 70 - 99 mg/dL   BUN 19 8 - 23 mg/dL   Creatinine, Ser 0.50 0.44 - 1.00 mg/dL   Calcium 8.7 (L) 8.9 - 10.3 mg/dL   Total Protein 5.2 (L) 6.5 - 8.1 g/dL   Albumin 2.9 (L) 3.5 - 5.0 g/dL   AST 68 (H) 15 - 41 U/L   ALT 263 (H) 0 - 44 U/L   Alkaline Phosphatase 68 38 - 126 U/L   Total Bilirubin 1.1 0.3 - 1.2 mg/dL   GFR calc non Af Amer >60 >60 mL/min   GFR calc Af Amer >60 >60 mL/min   Anion gap 13 5 - 15    Comment: Performed at [facility], [place] [address], [place], [place] [address]  Troponin I - ONCE - STAT     Status: None   Collection Time: 09/27/18  7:18 AM  Result Value Ref Range   Troponin I <0.03 <0.03 ng/mL    Comment: Performed at [facility] [address], [place], [place] [address]  Lactic acid, plasma     Status: None   Collection Time: 09/27/18  7:18 AM  Result Value Ref Range   Lactic Acid, Venous 0.7 0.5 - 1.9 mmol/L    Comment: Performed at [facility] [address], [place], [place] [address]  Brain natriuretic peptide     Status: Abnormal    Collection Time: 09/27/18  7:18 AM  Result Value Ref Range   B Natriuretic Peptide 1,852.5 (H) 0.0 - 100.0 pg/mL    Comment: Performed at [facility] [address], [place], [place] [address]  Urinalysis, Routine w reflex microscopic     Status: Abnormal   Collection Time: 09/27/18  8:20 AM  Result Value Ref Range   Color, Urine AMBER (A) YELLOW    Comment: BIOCHEMICALS MAY BE AFFECTED BY COLOR   APPearance CLEAR CLEAR   Specific Gravity, Urine 1.019 1.005 - 1.030   pH 6.0 5.0 - 8.0   Glucose, UA NEGATIVE NEGATIVE mg/dL   Hgb urine dipstick NEGATIVE NEGATIVE   Bilirubin Urine NEGATIVE NEGATIVE   Ketones, ur 5 (A) NEGATIVE mg/dL   Protein, ur 30 (A) NEGATIVE mg/dL   Nitrite NEGATIVE NEGATIVE   Leukocytes,Ua NEGATIVE NEGATIVE   RBC / HPF 0-5 0 - 5 RBC/hpf   WBC, UA 6-10 0 - 5 WBC/hpf   Bacteria, UA NONE SEEN NONE SEEN   Squamous Epithelial / LPF 0-5 0 - 5   Mucus PRESENT     Comment: Performed at [facility], [place] [address], [place], [place] [address]  I-STAT 7, (LYTES, BLD GAS, ICA, H+H)     Status: Abnormal   Collection Time: 09/27/18  8:31 AM  Result Value Ref Range   pH, Arterial 7.520 (H) 7.350 - 7.450   pCO2 arterial 55.4 (H) 32.0 - 48.0 mmHg   pO2, Arterial 417.0 (H) 83.0 - 108.0 mmHg   Bicarbonate 45.3 (H) 20.0 - 28.0 mmol/L   TCO2 47 (H) 22 - 32 mmol/L   O2 Saturation 100.0 %  Acid-Base Excess 18.0 (H) 0.0 - 2.0 mmol/L   Sodium 130 (L) 135 - 145 mmol/L   Potassium 4.6 3.5 - 5.1 mmol/L   Calcium, Ion 1.11 (L) 1.15 - 1.40 mmol/L   HCT 52.0 (H) 36.0 - 46.0 %   Hemoglobin 17.7 (H) 12.0 - 15.0 g/dL   Patient temperature 98.5 F    Collection site RADIAL, ALLEN'S TEST ACCEPTABLE    Drawn by Operator    Sample type ARTERIAL   Lactic acid, plasma     Status: Abnormal   Collection Time: 09/27/18  9:03 AM  Result Value Ref Range   Lactic Acid, Venous 2.0 (HH) 0.5 - 1.9 mmol/L    Comment: CRITICAL RESULT CALLED TO, READ BACK BY AND VERIFIED WITH: RON Thurma Priego  RN AT [phone] 09/27/2018 BY [facility] Performed at [facility], [address] [place], [place] [address]    Dg Chest Portable 1 View  Result Date: 09/27/2018 CLINICAL DATA:  Status post intubation. EXAM: PORTABLE CHEST 1 VIEW COMPARISON:  09/27/2018 at 0707 hours FINDINGS: An endotracheal tube has been placed and appears well position between the clavicles and carina. An enteric tube has been placed and courses into the abdomen with tip not imaged. The cardiomediastinal silhouette is unchanged. A small right pleural effusion and mild right basilar airspace opacity are unchanged. Known small left pleural effusion is not well visualized as the left costophrenic angle was excluded. No pneumothorax is identified. IMPRESSION: Interval intubation.  Otherwise unchanged examination. Electronically Signed   By: Logan Bores M.D.   On: 09/27/2018 08:12   Dg Chest Portable 1 View  Result Date: 09/27/2018 CLINICAL DATA:  Respiratory distress. EXAM: PORTABLE CHEST 1 VIEW COMPARISON:  09/23/2018 FINDINGS: The cardiomediastinal silhouette is within normal limits for portable AP technique. The lungs are hyperinflated with chronic interstitial coarsening in the setting of known emphysema. Small pleural effusions persist, right slightly larger than left. Mild bibasilar lung opacities remain, however there is improved aeration of the right lung base with the denser consolidative opacity on the prior study having resolved. Mild patchy opacity in the right mid lung has slightly increased. No pneumothorax is identified. No acute osseous abnormality is seen. IMPRESSION: 1. Persistent small bilateral pleural effusions and bibasilar atelectasis. 2. Improved right basilar lung aeration. Electronically Signed   By: Logan Bores M.D.   On: 09/27/2018 07:25    Pending Labs Unresulted Labs (From admission, onward)    Start     Ordered   09/28/18 0500  CBC  Tomorrow morning,   R     09/27/18 1001   09/28/18 4650  Basic  metabolic panel  Tomorrow morning,   R     09/27/18 1001   09/28/18 0500  Blood gas, arterial  Tomorrow morning,   R    Comments:  Vent    09/27/18 1001   09/28/18 0500  Magnesium  Tomorrow morning,   R     09/27/18 1001   09/28/18 0500  Phosphorus  Tomorrow morning,   R     09/27/18 1001   09/28/18 0500  Procalcitonin  Daily,   R     09/27/18 1002   09/28/18 0500  Brain natriuretic peptide  Once,   R     09/27/18 1002   09/28/18 0500  Lactic acid, plasma  Tomorrow morning,   R     09/27/18 1002   09/27/18 1111  Blood gas, arterial  Once,   R     09/27/18 1011  09/27/18 0954  Culture, respiratory (tracheal aspirate)  Once,   R    Question:  Patient immune status  Answer:  Normal   09/27/18 1001   09/27/18 0951  Protime-INR  Once,   R     09/27/18 1001   09/27/18 0951  Comprehensive metabolic panel  Once,   R     09/27/18 1001   09/27/18 0951  Strep pneumoniae urinary antigen  (not at [facility])  Once,   R     09/27/18 1001   09/27/18 0951  Legionella Pneumophila Serogp 1 Ur Ag  Once,   R     09/27/18 1001   09/27/18 0950  Procalcitonin  Once,   R     09/27/18 1001   09/27/18 0950  Magnesium  Once,   R     09/27/18 1001   09/27/18 0950  Phosphorus  Once,   R     09/27/18 1001   09/27/18 0947  CBC  (heparin)  Once,   R    Comments:  Baseline for heparin therapy IF NOT ALREADY DRAWN.  Notify MD if PLT < 100 K.    09/27/18 1001   09/27/18 0703  Blood Culture (routine x 2)  BLOOD CULTURE X 2,   STAT     09/27/18 0704   09/27/18 0702  Blood gas, arterial  Once,   STAT    Question:  Room air or oxygen  Answer:  Room air   09/27/18 0704          Vitals/Pain Today's Vitals   09/27/18 0900 09/27/18 0905 09/27/18 0930 09/27/18 0945  BP: 95/63 110/73 112/69 (!) 96/51  Pulse: 85 81 94 (!) 103  Resp: (!) 22 (!) 22 (!) 22 (!) 22  Temp: 99.2 F (37.3 C) 99.1 F (37.3 C) 98.9 F (37.2 C) 99.3 F (37.4 C)  TempSrc:      SpO2: 100% 100% 99% 100%  Weight:      Height:         Isolation Precautions No active isolations  Medications Medications  ceFEPIme (MAXIPIME) 2 g in sodium chloride 0.9 % 100 mL IVPB (0 g Intravenous Stopped 09/27/18 0818)  propofol (DIPRIVAN) 1000 MG/100ML infusion (has no administration in time range)  vancomycin (VANCOCIN) IVPB 750 mg/150 ml premix (has no administration in time range)  albuterol (PROVENTIL,VENTOLIN) solution continuous neb (10 mg/hr Nebulization New Bag/Given 09/27/18 0853)  heparin injection 5,000 Units (has no administration in time range)  famotidine (PEPCID) 40 MG/5ML suspension 20 mg (has no administration in time range)  0.9 %  sodium chloride infusion (has no administration in time range)  acetaminophen (TYLENOL) tablet 650 mg (has no administration in time range)  ondansetron (ZOFRAN) injection 4 mg (has no administration in time range)  ipratropium-albuterol (DUONEB) 0.5-2.5 (3) MG/3ML nebulizer solution 3 mL (3 mLs Nebulization Not Given 09/27/18 1014)  albuterol (PROVENTIL) (2.5 MG/3ML) 0.083% nebulizer solution 2.5 mg (has no administration in time range)  methylPREDNISolone sodium succinate (SOLU-MEDROL) 125 mg/2 mL injection 80 mg (has no administration in time range)  fentaNYL (SUBLIMAZE) injection 50 mcg (has no administration in time range)  fentaNYL 2587mcg in NS 231mL (70mcg/ml) infusion-PREMIX (has no administration in time range)  fentaNYL (SUBLIMAZE) bolus via infusion 50 mcg (has no administration in time range)  vancomycin (VANCOCIN) IVPB 1000 mg/200 mL premix (0 mg Intravenous Stopped 09/27/18 0949)  etomidate (AMIDATE) injection (10 mg Intravenous Given 09/27/18 0738)  rocuronium (ZEMURON) injection (50 mg Intravenous Given 09/27/18 0739)  methylPREDNISolone sodium succinate (SOLU-MEDROL) 125 mg/2 mL injection 125 mg (125 mg Intravenous Given 09/27/18 0839)  midazolam (VERSED) injection 2 mg (2 mg Intravenous Given 09/27/18 0839)  sodium chloride 0.9 % bolus 1,000 mL (1,000 mLs Intravenous New  Bag/Given 09/27/18 0847)  ketamine 50 mg in normal saline 5 mL (10 mg/mL) syringe (24 mg Intravenous Given 09/27/18 0908)    Mobility walks High fall risk   Focused Assessments     R Recommendations: See Admitting Provider Note  Report given to: Lorriane Shire, RN  Additional Notes:

## 2018-09-28 ENCOUNTER — Inpatient Hospital Stay (HOSPITAL_COMMUNITY): Payer: BC Managed Care – PPO

## 2018-09-28 DIAGNOSIS — L8990 Pressure ulcer of unspecified site, unspecified stage: Secondary | ICD-10-CM

## 2018-09-28 LAB — PHOSPHORUS: Phosphorus: 2.1 mg/dL — ABNORMAL LOW (ref 2.5–4.6)

## 2018-09-28 LAB — BASIC METABOLIC PANEL
Anion gap: 13 (ref 5–15)
BUN: 27 mg/dL — ABNORMAL HIGH (ref 8–23)
CO2: 35 mmol/L — ABNORMAL HIGH (ref 22–32)
Calcium: 9.1 mg/dL (ref 8.9–10.3)
Chloride: 88 mmol/L — ABNORMAL LOW (ref 98–111)
Creatinine, Ser: 0.71 mg/dL (ref 0.44–1.00)
GFR calc Af Amer: >60 mL/min (ref >60)
GFR calc non Af Amer: >60 mL/min (ref >60)
Glucose, Bld: 134 mg/dL — ABNORMAL HIGH (ref 70–99)
Potassium: 4.8 mmol/L (ref 3.5–5.1)
Sodium: 136 mmol/L (ref 135–145)

## 2018-09-28 LAB — POCT I-STAT EG7
Acid-Base Excess: 9 mmol/L — ABNORMAL HIGH (ref 0.0–2.0)
Bicarbonate: 36.6 mmol/L — ABNORMAL HIGH (ref 20.0–28.0)
Calcium, Ion: 1.23 mmol/L (ref 1.15–1.40)
HCT: 44 % (ref 36.0–46.0)
Hemoglobin: 15 g/dL (ref 12.0–15.0)
O2 Saturation: 91 %
Patient temperature: 37
Potassium: 4.5 mmol/L (ref 3.5–5.1)
Sodium: 134 mmol/L — ABNORMAL LOW (ref 135–145)
TCO2: 38 mmol/L — ABNORMAL HIGH (ref 22–32)
pCO2, Ven: 58.7 mmHg (ref 44.0–60.0)
pH, Ven: 7.403 (ref 7.250–7.430)
pO2, Ven: 64 mmHg — ABNORMAL HIGH (ref 32.0–45.0)

## 2018-09-28 LAB — BLOOD GAS, ARTERIAL
Acid-Base Excess: 14.2 mmol/L — ABNORMAL HIGH (ref 0.0–2.0)
Bicarbonate: 38.2 mmol/L — ABNORMAL HIGH (ref 20.0–28.0)
Drawn by: 34719
FIO2: 0.4
MECHVT: 450 mL
O2 Saturation: 97.1 %
PEEP: 5 cmH2O
Patient temperature: 99.7
RATE: 16 resp/min
pCO2 arterial: 46.1 mmHg (ref 32.0–48.0)
pH, Arterial: 7.531 — ABNORMAL HIGH (ref 7.350–7.450)
pO2, Arterial: 84.4 mmHg (ref 83.0–108.0)

## 2018-09-28 LAB — GLUCOSE, CAPILLARY
Glucose-Capillary: 106 mg/dL — ABNORMAL HIGH (ref 70–99)
Glucose-Capillary: 123 mg/dL — ABNORMAL HIGH (ref 70–99)
Glucose-Capillary: 131 mg/dL — ABNORMAL HIGH (ref 70–99)
Glucose-Capillary: 133 mg/dL — ABNORMAL HIGH (ref 70–99)
Glucose-Capillary: 133 mg/dL — ABNORMAL HIGH (ref 70–99)
Glucose-Capillary: 151 mg/dL — ABNORMAL HIGH (ref 70–99)

## 2018-09-28 LAB — TROPONIN I: Troponin I: <0.03 ng/mL (ref <0.03)

## 2018-09-28 LAB — CBC
HCT: 45.2 % (ref 36.0–46.0)
Hemoglobin: 14 g/dL (ref 12.0–15.0)
MCH: 26.8 pg (ref 26.0–34.0)
MCHC: 31 g/dL (ref 30.0–36.0)
MCV: 86.4 fL (ref 80.0–100.0)
Platelets: 246 10*3/uL (ref 150–400)
RBC: 5.23 MIL/uL — ABNORMAL HIGH (ref 3.87–5.11)
RDW: 14.6 % (ref 11.5–15.5)
WBC: 26.1 10*3/uL — ABNORMAL HIGH (ref 4.0–10.5)
nRBC: 0 % (ref 0.0–0.2)

## 2018-09-28 LAB — URINE CULTURE: Culture: NO GROWTH

## 2018-09-28 LAB — MAGNESIUM: Magnesium: 1.9 mg/dL (ref 1.7–2.4)

## 2018-09-28 LAB — LACTIC ACID, PLASMA: Lactic Acid, Venous: 2.2 mmol/L (ref 0.5–1.9)

## 2018-09-28 LAB — BRAIN NATRIURETIC PEPTIDE: B Natriuretic Peptide: 230.6 pg/mL — ABNORMAL HIGH (ref 0.0–100.0)

## 2018-09-28 LAB — PROCALCITONIN: Procalcitonin: 0.14 ng/mL

## 2018-09-28 MED ORDER — SODIUM CHLORIDE 0.9 % IV BOLUS
1000.0000 mL | Freq: Once | INTRAVENOUS | Status: AC
  Administered 2018-09-28: 06:00:00 1000 mL via INTRAVENOUS

## 2018-09-28 MED ORDER — CHLORHEXIDINE GLUCONATE CLOTH 2 % EX PADS
6.0000 | MEDICATED_PAD | Freq: Every day | CUTANEOUS | Status: DC
  Administered 2018-09-28 – 2018-10-01 (×4): 6 via TOPICAL

## 2018-09-28 MED ORDER — IPRATROPIUM-ALBUTEROL 0.5-2.5 (3) MG/3ML IN SOLN
3.0000 mL | Freq: Three times a day (TID) | RESPIRATORY_TRACT | Status: DC
  Administered 2018-09-28 – 2018-10-05 (×19): 3 mL via RESPIRATORY_TRACT
  Filled 2018-09-28 (×19): qty 3

## 2018-09-28 MED ORDER — SODIUM PHOSPHATES 45 MMOLE/15ML IV SOLN
10.0000 mmol | Freq: Once | INTRAVENOUS | Status: AC
  Administered 2018-09-28: 16:00:00 10 mmol via INTRAVENOUS
  Filled 2018-09-28: qty 3.33

## 2018-09-28 MED ORDER — FENTANYL CITRATE (PF) 100 MCG/2ML IJ SOLN: 50.0000 ug | INTRAMUSCULAR | Status: DC | PRN

## 2018-09-28 MED ORDER — VITAL HIGH PROTEIN PO LIQD
1000.0000 mL | ORAL | Status: AC
  Administered 2018-09-28: 1000 mL

## 2018-09-28 MED ORDER — PRO-STAT SUGAR FREE PO LIQD
30.0000 mL | Freq: Two times a day (BID) | ORAL | Status: DC
  Administered 2018-09-28: 30 mL
  Filled 2018-09-28: qty 30

## 2018-09-28 MED ORDER — FUROSEMIDE 10 MG/ML IJ SOLN
40.0000 mg | Freq: Once | INTRAMUSCULAR | Status: AC
  Administered 2018-09-28: 12:00:00 40 mg via INTRAVENOUS
  Filled 2018-09-28: qty 4

## 2018-09-28 MED ORDER — VITAL AF 1.2 CAL PO LIQD: 1000.0000 mL | ORAL | Status: DC

## 2018-09-28 NOTE — Progress Notes (Signed)
[facility] Progress Note Patient Name: Evelyn Hughes DOB: 15-May-1956 MRN: [id number]   Date of Service  09/28/2018  HPI/Events of Note  Oliguria - Urine looks very concentrated. Lactic Acid = 2.2.   eICU Interventions  Will bolus with 0.9 NaCl 1 liter IV over 1 hour now.      Intervention Category Intermediate Interventions: Oliguria - evaluation and management  Sommer,Steven Eugene 09/28/2018, 5:05 AM

## 2018-09-28 NOTE — Progress Notes (Signed)
NAME:  Evelyn Hughes, MRN:  [id number], DOB:  07-13-1955, LOS: 1 ADMISSION DATE:  09/27/2018, CONSULTATION DATE:  09/27/2018 REFERRING MD: Noemi Chapel , CHIEF COMPLAINT:  Acute on chronic hypercarbic respiratory Failure   Brief History   The patient is an ill-appearing 63 year old female, current every day smoker recently admitted to the hospital ( 5/4-5/8)  and ultimately diagnosed with a combination of what was likely cor pulmonale and acute hypoxic respiratory distress and failure requiring intubation.  She self extubated and was ultimately placed on nasal cannula and  discharged home with oxygen.  Per the husband's report and per EMS report the patient has had a gradual decline especially over the last 12 to 24 hours refusing to eat or drink with AMS. Husband called 50,   Paramedics found the patient to be hypoxic,  Increased her  oxygen and transported her to the ED. She was intubated within 30 minutes of arrival. PCCM have been asked to admit to ICU  and manage  care.  Past Medical History  Acute on chronic respiratory failure with hypoxia and hypercapnia (HCC)   Acute respiratory failure (HCC) Suspected COPD   Pleural effusion on right   Other secondary pulmonary hypertension (HCC)   Acute renal failure ([place])   CHF   Tobacco Abuse  Significant Hospital Events   Admission 5/4-5/8 with intubation for respiratory failure Re-admission 5/10 for Acute on chronic Respiratory Failure  Consults:    Procedures:    Significant Diagnostic Tests:   5/4 CT abd/ pelvis >> 1. No acute intra-abdominal process. 2. Small ascites and mild anasarca.  5/4 CT chest >> 1. Moderate right and small left pleural effusions. No pneumonia. 2. Mild right heart enlargement with dilated main pulmonary artery, suggestive of pulmonary arterial hypertension. 3. Emphysema  4. Aortic atherosclerosis   5/4 CTH >> Minimal frontal and parietal lobe atrophy. Otherwise negative exam.  09/22/2018 Echo  The left ventricle has normal systolic function, with an ejection fraction of 55-60%. The cavity size was normal. Left ventricular diastolic function could not be evaluated.  The right ventricle has normal systolc function. The cavity was mildly enlarged. Right ventricular systolic pressure is mildly elevated with an estimated pressure of 46.6 mmHg.  Right atrial size was mildly dilated.  The mitral valve is grossly normal.  The tricuspid valve was grossly normal.  The aortic valve is tricuspid No stenosis of the aortic valve.  Limited study; normal LV function; mild RAE and RVE; moderate RV dysfunction; trace TR; mild pulmonary hypertension.  5/5 Doppler Studies Right: There is no evidence of deep vein thrombosis in the lower extremity. No cystic structure found in the popliteal fossa. Left: There is no evidence of deep vein thrombosis in the lower extremity. No cystic structure found in the popliteal fossa. Micro Data:  5/10 Blood Cultures x 2 5/10 Tracheal Aspirate- GPCs 5/10 Urine Legionella 5/10 Urine Strep 5/10 Covid Negative  Antimicrobials:  Cefepime 5/10>> Vanc 5/10 >>   Interim history/subjective:  Remains on ventilator.  Weaning on pressure support 10/5 Awake, interactive and in no distress.  Denies any chest pain, dyspnea  Objective   Blood pressure 112/69, pulse 81, temperature 98.3 F (36.8 C), temperature source Oral, resp. rate 16, height 5\' 5"  (1.651 m), weight 50 kg, SpO2 100 %.    Vent Mode: CPAP;PSV FiO2 (%):  [40 %-45 %] 40 % Set Rate:  [16 bmp] 16 bmp Vt Set:  [450 mL-500 mL] 450 mL PEEP:  [5 cmH20] 5 cmH20 Pressure  Support:  [10 cmH20] Simpson Pressure:  [10 cmH20-17 cmH20] 15 cmH20   Intake/Output Summary (Last 24 hours) at 09/28/2018 0902 Last data filed at 09/28/2018 0600 Gross per 24 hour  Intake 1187.46 ml  Output 460 ml  Net 727.46 ml   Filed Weights   09/27/18 0648 09/28/18 0458  Weight: 48 kg 50 kg    Examination: Gen:      No  acute distress HEENT:  EOMI, sclera anicteric. ETT Neck:     No masses; no thyromegaly Lungs:    Clear to auscultation bilaterally; normal respiratory effort CV:         Regular rate and rhythm; no murmurs Abd:      + bowel sounds; soft, non-tender; no palpable masses, no distension Ext:    No edema; adequate peripheral perfusion Skin:      Warm and dry; no rash Neuro: alert and oriented x 3  Resolved Hospital Problem list   Hyponatremia, Acute encephalopathy  Assessment & Plan:  Acute hypoxic/ hypercarbic respiratory failure- possibly multifactorial, evidence of acute volume overload, emphysematous changes,  Persistent small bilateral pleural effusions and bibasilar atelectasis. Improved right basilar lung aeration. Tobacco abuse Suspected COPD P:  Continue pressure support weans We will reduce to PSV 5/5 and assess for extubation Continue Solu-Medrol, nebs Continue antibiotics.  Follow sputum cultures  Diastolic heart failure Recent Echo 5/5 P:  Keep I/O even Lasix once BP tolerates.  Leukocytosis  Infiltrates vs atelectasis per CXR P:  Antibiotics as above Follow procalcitonin, sputum cultures.  Best practice:  Diet: Tube feeds Pain/Anxiety/Delirium protocol (if indicated): Ordered VAP protocol (if indicated): Ordered DVT prophylaxis: Heparin SQ Q 8 GI prophylaxis: Pepcid Glucose control: SSI/ CBG Mobility: BR Code Status: Full Family Communication: Husband updated 5/10 Disposition: ICU  Labs   CBC: Recent Labs  Lab 09/21/18 1243  09/24/18 0412 09/25/18 0313 09/27/18 0718 09/27/18 0831 09/27/18 1036 09/27/18 1117 09/28/18 0412  WBC 20.1*   < > 18.5* 16.6* 14.7*  --  13.8*  --  26.1*  NEUTROABS 17.1*  --   --   --  12.1*  --   --   --   --   HGB 15.9*   < > 13.2 12.9 14.2 17.7* 13.6 16.3* 14.0  HCT 53.0*   < > 42.6 42.6 50.3* 52.0* 44.2 48.0* 45.2  MCV 90.0   < > 87.3 89.7 94.0  --  92.5  --  86.4  PLT 343   < > 173 151 216  --  207  --  246   < > =  values in this interval not displayed.    Basic Metabolic Panel: Recent Labs  Lab 09/21/18 2310  09/24/18 0412 09/25/18 0313 09/27/18 0718 09/27/18 0831 09/27/18 1036 09/27/18 1117 09/28/18 0412  NA 131*   < > 133* 134* 136 130* 130* 133* 136  K 5.0   < > 3.1* 4.3 4.9 4.6 5.3* 4.0 4.8  CL 89*   < > 81* 80* 82*  --  83*  --  88*  CO2 28   < > 43* 43* 41*  --  30  --  35*  GLUCOSE 86   < > 96 101* 106*  --  122*  --  134*  BUN 54*   < > 17 11 19   --  21  --  27*  CREATININE 1.54*   < > 0.54 0.54 0.50  --  0.47  --  0.71  CALCIUM 7.7*   < >  7.4* 7.8* 8.7*  --  8.3*  --  9.1  MG 1.7  --  1.5* 2.2  --   --  2.2  --  1.9  PHOS 5.9*  --  1.9* 2.0*  --   --  2.3*  --  2.1*   < > = values in this interval not displayed.   GFR: Estimated Creatinine Clearance: 57.6 mL/min (by C-G formula based on SCr of 0.71 mg/dL). Recent Labs  Lab 09/21/18 1852 09/21/18 2310  09/25/18 0313 09/27/18 0718 09/27/18 0903 09/27/18 1036 09/28/18 0412  PROCALCITON  --  0.32  --   --   --   --  0.17 0.14  WBC  --   --    < > 16.6* 14.7*  --  13.8* 26.1*  LATICACIDVEN 1.6  --   --   --  0.7 2.0*  --  2.2*   < > = values in this interval not displayed.    Liver Function Tests: Recent Labs  Lab 09/23/18 0445 09/24/18 0412 09/25/18 0313 09/27/18 0718 09/27/18 1036  AST 255* 193* 153* 68* 90*  ALT 480* 435* 386* 263* 234*  ALKPHOS 56 53 54 68 70  BILITOT 1.7* 1.5* 1.1 1.1 2.5*  PROT 4.0* 4.2* 4.4* 5.2* RESULTS UNAVAILABLE DUE TO INTERFERING SUBSTANCE  ALBUMIN 2.4* 2.5* 2.6* 2.9* 2.7*   No results for input(s): LIPASE, AMYLASE in the last 168 hours. No results for input(s): AMMONIA in the last 168 hours.  ABG    Component Value Date/Time   PHART 7.531 (H) 09/28/2018 0450   PCO2ART 46.1 09/28/2018 0450   PO2ART 84.4 09/28/2018 0450   HCO3 38.2 (H) 09/28/2018 0450   TCO2 39 (H) 09/27/2018 1117   ACIDBASEDEF 2.0 09/21/2018 1251   O2SAT 97.1 09/28/2018 0450     Coagulation Profile:  Recent Labs  Lab 09/21/18 2319 09/24/18 0412 09/27/18 1036  INR 2.0* 1.2 1.0    Cardiac Enzymes: Recent Labs  Lab 09/21/18 1244 09/21/18 2310 09/22/18 0600 09/27/18 0718 09/28/18 0412  TROPONINI 0.06* 0.09* 0.08* <0.03 <0.03    HbA1C: No results found for: HGBA1C  CBG: Recent Labs  Lab 09/27/18 1642 09/27/18 2010 09/28/18 0013 09/28/18 0439 09/28/18 0821  GLUCAP 161* 217* 133* 131* 123*   The patient is critically ill with multiple organ system failure and requires high complexity decision making for assessment and support, frequent evaluation and titration of therapies, advanced monitoring, review of radiographic studies and interpretation of complex data.   Critical Care Time devoted to patient care services, exclusive of separately billable procedures, described in this note is 35 minutes.   Marshell Garfinkel MD [facility] Pager [phone] If no answer call [phone] 09/28/2018, 9:11 AM

## 2018-09-28 NOTE — Progress Notes (Signed)
RT obtained ABG on pt with the following results. RT will continue to monitor.     Ref. Range 09/28/2018 04:50  Sample type Unknown ARTERIAL DRAW  Delivery systems Unknown VENTILATOR  FIO2 Unknown 0.40  Mode Unknown PRESSURE REGULATED VOLUME CONTROL  VT Latest Units: mL 450  Peep/cpap Latest Units: cm H20 5.0  pH, Arterial Latest Ref Range: 7.350 - 7.450  7.531 (H)  pCO2 arterial Latest Ref Range: 32.0 - 48.0 mmHg 46.1  pO2, Arterial Latest Ref Range: 83.0 - 108.0 mmHg 84.4  Acid-Base Excess Latest Ref Range: 0.0 - 2.0 mmol/L 14.2 (H)  Bicarbonate Latest Ref Range: 20.0 - 28.0 mmol/L 38.2 (H)  O2 Saturation Latest Units: % 97.1  Patient temperature Unknown 99.7  Collection site Unknown BRACHIAL ARTERY  Allens test (pass/fail) Latest Ref Range: PASS  PASS

## 2018-09-28 NOTE — Progress Notes (Signed)
Initial Nutrition Assessment  DOCUMENTATION CODES:   Underweight  INTERVENTION:   Tube Feeding:  Vital AF 1.2 at 45 ml/hr Provides 1296 kcals, 81 g of protein and 875 mL of free water Meets 100% protein needs, 99% calorie needs   NUTRITION DIAGNOSIS:   Inadequate oral intake related to acute illness as evidenced by NPO status. GOAL:   Patient will meet greater than or equal to 90% of their needs   MONITOR:   Vent status, Labs, Weight trends, TF tolerance  REASON FOR ASSESSMENT:   Consult, Ventilator Enteral/tube feeding initiation and management  ASSESSMENT:   63 yo female with recent admission from 5/04 to 5/08 with intubation for respiratory failure. Pt discharged home but required readmission on 5/10 with acute on chronic respiratory failure requiring intubation. PMH includes suspected COPD, CHF, tobacco abuse   Patient is currently intubated on ventilator support MV: 8 L/min Temp (24hrs), Avg:98.9 F (37.2 C), Min:98.3 F (36.8 C), Max:99.7 F (37.6 C)  OG tube in place   Admission weight 48 kg; current wt 50 kg. Noted pt net + 1 L per I/O flow sheet Height currently at 5 feet 5 inches tall; height on previous admission 5 feet 8 inches tall. RN to attempt to confirm pt's actual height  Labs: reviewed, phosphorus 2.1 (L) Meds: solumedrol, ss novolog  NUTRITION - FOCUSED PHYSICAL EXAM:  Unable to assess  Diet Order:   Diet Order            Diet NPO time specified  Diet effective now              EDUCATION NEEDS:   Not appropriate for education at this time  Skin:  Skin Assessment: Skin Integrity Issues: Stage I: sacrum  Last BM:  5/10  Height:   Ht Readings from Last 1 Encounters:  09/27/18 5\' 5"  (1.651 m)    Weight:   Wt Readings from Last 1 Encounters:  09/28/18 50 kg    BMI:  Body mass index is 18.34 kg/m.  Estimated Nutritional Needs:   Kcal:  1314 kcals   Protein:  72-96 g  Fluid:  >/= 1.5 L   Kerman Passey MS, RD,  LDN, CNSC [phone] Pager  [phone] Weekend/On-Call Pager

## 2018-09-29 ENCOUNTER — Inpatient Hospital Stay (HOSPITAL_COMMUNITY): Payer: BC Managed Care – PPO

## 2018-09-29 LAB — BLOOD GAS, ARTERIAL
Acid-Base Excess: 14.3 mmol/L — ABNORMAL HIGH (ref 0.0–2.0)
Bicarbonate: 38.9 mmol/L — ABNORMAL HIGH (ref 20.0–28.0)
Drawn by: 511911
FIO2: 40
MECHVT: 450 mL
O2 Saturation: 97.3 %
PEEP: 5 cmH2O
Patient temperature: 98.6
RATE: 16 resp/min
pCO2 arterial: 53.3 mmHg — ABNORMAL HIGH (ref 32.0–48.0)
pH, Arterial: 7.476 — ABNORMAL HIGH (ref 7.350–7.450)
pO2, Arterial: 87.5 mmHg (ref 83.0–108.0)

## 2018-09-29 LAB — GLUCOSE, CAPILLARY
Glucose-Capillary: 133 mg/dL — ABNORMAL HIGH (ref 70–99)
Glucose-Capillary: 119 mg/dL — ABNORMAL HIGH (ref 70–99)
Glucose-Capillary: 116 mg/dL — ABNORMAL HIGH (ref 70–99)
Glucose-Capillary: 108 mg/dL — ABNORMAL HIGH (ref 70–99)
Glucose-Capillary: 122 mg/dL — ABNORMAL HIGH (ref 70–99)

## 2018-09-29 LAB — CBC
HCT: 40.5 % (ref 36.0–46.0)
Hemoglobin: 12.8 g/dL (ref 12.0–15.0)
MCH: 27.1 pg (ref 26.0–34.0)
MCHC: 31.6 g/dL (ref 30.0–36.0)
MCV: 85.6 fL (ref 80.0–100.0)
Platelets: 257 10*3/uL (ref 150–400)
RBC: 4.73 MIL/uL (ref 3.87–5.11)
RDW: 14.8 % (ref 11.5–15.5)
WBC: 30.2 10*3/uL — ABNORMAL HIGH (ref 4.0–10.5)
nRBC: 0 % (ref 0.0–0.2)

## 2018-09-29 LAB — BASIC METABOLIC PANEL
Anion gap: 15 (ref 5–15)
BUN: 36 mg/dL — ABNORMAL HIGH (ref 8–23)
CO2: 32 mmol/L (ref 22–32)
Calcium: 8.5 mg/dL — ABNORMAL LOW (ref 8.9–10.3)
Chloride: 87 mmol/L — ABNORMAL LOW (ref 98–111)
Creatinine, Ser: 0.54 mg/dL (ref 0.44–1.00)
GFR calc Af Amer: >60 mL/min (ref >60)
GFR calc non Af Amer: >60 mL/min (ref >60)
Glucose, Bld: 133 mg/dL — ABNORMAL HIGH (ref 70–99)
Potassium: 4 mmol/L (ref 3.5–5.1)
Sodium: 134 mmol/L — ABNORMAL LOW (ref 135–145)

## 2018-09-29 LAB — LEGIONELLA PNEUMOPHILA SEROGP 1 UR AG: L. pneumophila Serogp 1 Ur Ag: NEGATIVE

## 2018-09-29 LAB — MAGNESIUM: Magnesium: 1.7 mg/dL (ref 1.7–2.4)

## 2018-09-29 LAB — PHOSPHORUS: Phosphorus: 3.5 mg/dL (ref 2.5–4.6)

## 2018-09-29 LAB — PROCALCITONIN: Procalcitonin: <0.1 ng/mL

## 2018-09-29 MED ORDER — CHLORHEXIDINE GLUCONATE 0.12 % MT SOLN
OROMUCOSAL | Status: AC
  Administered 2018-09-29: 15 mL via OROMUCOSAL
  Filled 2018-09-29: qty 15

## 2018-09-29 MED ORDER — METHYLPREDNISOLONE SODIUM SUCC 40 MG IJ SOLR
40.0000 mg | Freq: Two times a day (BID) | INTRAMUSCULAR | Status: DC
  Administered 2018-09-30 (×3): 40 mg via INTRAVENOUS
  Filled 2018-09-29 (×3): qty 1

## 2018-09-29 MED ORDER — POTASSIUM CHLORIDE 20 MEQ/15ML (10%) PO SOLN
20.0000 meq | Freq: Once | ORAL | Status: AC
  Administered 2018-09-29: 20 meq
  Filled 2018-09-29: qty 15

## 2018-09-29 MED ORDER — FUROSEMIDE 10 MG/ML IJ SOLN
20.0000 mg | Freq: Once | INTRAMUSCULAR | Status: AC
  Administered 2018-09-29: 10:00:00 20 mg via INTRAVENOUS
  Filled 2018-09-29: qty 2

## 2018-09-29 NOTE — Evaluation (Signed)
Clinical/Bedside Swallow Evaluation Patient Details  Name: Evelyn Hughes MRN: [id number] Date of Birth: 04-Mar-1956  Today's Date: 09/29/2018 Time: SLP Start Time (ACUTE ONLY): 1330 SLP Stop Time (ACUTE ONLY): 1345 SLP Time Calculation (min) (ACUTE ONLY): 15 min  Past Medical History:  Past Medical History:  Diagnosis Date  . Allergy    Past Surgical History: History reviewed. No pertinent surgical history. HPI:  Pt is 63 yo female, recently discharged for CHF with h/o tobacco abuse who returns due to declining respiratory status. Required reintubation. Extubated 09/29/18.     Assessment / Plan / Recommendation Clinical Impression  Patient presents with an oropharyngeal swallow that is [facility] with only one cough (first swallow of thins) but no subsequent overt s/s of aspiration with thin liquids, puree solids or regular solids. Patient's voice remained clear and swallow initiation was timely , laryngeal elevation and pharyngeal contraction were both WFL.  (MD discontinued order for BSE after already completed by SLP.) SLP Visit Diagnosis: Dysphagia, unspecified (R13.10)    Aspiration Risk  Mild aspiration risk    Diet Recommendation Thin liquid;Regular   Liquid Administration via: Cup;Straw Medication Administration: Whole meds with liquid Supervision: Patient able to self feed Compensations: Minimize environmental distractions Postural Changes: Seated upright at 90 degrees    Other  Recommendations Oral Care Recommendations: Patient independent with oral care;Oral care BID   Follow up Recommendations None      Frequency and Duration   N/a         Prognosis   N/a     Swallow Study   General Date of Onset: 09/27/18 HPI: Pt is 63 yo female, recently discharged for CHF with h/o tobacco abuse who returns due to declining respiratory status. Required reintubation. Extubated 09/29/18.   Type of Study: Bedside Swallow Evaluation Previous Swallow Assessment: N/A Diet Prior to  this Study: NPO Temperature Spikes Noted: No Respiratory Status: Nasal cannula History of Recent Intubation: Yes Length of Intubations (days): 3 days Date extubated: 09/29/18 Behavior/Cognition: Cooperative;Pleasant mood;Alert Oral Cavity Assessment: Within Functional Limits Oral Care Completed by SLP: Recent completion by staff Oral Cavity - Dentition: Adequate natural dentition Vision: Functional for self-feeding Self-Feeding Abilities: Able to feed self Patient Positioning: Upright in chair Baseline Vocal Quality: Hoarse(mild hoarse, but good vocal intensity and voice was clear) Volitional Cough: Strong Volitional Swallow: Able to elicit    Oral/Motor/Sensory Function Overall Oral Motor/Sensory Function: Within functional limits   Ice Chips     Thin Liquid Thin Liquid: Within functional limits Presentation: Cup;Straw Other Comments: Patient exhibited one cough after first sips of thin liquids, but no other overt s/s of aspiration after that.    Nectar Thick     Honey Thick     Puree Puree: Within functional limits   Solid     Solid: Within functional limits      Dannial Monarch 09/29/2018,5:46 PM   Sonia Baller, MA, [place] Pager: [phone]

## 2018-09-29 NOTE — Evaluation (Signed)
Physical Therapy Evaluation Patient Details Name: Evelyn Hughes MRN: [id number] DOB: [birth year] Today's Date: 09/29/2018   History of Present Illness  Pt is 63 yo female, recently discharged for CHF with h/o tobacco abuse who returns due to declining respiratory status. Required reintubation. Extubated 09/29/18.    Clinical Impression  Pt admitted with above diagnosis. Pt currently with functional limitations due to the deficits listed below (see PT Problem List). Pt received in chair on 3L O2. Pt needed min A to steady with standing and ambulation 30'. Remained on 3LO2 with SpO2 94%. Pt with confusion and unable to give a complete history or account of recent events since last admission.  Pt will benefit from skilled PT to increase their independence and safety with mobility to allow discharge to the venue listed below.       Follow Up Recommendations No PT follow up    Equipment Recommendations  None recommended by PT    Recommendations for Other Services       Precautions / Restrictions Precautions Precautions: Fall Restrictions Weight Bearing Restrictions: No      Mobility  Bed Mobility               General bed mobility comments: pt received in chair  Transfers Overall transfer level: Needs assistance Equipment used: 1 person hand held assist Transfers: Sit to/from Stand Sit to Stand: Min assist         General transfer comment: pt mildly unsteady with initial standing  Ambulation/Gait Ambulation/Gait assistance: Min guard Gait Distance (Feet): 30 Feet Assistive device: IV Pole Gait Pattern/deviations: Step-through pattern Gait velocity: decreased Gait velocity interpretation: <1.8 ft/sec, indicate of risk for recurrent falls General Gait Details: pt on 3L O2, SpO2 94%, pt fatigued quickly but no overt LOB  Stairs            Wheelchair Mobility    Modified Rankin (Stroke Patients Only)       Balance Overall balance assessment: Needs  assistance Sitting-balance support: Feet supported Sitting balance-Leahy Scale: Good     Standing balance support: No upper extremity supported Standing balance-Leahy Scale: Fair Standing balance comment: mildly unsteady                             Pertinent Vitals/Pain Pain Assessment: No/denies pain    Home Living Family/patient expects to be discharged to:: Private residence Living Arrangements: Spouse/significant other Available Help at Discharge: Family;Available 24 hours/day Type of Home: House Home Access: Stairs to enter   [employer] of Steps: 2 Home Layout: One level Home Equipment: None Additional Comments: pt is a poor historian so some info questionable, some taken from last admission.    Prior Function Level of Independence: Independent         Comments: drives, I at baseline     Hand Dominance   Dominant Hand: Right    Extremity/Trunk Assessment   Upper Extremity Assessment Upper Extremity Assessment: Generalized weakness    Lower Extremity Assessment Lower Extremity Assessment: RLE deficits/detail;Generalized weakness;LLE deficits/detail RLE Deficits / Details: RLE weaker than L. BLE swelling noted. R knee ext 2+/5, knee flex 3/5 RLE Sensation: decreased light touch;decreased proprioception RLE Coordination: decreased gross motor LLE Deficits / Details: knee ext 3+/5, knee flex 3+/5, hip flex 2+/5 LLE Sensation: decreased light touch;decreased proprioception LLE Coordination: decreased gross motor    Cervical / Trunk Assessment Cervical / Trunk Assessment: Normal  Communication   Communication: Expressive difficulties;Other (comment)(word  finding difficulties)  Cognition Arousal/Alertness: Awake/alert Behavior During Therapy: WFL for tasks assessed/performed Overall Cognitive Status: Impaired/Different from baseline Area of Impairment: Memory                     Memory: Decreased short-term  memory Following Commands: Follows one step commands consistently Safety/Judgement: Decreased awareness of deficits;Decreased awareness of safety     General Comments: pt had difficulty finding some words and mixed up some words. Several times, she reports she is confused about everything going on and that her husband could better answer questions      General Comments      Exercises     Assessment/Plan    PT Assessment Patient needs continued PT services  PT Problem List Cardiopulmonary status limiting activity;Decreased activity tolerance;Decreased balance;Decreased mobility;Decreased strength;Decreased knowledge of use of DME       PT Treatment Interventions DME instruction;Gait training;Stair training;Functional mobility training;Therapeutic activities;Balance training;Therapeutic exercise;Neuromuscular re-education    PT Goals (Current goals can be found in the Care Plan section)  Acute Rehab PT Goals Patient Stated Goal: to go home PT Goal Formulation: With patient Time For Goal Achievement: 10/13/18 Potential to Achieve Goals: Good    Frequency Min 3X/week   Barriers to discharge        Co-evaluation               AM-PAC PT "6 Clicks" Mobility  Outcome Measure Help needed turning from your back to your side while in a flat bed without using bedrails?: None Help needed moving from lying on your back to sitting on the side of a flat bed without using bedrails?: A Little Help needed moving to and from a bed to a chair (including a wheelchair)?: A Little Help needed standing up from a chair using your arms (e.g., wheelchair or bedside chair)?: A Little Help needed to walk in hospital room?: A Little Help needed climbing 3-5 steps with a railing? : A Lot 6 Click Score: 18    End of Session Equipment Utilized During Treatment: Gait belt;Oxygen Activity Tolerance: Patient tolerated treatment well Patient left: in chair;with call bell/phone within reach Nurse  Communication: Mobility status PT Visit Diagnosis: Unsteadiness on feet (R26.81);Muscle weakness (generalized) (M62.81)    Time: 9371-6967 PT Time Calculation (min) (ACUTE ONLY): 20 min   Charges:   PT Evaluation $PT Eval Moderate Complexity: [place], PT  Acute Rehab Services  Pager [phone] Office [place] 09/29/2018, 1:59 PM

## 2018-09-29 NOTE — Procedures (Signed)
Extubation Procedure Note  Patient Details:   Name: Evelyn Hughes DOB: 07/04/1955 MRN: [id number]   Airway Documentation:    Vent end date: 09/29/18 Vent end time: 1016   Evaluation  O2 sats: stable throughout Complications: No apparent complications Patient did tolerate procedure well. Bilateral Breath Sounds: Clear, Diminished   Yes   Pt extubated to 2L N/C.  No stridor noted.  RN @ bedside.  Donnetta Hail 09/29/2018, 10:17 AM

## 2018-09-29 NOTE — Progress Notes (Signed)
Received from 36M,, pt alert and oriented, not in distress Agree with previous Rn's assessment.

## 2018-09-29 NOTE — Progress Notes (Signed)
NAME:  Evelyn Hughes, MRN:  [id number], DOB:  1955/12/21, LOS: 2 ADMISSION DATE:  09/27/2018, CONSULTATION DATE:  09/27/2018 REFERRING MD: Noemi Chapel , CHIEF COMPLAINT:  Acute on chronic hypercarbic respiratory Failure   Brief History   The patient is an ill-appearing 63 year old female, current every day smoker recently admitted to the hospital ( 5/4-5/8)  and ultimately diagnosed with a combination of what was likely cor pulmonale and acute hypoxic respiratory distress and failure requiring intubation.  She self extubated and was ultimately placed on nasal cannula and  discharged home with oxygen.  Per the husband's report and per EMS report the patient has had a gradual decline especially over the last 12 to 24 hours refusing to eat or drink with AMS. Husband called 57,   Paramedics found the patient to be hypoxic,  Increased her  oxygen and transported her to the ED. She was intubated within 30 minutes of arrival. PCCM have been asked to admit to ICU  and manage  care.  Past Medical History  Acute on chronic respiratory failure with hypoxia and hypercapnia (HCC)   Acute respiratory failure (HCC) Suspected COPD   Pleural effusion on right   Other secondary pulmonary hypertension (HCC)   Acute renal failure ([place])   CHF   Tobacco Abuse  Significant Hospital Events   Admission 5/4-5/8 with intubation for respiratory failure Re-admission 5/10 for Acute on chronic Respiratory Failure  Consults:    Procedures:  5/10 ETT >>  Significant Diagnostic Tests:   5/4 CT abd/ pelvis >> 1. No acute intra-abdominal process. 2. Small ascites and mild anasarca.  5/4 CT chest >> 1. Moderate right and small left pleural effusions. No pneumonia. 2. Mild right heart enlargement with dilated main pulmonary artery, suggestive of pulmonary arterial hypertension. 3. Emphysema  4. Aortic atherosclerosis   5/4 CTH >> Minimal frontal and parietal lobe atrophy. Otherwise negative exam.   09/22/2018 Echo The left ventricle has normal systolic function, with an ejection fraction of 55-60%. The cavity size was normal. Left ventricular diastolic function could not be evaluated.  The right ventricle has normal systolc function. The cavity was mildly enlarged. Right ventricular systolic pressure is mildly elevated with an estimated pressure of 46.6 mmHg.  Right atrial size was mildly dilated.  The mitral valve is grossly normal.  The tricuspid valve was grossly normal.  The aortic valve is tricuspid No stenosis of the aortic valve.  Limited study; normal LV function; mild RAE and RVE; moderate RV dysfunction; trace TR; mild pulmonary hypertension.  5/5 Doppler Studies Right: There is no evidence of deep vein thrombosis in the lower extremity. No cystic structure found in the popliteal fossa. Left: There is no evidence of deep vein thrombosis in the lower extremity. No cystic structure found in the popliteal fossa.  Micro Data:  5/10 MRSA PCR >> neg 5/10 Blood Cultures x 2 >> 5/10 Tracheal Aspirate >> normal flora 5/10 Urine Legionella >> 5/10 Urine Strep >> neg 5/10 Covid Negative  Antimicrobials:  Cefepime 5/10>> Vanc 5/10 >> 5/12  Interim history/subjective:  Afebrile, PCT neg WBC 26-> 30k Weaning well since 8am 5/5 no issues Off fentanyl  Hemodynamically stable -683/ net +143, wts are up 1.5 kg  Patient with no complaints- asking for ETT to be removed  Objective   Blood pressure 119/67, pulse 81, temperature 98.6 F (37 C), resp. rate 16, height 5\' 5"  (1.651 m), weight 51.2 kg, SpO2 97 %.    Vent Mode: CPAP;PSV FiO2 (%):  [  40 %] 40 % Set Rate:  [16 bmp] 16 bmp Vt Set:  [450 mL] 450 mL PEEP:  [5 cmH20] 5 cmH20 Pressure Support:  [5 cmH20] 5 cmH20 Plateau Pressure:  [13 cmH20-19 cmH20] 15 cmH20   Intake/Output Summary (Last 24 hours) at 09/29/2018 1224 Last data filed at 09/29/2018 0600 Gross per 24 hour  Intake 1131.15 ml  Output 1815 ml  Net -683.85 ml    Filed Weights   09/27/18 0648 09/28/18 0458 09/29/18 0436  Weight: 48 kg 50 kg 51.2 kg    Examination: General:  Adult female sitting upright in bed in NAD HEENT: MM pink/moist, lips cracked, ETT at 22, OGT, pupils 3/reactive, anicteric  Neuro: Awake, f/c, MAE CV: SR, no murmur PULM: even/non-labored, lungs bilaterally clear, no wheeze, slightly diminished in bases, currently on PSV 5/5, good TVs, RSBI 26 GI: soft, non-tender, hypo BS  Extremities: warm/dry, no edema  Skin: no rashes   Resolved Hospital Problem list   Hyponatremia, Acute encephalopathy  Assessment & Plan:  Acute hypoxic/ hypercarbic respiratory failure- possibly multifactorial, evidence of acute volume overload, emphysematous changes,  Persistent small bilateral pleural effusions and bibasilar atelectasis. Improved right basilar lung aeration. Tobacco abuse Suspected COPD - CXR review- stable P:  Extubate  Lasix 20 mg x 1 now Aggressive pulmonary hygiene Titrate supplemental O2 for goal sats 90-96% Continue duonebs TID and prn  Decrease solumedrol to 40 mg q 12 See below  D/c PAD protocol   Leukocytosis  - remains afebrile with negative PCT, although WBC continues to increase, possibly related to steroids  P:  Follow blood cultures  D/c vanc Continue cefepime  Pending urine legionella  Diastolic heart failure Recent Echo 5/5 P:  Keep I/O even Lasix 20 meq x 1 with KCL 20 meq  Best practice:  Diet: advance diet per extubation protocol  Pain/Anxiety/Delirium protocol (if indicated): d/c VAP protocol (if indicated): Ordered DVT prophylaxis: Heparin SQ  GI prophylaxis: Pepcid Glucose control: SSI/ CBG Mobility: BR Code Status: Full Family Communication: pending update Disposition: ICU  Labs   CBC: Recent Labs  Lab 09/25/18 0313 09/27/18 0718  09/27/18 1036 09/27/18 1117 09/28/18 0412 09/28/18 1126 09/29/18 0248  WBC 16.6* 14.7*  --  13.8*  --  26.1*  --  30.2*  NEUTROABS  --   12.1*  --   --   --   --   --   --   HGB 12.9 14.2   < > 13.6 16.3* 14.0 15.0 12.8  HCT 42.6 50.3*   < > 44.2 48.0* 45.2 44.0 40.5  MCV 89.7 94.0  --  92.5  --  86.4  --  85.6  PLT 151 216  --  207  --  246  --  257   < > = values in this interval not displayed.    Basic Metabolic Panel: Recent Labs  Lab 09/24/18 0412 09/25/18 0313 09/27/18 0718  09/27/18 1036 09/27/18 1117 09/28/18 0412 09/28/18 1126 09/29/18 0248  NA 133* 134* 136   < > 130* 133* 136 134* 134*  K 3.1* 4.3 4.9   < > 5.3* 4.0 4.8 4.5 4.0  CL 81* 80* 82*  --  83*  --  88*  --  87*  CO2 43* 43* 41*  --  30  --  35*  --  32  GLUCOSE 96 101* 106*  --  122*  --  134*  --  133*  BUN 17 11 19   --  21  --  27*  --  36*  CREATININE 0.54 0.54 0.50  --  0.47  --  0.71  --  0.54  CALCIUM 7.4* 7.8* 8.7*  --  8.3*  --  9.1  --  8.5*  MG 1.5* 2.2  --   --  2.2  --  1.9  --  1.7  PHOS 1.9* 2.0*  --   --  2.3*  --  2.1*  --  3.5   < > = values in this interval not displayed.   GFR: Estimated Creatinine Clearance: 58.9 mL/min (by C-G formula based on SCr of 0.54 mg/dL). Recent Labs  Lab 09/27/18 0718 09/27/18 0903 09/27/18 1036 09/28/18 0412 09/29/18 0248  PROCALCITON  --   --  0.17 0.14 <0.10  WBC 14.7*  --  13.8* 26.1* 30.2*  LATICACIDVEN 0.7 2.0*  --  2.2*  --     Liver Function Tests: Recent Labs  Lab 09/23/18 0445 09/24/18 0412 09/25/18 0313 09/27/18 0718 09/27/18 1036  AST 255* 193* 153* 68* 90*  ALT 480* 435* 386* 263* 234*  ALKPHOS 56 53 54 68 70  BILITOT 1.7* 1.5* 1.1 1.1 2.5*  PROT 4.0* 4.2* 4.4* 5.2* RESULTS UNAVAILABLE DUE TO INTERFERING SUBSTANCE  ALBUMIN 2.4* 2.5* 2.6* 2.9* 2.7*   No results for input(s): LIPASE, AMYLASE in the last 168 hours. No results for input(s): AMMONIA in the last 168 hours.  ABG    Component Value Date/Time   PHART 7.476 (H) 09/29/2018 0314   PCO2ART 53.3 (H) 09/29/2018 0314   PO2ART 87.5 09/29/2018 0314   HCO3 38.9 (H) 09/29/2018 0314   TCO2 38 (H) 09/28/2018  1126   ACIDBASEDEF 2.0 09/21/2018 1251   O2SAT 97.3 09/29/2018 0314     Coagulation Profile: Recent Labs  Lab 09/24/18 0412 09/27/18 1036  INR 1.2 1.0    Cardiac Enzymes: Recent Labs  Lab 09/27/18 0718 09/28/18 0412  TROPONINI <0.03 <0.03    HbA1C: No results found for: HGBA1C  CBG: Recent Labs  Lab 09/28/18 1516 09/28/18 2104 09/29/18 0011 09/29/18 0411 09/29/18 0812  GLUCAP 133* 151* 119* 133* 116*   CCT 35 mins  Kennieth Rad, MSN, AGACNP-BC [facility] Pgr: [phone] or if no answer [phone] 09/29/2018, 9:23 AM

## 2018-09-30 LAB — RENAL FUNCTION PANEL
Albumin: 2.5 g/dL — ABNORMAL LOW (ref 3.5–5.0)
Anion gap: 9 (ref 5–15)
BUN: 29 mg/dL — ABNORMAL HIGH (ref 8–23)
CO2: 34 mmol/L — ABNORMAL HIGH (ref 22–32)
Calcium: 8.5 mg/dL — ABNORMAL LOW (ref 8.9–10.3)
Chloride: 86 mmol/L — ABNORMAL LOW (ref 98–111)
Creatinine, Ser: 0.58 mg/dL (ref 0.44–1.00)
GFR calc Af Amer: >60 mL/min (ref >60)
GFR calc non Af Amer: >60 mL/min (ref >60)
Glucose, Bld: 107 mg/dL — ABNORMAL HIGH (ref 70–99)
Phosphorus: 3.2 mg/dL (ref 2.5–4.6)
Potassium: 4.1 mmol/L (ref 3.5–5.1)
Sodium: 129 mmol/L — ABNORMAL LOW (ref 135–145)

## 2018-09-30 LAB — CULTURE, RESPIRATORY W GRAM STAIN
Culture: NORMAL
Special Requests: NORMAL

## 2018-09-30 LAB — CBC
HCT: 38.5 % (ref 36.0–46.0)
Hemoglobin: 12.6 g/dL (ref 12.0–15.0)
MCH: 27.6 pg (ref 26.0–34.0)
MCHC: 32.7 g/dL (ref 30.0–36.0)
MCV: 84.2 fL (ref 80.0–100.0)
Platelets: 249 10*3/uL (ref 150–400)
RBC: 4.57 MIL/uL (ref 3.87–5.11)
RDW: 14.8 % (ref 11.5–15.5)
WBC: 25 10*3/uL — ABNORMAL HIGH (ref 4.0–10.5)
nRBC: 0 % (ref 0.0–0.2)

## 2018-09-30 LAB — MAGNESIUM: Magnesium: 1.5 mg/dL — ABNORMAL LOW (ref 1.7–2.4)

## 2018-09-30 MED ORDER — SODIUM CHLORIDE 0.9 % IV SOLN
INTRAVENOUS | Status: DC | PRN
  Administered 2018-09-30 – 2018-10-01 (×2): 250 mL via INTRAVENOUS

## 2018-09-30 MED ORDER — ADULT MULTIVITAMIN W/MINERALS CH
1.0000 | ORAL_TABLET | Freq: Every day | ORAL | Status: DC
  Administered 2018-09-30 – 2018-10-06 (×7): 1 via ORAL
  Filled 2018-09-30 (×7): qty 1

## 2018-09-30 MED ORDER — ENSURE ENLIVE PO LIQD
237.0000 mL | Freq: Two times a day (BID) | ORAL | Status: DC
  Administered 2018-09-30 – 2018-10-06 (×11): 237 mL via ORAL

## 2018-09-30 MED ORDER — MAGNESIUM SULFATE 4 GM/100ML IV SOLN
4.0000 g | Freq: Once | INTRAVENOUS | Status: AC
  Administered 2018-09-30: 4 g via INTRAVENOUS
  Filled 2018-09-30: qty 100

## 2018-09-30 NOTE — Progress Notes (Signed)
Physical Therapy Treatment Patient Details Name: Evelyn Hughes MRN: [id number] DOB: Jul 28, 1955 Today's Date: 09/30/2018    History of Present Illness Pt is 63 yo female, recently discharged for CHF, COPD with h/o tobacco abuse who returns due to declining respiratory status. Required reintubation. Extubated 09/29/18.      PT Comments    Patient cont to have confusion. Ambulating short distances with RW and fatigue, satting well on 3L. Updating recs to include HHPT as patient is not at her baseline level of mobility.   Follow Up Recommendations  HHPT     Equipment Recommendations  None recommended by PT    Recommendations for Other Services       Precautions / Restrictions Precautions Precautions: Fall Restrictions Weight Bearing Restrictions: No    Mobility  Bed Mobility Overal bed mobility: Needs Assistance Bed Mobility: Supine to Sit     Supine to sit: Supervision;HOB elevated     General bed mobility comments: use of bed rail and increased time  Transfers Overall transfer level: Needs assistance Equipment used: 1 person hand held assist;Rolling walker (2 wheeled) Transfers: Sit to/from Stand Sit to Stand: Min assist         General transfer comment: initially Pt was min A from bed with A for steady/balance and boost with HHA, from [facility] she was min A with vc for safety for use of RW but more balanced  Ambulation/Gait Ambulation/Gait assistance: Min guard Gait Distance (Feet): 20 Feet Assistive device: Rolling walker (2 wheeled) Gait Pattern/deviations: Step-through pattern Gait velocity: decreased   General Gait Details: pt on 3L O2, SpO2 94%, pt fatigued quickly but no overt LOB   Stairs             Wheelchair Mobility    Modified Rankin (Stroke Patients Only)       Balance Overall balance assessment: Needs assistance Sitting-balance support: Feet supported Sitting balance-Leahy Scale: Good     Standing balance support: Single  extremity supported;Bilateral upper extremity supported Standing balance-Leahy Scale: Fair Standing balance comment: mildly unsteady - reaches out for external support                            Cognition Arousal/Alertness: Awake/alert Behavior During Therapy: WFL for tasks assessed/performed Overall Cognitive Status: Impaired/Different from baseline Area of Impairment: Memory;Attention;Safety/judgement;Awareness;Problem solving                   Current Attention Level: Sustained Memory: Decreased short-term memory Following Commands: Follows one step commands consistently;Follows one step commands with increased time Safety/Judgement: Decreased awareness of deficits;Decreased awareness of safety Awareness: Emergent Problem Solving: Slow processing;Difficulty sequencing;Requires verbal cues General Comments: Pt thought that cars were driving outside on the roof of the building, confusion as to why she was at the hospital, able to complete simple and familiar tasks with cues      Exercises      General Comments General comments (skin integrity, edema, etc.): spoke with husband [name] on the phone for approx 25 min. He is a Curator, and is available 24 hours at dc. He provided PLOF and home information      Pertinent Vitals/Pain Pain Assessment: 0-10 Pain Score: 4  Pain Location: BLE Pain Descriptors / Indicators: Discomfort;Sore Pain Intervention(s): Limited activity within patient's tolerance    Home Living Family/patient expects to be discharged to:: Private residence Living Arrangements: Spouse/significant other Available Help at Discharge: Family;Available 24 hours/day Type of Home: House Home Access:  Stairs to enter Entrance Stairs-Rails: Right Home Layout: One level Home Equipment: None Additional Comments: confirmed home set up with husband - [name]    Prior Function Level of Independence: Independent      Comments: drives, retired from the state,  independent typically   PT Goals (current goals can now be found in the care plan section) Acute Rehab PT Goals Patient Stated Goal: "to be able to make a cup of coffee" PT Goal Formulation: With patient Time For Goal Achievement: 10/13/18 Potential to Achieve Goals: Good Progress towards PT goals: Progressing toward goals    Frequency    Min 3X/week      PT Plan Current plan remains appropriate    Co-evaluation              AM-PAC PT "6 Clicks" Mobility   Outcome Measure  Help needed turning from your back to your side while in a flat bed without using bedrails?: None Help needed moving from lying on your back to sitting on the side of a flat bed without using bedrails?: A Little Help needed moving to and from a bed to a chair (including a wheelchair)?: A Little Help needed standing up from a chair using your arms (e.g., wheelchair or bedside chair)?: A Little Help needed to walk in hospital room?: A Little Help needed climbing 3-5 steps with a railing? : A Lot 6 Click Score: 18    End of Session Equipment Utilized During Treatment: Gait belt;Oxygen Activity Tolerance: Patient tolerated treatment well Patient left: in chair;with call bell/phone within reach Nurse Communication: Mobility status PT Visit Diagnosis: Unsteadiness on feet (R26.81);Muscle weakness (generalized) (M62.81)     Time: 3016-0109 PT Time Calculation (min) (ACUTE ONLY): 15 min  Charges:  $Gait Training: 8-22 mins                     Reinaldo Berber, PT, DPT Acute Rehabilitation Services Pager: [phone] Office: [place] 09/30/2018, 4:33 PM

## 2018-09-30 NOTE — Plan of Care (Signed)
  Problem: Clinical Measurements: Goal: Respiratory complications will improve Outcome: Progressing   Problem: Activity: Goal: Risk for activity intolerance will decrease Outcome: Progressing   Problem: Coping: Goal: Level of anxiety will decrease Outcome: Progressing   Problem: Safety: Goal: Ability to remain free from injury will improve Outcome: Progressing   

## 2018-09-30 NOTE — Progress Notes (Signed)
Nutrition Follow-up  RD working remotely.  DOCUMENTATION CODES:   Underweight  INTERVENTION:   -Ensure Enlive po BID, each supplement provides 350 kcal and 20 grams of protein -MVI with minerals daily  NUTRITION DIAGNOSIS:   Inadequate oral intake related to acute illness as evidenced by meal completion < 50%.  Ongoing  GOAL:   Patient will meet greater than or equal to 90% of their needs  Progressing   MONITOR:   PO intake, Supplement acceptance, Weight trends, Labs, Skin, I & O's  REASON FOR ASSESSMENT:   Consult, Ventilator Enteral/tube feeding initiation and management  ASSESSMENT:   63 yo female with recent admission from 5/04 to 5/08 with intubation for respiratory failure. Pt discharged home but required readmission on 5/10 with acute on chronic respiratory failure requiring intubation. PMH includes suspected COPD, CHF, tobacco abuse  5/12- extubated, s/p BSE- advanced to regular diet with thin liquids, transferred from ICU to floor  Reviewed I/O's: -514 ml x 24 hours and -370 ml since admission  UOP: 1.4 L x 24 hours  Attempted to call pt x 3, however, unable to reach (received "user busy" message). Unable to obtain further nutrition-related history at this time.   Per meal completion records, meal completion 50%. Pt with poor oral intake and would benefit from nutrient dense supplement. One Ensure Enlive supplement provides 350 kcals, 20 grams protein, and 44-45 grams of carbohydrate vs one Glucerna shake supplement, which provides 220 kcals, 10 grams of protein, and 26 grams of carbohydrate. Given pt's steroid use, RD will continue to monitor PO intake, CBGS, and adjust supplement regimen as appropriate.   Reviewed wt hx, which reveals wt gain since last admission last month, however, wt changes difficult to assess given volume overload.   Medications reviewed and include solu-medrol.   Labs reviewed: Na: 129, Mg: 1.5, CBGS: 108-122  Diet Order:   Diet  Order            Diet regular Room service appropriate? Yes with Assist; Fluid consistency: Thin  Diet effective now              EDUCATION NEEDS:   No education needs have been identified at this time  Skin:  Skin Assessment: Skin Integrity Issues: Skin Integrity Issues:: Stage I, Other (Comment) Stage I: sacrum, buttocks Other: MASD to buttocks and anus  Last BM:  09/29/18  Height:   Ht Readings from Last 1 Encounters:  09/29/18 5\' 5"  (1.651 m)    Weight:   Wt Readings from Last 1 Encounters:  09/30/18 51.5 kg    Ideal Body Weight:  56.8 kg  BMI:  Body mass index is 18.9 kg/m.  Estimated Nutritional Needs:   Kcal:  1600-1800  Protein:  80-95 grams  Fluid:  1.6-1.8 L    Evelyn Hughes A. Jimmye Norman, RD, LDN, [place] Registered Dietitian II Certified Diabetes Care and Education Specialist Pager: [phone] After hours Pager: [phone]

## 2018-09-30 NOTE — Progress Notes (Signed)
PROGRESS NOTE  Evelyn Hughes VQQ:[id number] DOB: 1956/04/04 DOA: 09/27/2018 PCP: Patient, No Pcp Per  HPI/Recap of past 24 hours: The patient is an ill-appearing 63 year old female, current every day smoker recently admitted to the hospital ( 5/4-5/8)  and ultimately diagnosed with a combination of what was likely cor pulmonale and acute hypoxic respiratory distress and failure requiring intubation. She self extubated and was ultimately placed on nasal cannula and discharged home with oxygen. Per the husband's report and per EMS report the patient has had a gradual decline especially over the last 12 to 24 hours refusing to eat or drink with AMS. Husband called 29, Paramedics found the patient to be hypoxic, Increased her oxygen and transported her to the ED. She was intubated within 30 minutes of arrival. PCCM have been asked to admit to ICU  and manage care.  TRH transfer of care on 09/30/2018.  09/30/18: Patient seen and examined at her bedside.  She states she feels better after her extubation yesterday 09/29/2018.  She denies any chest pain or dyspnea at rest.  O2 saturation 97% on 2 L.  Assessment/Plan: Active Problems:   Respiratory failure with hypoxia and hypercapnia (HCC)   Pressure injury of skin  Resolving acute hypoxic hypercarbic respiratory failure, multifactorial secondary to acute volume overload, bilateral pleural effusion, COPD exacerbation Extubated on 09/29/2018 Currently on 2 L of oxygen by nasal cannula and saturating 97% On Solu-Medrol which is been weaned off to 40 mg twice daily Continue cefepime Continue to closely monitor O2 sat  Hypervolemic hyponatremia Sodium 129 Fluid restriction P BMP in the morning  Hypomagnesemia Magnesium 1.5 Repleted  Chronic diastolic CHF Last 2D echo done on 09/26/2018 revealed preserved LVEF 55 to 60% Continue Lasix Continue strict I's and O's and daily weight  COPD exacerbation Continue Solu-Medrol Continue duo nebs  Continue O2 supplementation  Tobacco use disorder Counseled on tobacco cessation at bedside   Code Status: Full  Family Communication: Will call to update over the phone  Disposition Plan: Possible discharge tomorrow 10/01/2018   Consultants:  PCCM  Procedures:  Extubation on 09/29/2018  Antimicrobials: Cefepime  DVT prophylaxis: Subcu heparin 3 times daily   Objective: Vitals:   09/30/18 0013 09/30/18 0442 09/30/18 0828 09/30/18 0851  BP: 119/72 103/73  119/71  Pulse: 95 92  89  Resp: 18 18  20   Temp: 98.3 F (36.8 C) 98.3 F (36.8 C)  98.4 F (36.9 C)  TempSrc: Oral Oral  Oral  SpO2: 92% 93% 94% 97%  Weight:  51.5 kg    Height:        Intake/Output Summary (Last 24 hours) at 09/30/2018 1155 Last data filed at 09/30/2018 1031 Gross per 24 hour  Intake 1026 ml  Output 1701 ml  Net -675 ml   Filed Weights   09/29/18 0436 09/29/18 1831 09/30/18 0442  Weight: 51.2 kg 51.3 kg 51.5 kg    Exam:  . General: 63 y.o. year-old female well developed well nourished in no acute distress.  Alert and oriented x3. . Cardiovascular: Regular rate and rhythm with no rubs or gallops.  No thyromegaly or JVD noted.   Marland Kitchen Respiratory: Mild rales at bases bilaterally.  No wheezes noted.  Good inspiratory effort.   . Abdomen: Soft nontender nondistended with normal bowel sounds x4 quadrants. . Musculoskeletal: 2+ pitting edema bilaterally lower extremity. 2/4 pulses in all 4 extremities. Marland Kitchen Psychiatry: Mood is appropriate for condition and setting   Data Reviewed: CBC: Recent Labs  Lab  09/27/18 0718  09/27/18 1036 09/27/18 1117 09/28/18 0412 09/28/18 1126 09/29/18 0248 09/30/18 0332  WBC 14.7*  --  13.8*  --  26.1*  --  30.2* 25.0*  NEUTROABS 12.1*  --   --   --   --   --   --   --   HGB 14.2   < > 13.6 16.3* 14.0 15.0 12.8 12.6  HCT 50.3*   < > 44.2 48.0* 45.2 44.0 40.5 38.5  MCV 94.0  --  92.5  --  86.4  --  85.6 84.2  PLT 216  --  207  --  246  --  257 249   < >  = values in this interval not displayed.   Basic Metabolic Panel: Recent Labs  Lab 09/25/18 0313 09/27/18 0718  09/27/18 1036 09/27/18 1117 09/28/18 0412 09/28/18 1126 09/29/18 0248 09/30/18 0332  NA 134* 136   < > 130* 133* 136 134* 134* 129*  K 4.3 4.9   < > 5.3* 4.0 4.8 4.5 4.0 4.1  CL 80* 82*  --  83*  --  88*  --  87* 86*  CO2 43* 41*  --  30  --  35*  --  32 34*  GLUCOSE 101* 106*  --  122*  --  134*  --  133* 107*  BUN 11 19  --  21  --  27*  --  36* 29*  CREATININE 0.54 0.50  --  0.47  --  0.71  --  0.54 0.58  CALCIUM 7.8* 8.7*  --  8.3*  --  9.1  --  8.5* 8.5*  MG 2.2  --   --  2.2  --  1.9  --  1.7 1.5*  PHOS 2.0*  --   --  2.3*  --  2.1*  --  3.5 3.2   < > = values in this interval not displayed.   GFR: Estimated Creatinine Clearance: 59.3 mL/min (by C-G formula based on SCr of 0.58 mg/dL). Liver Function Tests: Recent Labs  Lab 09/24/18 0412 09/25/18 0313 09/27/18 0718 09/27/18 1036 09/30/18 0332  AST 193* 153* 68* 90*  --   ALT 435* 386* 263* 234*  --   ALKPHOS 53 54 68 70  --   BILITOT 1.5* 1.1 1.1 2.5*  --   PROT 4.2* 4.4* 5.2* RESULTS UNAVAILABLE DUE TO INTERFERING SUBSTANCE  --   ALBUMIN 2.5* 2.6* 2.9* 2.7* 2.5*   No results for input(s): LIPASE, AMYLASE in the last 168 hours. No results for input(s): AMMONIA in the last 168 hours. Coagulation Profile: Recent Labs  Lab 09/24/18 0412 09/27/18 1036  INR 1.2 1.0   Cardiac Enzymes: Recent Labs  Lab 09/27/18 0718 09/28/18 0412  TROPONINI <0.03 <0.03   BNP (last 3 results) No results for input(s): PROBNP in the last 8760 hours. HbA1C: No results for input(s): HGBA1C in the last 72 hours. CBG: Recent Labs  Lab 09/29/18 0011 09/29/18 0411 09/29/18 0812 09/29/18 1159 09/29/18 1554  GLUCAP 119* 133* 116* 108* 122*   Lipid Profile: No results for input(s): CHOL, HDL, LDLCALC, TRIG, CHOLHDL, LDLDIRECT in the last 72 hours. Thyroid Function Tests: No results for input(s): TSH, T4TOTAL,  FREET4, T3FREE, THYROIDAB in the last 72 hours. Anemia Panel: No results for input(s): VITAMINB12, FOLATE, FERRITIN, TIBC, IRON, RETICCTPCT in the last 72 hours. Urine analysis:    Component Value Date/Time   COLORURINE AMBER (A) 09/27/2018 0820   APPEARANCEUR CLEAR 09/27/2018 0820  LABSPEC 1.019 09/27/2018 0820   PHURINE 6.0 09/27/2018 0820   GLUCOSEU NEGATIVE 09/27/2018 0820   HGBUR NEGATIVE 09/27/2018 0820   BILIRUBINUR NEGATIVE 09/27/2018 0820   KETONESUR 5 (A) 09/27/2018 0820   PROTEINUR 30 (A) 09/27/2018 0820   NITRITE NEGATIVE 09/27/2018 0820   LEUKOCYTESUR NEGATIVE 09/27/2018 0820   Sepsis Labs: @LABRCNTIP (procalcitonin:4,lacticidven:4)  ) Recent Results (from the past 240 hour(s))  Culture, blood (routine x 2)     Status: None   Collection Time: 09/21/18 12:44 PM  Result Value Ref Range Status   Specimen Description   Final    BLOOD LEFT HAND Performed at [facility], [address] [place], [place] [address]    Special Requests   Final    BOTTLES DRAWN AEROBIC AND ANAEROBIC Blood Culture adequate volume Performed at [facility], [address] [place], [place] [address]    Culture   Final    NO GROWTH 6 DAYS Performed at [facility], [place] [address] [place], [place] [address]    Report Status 09/27/2018 FINAL  Final  Culture, blood (routine x 2)     Status: None   Collection Time: 09/21/18  1:01 PM  Result Value Ref Range Status   Specimen Description   Final    RIGHT ANTECUBITAL Performed at [facility], [address] [place], [place] [address]    Special Requests   Final    BOTTLES DRAWN AEROBIC AND ANAEROBIC Blood Culture adequate volume Performed at [facility], [address] [place], [place] [address]    Culture   Final    NO GROWTH 6 DAYS Performed at [facility], [place] [address], [place], [place] [address]    Report Status 09/27/2018 FINAL  Final  SARS Coronavirus 2 (Hosp  order,Performed in [facility] lab via [employer] ID)     Status: None   Collection Time: 09/21/18  1:01 PM  Result Value Ref Range Status   SARS Coronavirus 2 (Abb[employer] ID Now) NEGATIVE NEGATIVE Final    Comment: (NOTE) Interpretive Result Comment(s): COVID 19 Positive SARS CoV 2 target nucleic acids are DETECTED. The SARS CoV 2 RNA is generally detectable in upper and lower respiratory specimens during the acute phase of infection.  Positive results are indicative of active infection with SARS CoV 2.  Clinical correlation with patient history and other diagnostic information is necessary to determine patient infection status.  Positive results do not rule out bacterial infection or coinfection with other viruses. The expected result is Negative. COVID 19 Negative SARS CoV 2 target nucleic acids are NOT DETECTED. The SARS CoV 2 RNA is generally detectable in upper and lower respiratory specimens during the acute phase of infection.  Negative results do not preclude SARS CoV 2 infection, do not rule out coinfections with other pathogens, and should not be used as the sole basis for treatment or other patient management decisions.  Negative results must be combined with clinical  observations, patient history, and epidemiological information. The expected result is Negative. Invalid Presence or absence of SARS CoV 2 nucleic acids cannot be determined. Repeat testing was performed on the submitted specimen and repeated Invalid results were obtained.  If clinically indicated, additional testing on a new specimen with an alternate test methodology [phone]) is advised.  The SARS CoV 2 RNA is generally detectable in upper and lower respiratory specimens during the acute phase of infection. The expected result is  Negative. Fact Sheet for Patients:  GolfingFamily.no Fact Sheet for Healthcare Providers: https://www.hernandez-brewer.com/ This test is not yet  approved or cleared by the [place] FDA and has been authorized for detection and/or diagnosis of SARS CoV 2 by FDA under an Emergency Use Authorization (EUA).  This EUA will remain in effect (meaning this test can be used) for the duration of the COVID19 d eclaration under Section 564(b)(1) of the Act, 21 U.S.C. section 7577825843 3(b)(1), unless the authorization is terminated or revoked sooner. Performed at [facility], [address] [place], [place] [address]   MRSA PCR Screening     Status: None   Collection Time: 09/21/18  9:43 PM  Result Value Ref Range Status   MRSA by PCR NEGATIVE NEGATIVE Final    Comment:        The GeneXpert MRSA Assay (FDA approved for NASAL specimens only), is one component of a comprehensive MRSA colonization surveillance program. It is not intended to diagnose MRSA infection nor to guide or monitor treatment for MRSA infections. Performed at [facility], [place] [address], [place], [place] [address]   SARS Coronavirus 2 (CEPHEID - Performed in [facility] lab), Hosp Order     Status: None   Collection Time: 09/27/18  7:00 AM  Result Value Ref Range Status   SARS Coronavirus 2 NEGATIVE NEGATIVE Final    Comment: (NOTE) If result is NEGATIVE SARS-CoV-2 target nucleic acids are NOT DETECTED. The SARS-CoV-2 RNA is generally detectable in upper and lower  respiratory specimens during the acute phase of infection. The lowest  concentration of SARS-CoV-2 viral copies this assay can detect is 250  copies / mL. A negative result does not preclude SARS-CoV-2 infection  and should not be used as the sole basis for treatment or other  patient management decisions.  A negative result may occur with  improper specimen collection / handling, submission of specimen other  than nasopharyngeal swab, presence of viral mutation(s) within the  areas targeted by this assay, and inadequate number of viral copies  (<250 copies / mL). A  negative result must be combined with clinical  observations, patient history, and epidemiological information. If result is POSITIVE SARS-CoV-2 target nucleic acids are DETECTED. The SARS-CoV-2 RNA is generally detectable in upper and lower  respiratory specimens dur ing the acute phase of infection.  Positive  results are indicative of active infection with SARS-CoV-2.  Clinical  correlation with patient history and other diagnostic information is  necessary to determine patient infection status.  Positive results do  not rule out bacterial infection or co-infection with other viruses. If result is PRESUMPTIVE POSTIVE SARS-CoV-2 nucleic acids MAY BE PRESENT.   A presumptive positive result was obtained on the submitted specimen  and confirmed on repeat testing.  While 2019 novel coronavirus  (SARS-CoV-2) nucleic acids may be present in the submitted sample  additional confirmatory testing may be necessary for epidemiological  and / or clinical management purposes  to differentiate between  SARS-CoV-2 and other Sarbecovirus currently known to infect humans.  If clinically indicated additional testing with an alternate test  methodology [phone]) is advised. The SARS-CoV-2 RNA is generally  detectable in upper and lower respiratory sp ecimens during the acute  phase of infection. The expected result is Negative. Fact Sheet for Patients:  StrictlyIdeas.no Fact Sheet for Healthcare Providers: BankingDealers.co.za This test is not yet approved or cleared by the [place] FDA and has been authorized for detection and/or  diagnosis of SARS-CoV-2 by FDA under an Emergency Use Authorization (EUA).  This EUA will remain in effect (meaning this test can be used) for the duration of the COVID-19 declaration under Section 564(b)(1) of the Act, 21 U.S.C. section 360bbb-3(b)(1), unless the authorization is terminated or revoked sooner. Performed  at [facility], [place] [address], [place], [place] [address]   Blood Culture (routine x 2)     Status: None (Preliminary result)   Collection Time: 09/27/18  7:21 AM  Result Value Ref Range Status   Specimen Description BLOOD RIGHT HAND  Final   Special Requests   Final    BOTTLES DRAWN AEROBIC AND ANAEROBIC Blood Culture adequate volume   Culture   Final    NO GROWTH 2 DAYS Performed at [facility], [place] [address], [place], [place] [address]    Report Status PENDING  Incomplete  Blood Culture (routine x 2)     Status: None (Preliminary result)   Collection Time: 09/27/18  7:26 AM  Result Value Ref Range Status   Specimen Description BLOOD LEFT HAND  Final   Special Requests   Final    BOTTLES DRAWN AEROBIC AND ANAEROBIC Blood Culture adequate volume   Culture   Final    NO GROWTH 2 DAYS Performed at [facility], [place] [address], [place], [place] [address]    Report Status PENDING  Incomplete  Culture, respiratory (tracheal aspirate)     Status: None   Collection Time: 09/27/18 10:02 AM  Result Value Ref Range Status   Specimen Description TRACHEAL ASPIRATE  Final   Special Requests Normal  Final   Gram Stain   Final    FEW WBC PRESENT, PREDOMINANTLY PMN RARE GRAM POSITIVE COCCI IN PAIRS    Culture   Final    RARE Consistent with normal respiratory flora. Performed at [facility], [place] [address] [place], [place] [address]    Report Status 09/30/2018 FINAL  Final  MRSA PCR Screening     Status: None   Collection Time: 09/27/18 11:48 AM  Result Value Ref Range Status   MRSA by PCR NEGATIVE NEGATIVE Final    Comment:        The GeneXpert MRSA Assay (FDA approved for NASAL specimens only), is one component of a comprehensive MRSA colonization surveillance program. It is not intended to diagnose MRSA infection nor to guide or monitor treatment for MRSA infections. Performed at [facility], [place] [address] [place], [place] [address]    Culture, Urine     Status: None   Collection Time: 09/27/18  5:13 PM  Result Value Ref Range Status   Specimen Description URINE, CLEAN CATCH  Final   Special Requests NONE  Final   Culture   Final    NO GROWTH Performed at [facility], [place] [address], [place], [place] [address]    Report Status 09/28/2018 FINAL  Final      Studies: No results found.  Scheduled Meds: . Chlorhexidine Gluconate Cloth  6 each Topical Daily  . feeding supplement (ENSURE ENLIVE)  237 mL Oral BID BM  . heparin  5,000 Units Subcutaneous Q8H  . ipratropium-albuterol  3 mL Nebulization TID  . methylPREDNISolone (SOLU-MEDROL) injection  40 mg Intravenous Q12H  . multivitamin with minerals  1 tablet Oral Daily    Continuous Infusions: . sodium chloride    . sodium chloride 250 mL (09/30/18 0016)  . ceFEPime (MAXIPIME) IV  2 g (09/30/18 0650)     LOS: 3 days     Kayleen Memos, MD [facility] Pager [phone]  If 7PM-7AM, please contact night-coverage [email or web address] Password [facility] 09/30/2018, 11:55 AM

## 2018-09-30 NOTE — Evaluation (Signed)
Occupational Therapy Evaluation Patient Details Name: Evelyn Hughes MRN: [id number] DOB: November 18, 1955 Today's Date: 09/30/2018    History of Present Illness Pt is 63 yo female, recently discharged for CHF, COPD with h/o tobacco abuse who returned and re-admitted due to declining respiratory status. Required reintubation. Extubated 09/29/18.     Clinical Impression   PLOF obtained from husband via phone. He states that Pt PTA (initial admission) was independent in ADL/IADL and was driving. Since her dc from recent hospitalization she has demonstrated cognitive deficits and while she could perform ADL mod I, she required assist for IADL and 24 hour supervision. Pt today was min A for transfers with HHA and RW. She was min A for standing grooming tasks at sink, and min A for LB ADL (unable to perform figure four positioning to access feet) She has cognitive deficits and confusion throughout session with increased time for processing and word finding difficulties. Pt will benefit from skilled OT in the acute setting as well as afterwards at the [facility] level to address not only ADL independence - but IADL performance like cooking and medicine management as she was completing those tasks. Next session plan on pill box test for cognition and continue OOB movement.  Of note: on 2L O2 when OT entered the room and saturations were at 90%. Bumped up to 4L for in room mobility and functional ADL tasks and saturations were 88-90%. Once settled in chair returned to 2L and saturations were 90%. RN aware.     Follow Up Recommendations  Home health OT;Supervision/Assistance - 24 hour    Equipment Recommendations  3 in 1 bedside commode    Recommendations for Other Services       Precautions / Restrictions Precautions Precautions: Fall Restrictions Weight Bearing Restrictions: No      Mobility Bed Mobility Overal bed mobility: Needs Assistance Bed Mobility: Supine to Sit     Supine to sit:  Supervision;HOB elevated     General bed mobility comments: use of bed rail and increased time  Transfers Overall transfer level: Needs assistance Equipment used: 1 person hand held assist;Rolling walker (2 wheeled) Transfers: Sit to/from Stand Sit to Stand: Min assist         General transfer comment: initially Pt was min A from bed with A for steady/balance and boost with HHA, from [facility] she was min A with vc for safety for use of RW but more balanced    Balance Overall balance assessment: Needs assistance Sitting-balance support: Feet supported Sitting balance-Leahy Scale: Good     Standing balance support: Single extremity supported;Bilateral upper extremity supported Standing balance-Leahy Scale: Fair Standing balance comment: mildly unsteady - reaches out for external support                           ADL either performed or assessed with clinical judgement   ADL Overall ADL's : Needs assistance/impaired Eating/Feeding: Modified independent Eating/Feeding Details (indicate cue type and reason): reports feeding herself spaghetti for lunch Grooming: Wash/dry hands;Wash/dry face;Oral care;Min guard;Standing Grooming Details (indicate cue type and reason): able to open packages with increased time, complete tasks with min cues Upper Body Bathing: Min guard;Sitting   Lower Body Bathing: Minimal assistance;Sitting/lateral leans Lower Body Bathing Details (indicate cue type and reason): cannot complete figure 4 for access to feet Upper Body Dressing : Set up   Lower Body Dressing: Minimal assistance Lower Body Dressing Details (indicate cue type and reason): assist to doff/don  sock at EOB - cannot complete figure 4 Toilet Transfer: Minimal assistance;RW;BSC;Ambulation Armed forces technical officer Details (indicate cue type and reason): min A for balance Toileting- Clothing Manipulation and Hygiene: Set up;Sitting/lateral lean Toileting - Clothing Manipulation Details (indicate  cue type and reason): able to perform front and back peri care     Functional mobility during ADLs: Minimal assistance;Cueing for safety;Rolling walker(initially HHA, better with RW) General ADL Comments: decreased cognition, edema in BLE impacting access for LB ADL, decreased safety awareness     Vision Baseline Vision/History: Wears glasses Wears Glasses: Reading only Patient Visual Report: No change from baseline Additional Comments: glasses not in room - left at home - Pt aware that she is missing them     Perception     Praxis      Pertinent Vitals/Pain Pain Assessment: 0-10 Pain Score: 4  Pain Location: BLE Pain Descriptors / Indicators: Discomfort;Sore Pain Intervention(s): Limited activity within patient's tolerance     Hand Dominance Right   Extremity/Trunk Assessment Upper Extremity Assessment Upper Extremity Assessment: Generalized weakness   Lower Extremity Assessment Lower Extremity Assessment: Defer to PT evaluation   Cervical / Trunk Assessment Cervical / Trunk Assessment: Normal   Communication Communication Communication: Expressive difficulties(word finding difficulties)   Cognition Arousal/Alertness: Awake/alert Behavior During Therapy: WFL for tasks assessed/performed Overall Cognitive Status: Impaired/Different from baseline Area of Impairment: Memory;Attention;Safety/judgement;Awareness;Problem solving                   Current Attention Level: Sustained Memory: Decreased short-term memory Following Commands: Follows one step commands consistently;Follows one step commands with increased time Safety/Judgement: Decreased awareness of deficits;Decreased awareness of safety Awareness: Emergent Problem Solving: Slow processing;Difficulty sequencing;Requires verbal cues General Comments: Pt thought that cars were driving outside on the roof of the building, confusion as to why she was at the hospital, able to complete simple and familiar  tasks with cues   General Comments  spoke with husband [name] on the phone for approx 25 min. He is a Curator, and is available 24 hours at dc. He provided PLOF and home information    Exercises     Shoulder Instructions      Home Living Family/patient expects to be discharged to:: Private residence Living Arrangements: Spouse/significant other Available Help at Discharge: Family;Available 24 hours/day Type of Home: House Home Access: Stairs to enter [employer] of Steps: 6 Entrance Stairs-Rails: Right Home Layout: One level     Bathroom Shower/Tub: Walk-in shower;Tub/shower unit(shower is very small - no room for seat)   Bathroom Toilet: Standard Bathroom Accessibility: No   Home Equipment: None   Additional Comments: confirmed home set up with husband - [name]      Prior Functioning/Environment Level of Independence: Independent        Comments: drives, retired from the state, independent typically        OT Problem List: Decreased strength;Decreased activity tolerance;Impaired balance (sitting and/or standing);Decreased cognition;Decreased safety awareness;Decreased knowledge of use of DME or AE;Cardiopulmonary status limiting activity;Increased edema      OT Treatment/Interventions: Self-care/ADL training;Energy conservation;DME and/or AE instruction;Therapeutic activities;Cognitive remediation/compensation;Patient/family education;Balance training    OT Goals(Current goals can be found in the care plan section) Acute Rehab OT Goals Patient Stated Goal: "to be able to make a cup of coffee" OT Goal Formulation: With patient Time For Goal Achievement: 10/14/18 Potential to Achieve Goals: Good ADL Goals Pt Will Perform Grooming: with supervision;standing Pt Will Perform Lower Body Dressing: with supervision;sit to/from stand Pt Will Transfer to Toilet:  with supervision;ambulating Pt Will Perform Toileting - Clothing Manipulation and hygiene: with modified  independence;sitting/lateral leans Pt Will Perform Tub/Shower Transfer: Tub transfer;with min guard assist;ambulating;3 in 1 Additional ADL Goal #1: Pt will increase use of learned compensatory strategies for memory with accuracy of 75%  OT Frequency: Min 3X/week   Barriers to D/C:            Co-evaluation              AM-PAC OT "6 Clicks" Daily Activity     Outcome Measure Help from another person eating meals?: None Help from another person taking care of personal grooming?: A Little Help from another person toileting, which includes using toliet, bedpan, or urinal?: A Little Help from another person bathing (including washing, rinsing, drying)?: A Little Help from another person to put on and taking off regular upper body clothing?: A Little Help from another person to put on and taking off regular lower body clothing?: A Little 6 Click Score: 19   End of Session Equipment Utilized During Treatment: Gait belt;Rolling walker;Oxygen(2L at rest, 4L with in room mobility) Nurse Communication: Mobility status;Other (comment)(emptied 200 units of urine)  Activity Tolerance: Patient tolerated treatment well Patient left: in chair;with call bell/phone within reach;with chair alarm set  OT Visit Diagnosis: Unsteadiness on feet (R26.81);Other abnormalities of gait and mobility (R26.89);Muscle weakness (generalized) (M62.81);Other symptoms and signs involving cognitive function                Time: 2575-0518 OT Time Calculation (min): 31 min Charges:  OT General Charges $OT Visit: 1 Visit OT Evaluation $OT Eval Moderate Complexity: 1 Mod OT Treatments $Self Care/Home Management : 8-22 mins  Hulda Humphrey OTR/L Acute Rehabilitation Services Pager: [phone] Office: [place] 09/30/2018, 4:19 PM

## 2018-10-01 LAB — CBC
HCT: 39 % (ref 36.0–46.0)
Hemoglobin: 12.3 g/dL (ref 12.0–15.0)
MCH: 26.8 pg (ref 26.0–34.0)
MCHC: 31.5 g/dL (ref 30.0–36.0)
MCV: 85 fL (ref 80.0–100.0)
Platelets: 254 10*3/uL (ref 150–400)
RBC: 4.59 MIL/uL (ref 3.87–5.11)
RDW: 14.8 % (ref 11.5–15.5)
WBC: 17 10*3/uL — ABNORMAL HIGH (ref 4.0–10.5)
nRBC: 0 % (ref 0.0–0.2)

## 2018-10-01 LAB — MAGNESIUM: Magnesium: 2.1 mg/dL (ref 1.7–2.4)

## 2018-10-01 LAB — BASIC METABOLIC PANEL
Anion gap: 9 (ref 5–15)
BUN: 24 mg/dL — ABNORMAL HIGH (ref 8–23)
CO2: 31 mmol/L (ref 22–32)
Calcium: 8.3 mg/dL — ABNORMAL LOW (ref 8.9–10.3)
Chloride: 92 mmol/L — ABNORMAL LOW (ref 98–111)
Creatinine, Ser: 0.54 mg/dL (ref 0.44–1.00)
GFR calc Af Amer: >60 mL/min (ref >60)
GFR calc non Af Amer: >60 mL/min (ref >60)
Glucose, Bld: 93 mg/dL (ref 70–99)
Potassium: 4 mmol/L (ref 3.5–5.1)
Sodium: 132 mmol/L — ABNORMAL LOW (ref 135–145)

## 2018-10-01 LAB — PHOSPHORUS: Phosphorus: 2.7 mg/dL (ref 2.5–4.6)

## 2018-10-01 MED ORDER — MOMETASONE FURO-FORMOTEROL FUM 100-5 MCG/ACT IN AERO
2.0000 | INHALATION_SPRAY | Freq: Two times a day (BID) | RESPIRATORY_TRACT | Status: DC
  Administered 2018-10-01 – 2018-10-06 (×11): 2 via RESPIRATORY_TRACT
  Filled 2018-10-01: qty 8.8

## 2018-10-01 MED ORDER — ALBUTEROL SULFATE HFA 108 (90 BASE) MCG/ACT IN AERS
2.0000 | INHALATION_SPRAY | Freq: Four times a day (QID) | RESPIRATORY_TRACT | Status: DC | PRN
  Filled 2018-10-01: qty 6.7

## 2018-10-01 MED ORDER — MAGNESIUM OXIDE 400 (241.3 MG) MG PO TABS
400.0000 mg | ORAL_TABLET | Freq: Two times a day (BID) | ORAL | Status: DC
  Administered 2018-10-01 – 2018-10-06 (×11): 400 mg via ORAL
  Filled 2018-10-01 (×11): qty 1

## 2018-10-01 MED ORDER — NICOTINE 14 MG/24HR TD PT24
14.0000 mg | MEDICATED_PATCH | TRANSDERMAL | Status: DC
  Administered 2018-10-01 – 2018-10-06 (×6): 14 mg via TRANSDERMAL
  Filled 2018-10-01 (×6): qty 1

## 2018-10-01 MED ORDER — PREDNISONE 20 MG PO TABS
40.0000 mg | ORAL_TABLET | Freq: Every day | ORAL | Status: AC
  Administered 2018-10-01 – 2018-10-05 (×5): 40 mg via ORAL
  Filled 2018-10-01 (×5): qty 2

## 2018-10-01 MED ORDER — FUROSEMIDE 20 MG PO TABS
20.0000 mg | ORAL_TABLET | ORAL | Status: DC
  Administered 2018-10-01: 20 mg via ORAL
  Filled 2018-10-01 (×2): qty 1

## 2018-10-01 NOTE — TOC Initial Note (Signed)
Transition of Care [facility]) - Initial/Assessment Note    Patient Details  Name: Evelyn Hughes MRN: [id number] Date of Birth: 10-04-1955  Transition of Care [facility]) CM/SW Contact:    Royston Bake, RN Phone Number: 10/01/2018, 8:37 AM  Clinical Narrative:                 10/01/2018 - Patient recently discharged home; Follow up with [facility] for primary care; lives with spouse; has private insurance with [place] with prescription drug coverage; has [facility] Lemm[facility] for [facility] services as prior to admission; home oxygen through [employer]; CM will continue to follow for progression of care.  Expected Discharge Plan: [place] Barriers to Discharge: No Barriers Identified   Patient Goals and CMS Choice Patient states their goals for this hospitalization and ongoing recovery are:: to stay at home CMS Medicare.gov Compare Post Acute Care list provided to:: Patient    Expected Discharge Plan and Services Expected Discharge Plan: [place] In-house Referral: NA Discharge Planning Services: CM Consult Post Acute Care Choice: Resumption of Svcs/PTA Provider, Home Health, Durable Medical Equipment Living arrangements for the past 2 months: Single Family Home Expected Discharge Date: 10/04/18               DME Arranged: N/A           HH Agency: [facility](HHC as prior to admission)        Prior Living Arrangements/Services Living arrangements for the past 2 months: Single Family Home Lives with:: Spouse          Need for Family Participation in Patient Care: Yes (Comment) Care giver support system in place?: Yes (comment) Current home services: Home RN    Activities of Daily Living      Permission Sought/Granted                  Emotional Assessment         Alcohol / Substance Use: Not Applicable Psych Involvement: No (comment)  Admission diagnosis:  Acute respiratory failure with hypoxia and  hypercapnia ([place]) [J96.01, J96.02] Patient Active Problem List   Diagnosis Date Noted  . Pressure injury of skin 09/28/2018  . Respiratory failure with hypoxia and hypercapnia ([place]) 09/27/2018  . Stage I pressure ulcer of left buttock 09/23/2018  . Pleural effusion on right   . Other secondary pulmonary hypertension ([place])   . Acute renal failure ([place])   . Acute respiratory failure with hypoxia and hypercapnia ([place]) 09/21/2018  . Acute respiratory failure ([place]) 09/21/2018   PCP:  Patient, No Pcp Per Pharmacy:   [facility] [place] [address] Phone: [phone] Fax: [phone]  [facility] [phone] [place], [place] [address] [place] [address] Phone: [phone] Fax: [phone]     Social Determinants of Health ([place]) Interventions    Readmission Risk Interventions No flowsheet data found.

## 2018-10-01 NOTE — Progress Notes (Addendum)
PROGRESS NOTE  Dayelin Balducci Nola CXK:[id number] DOB: March 03, 1956 DOA: 09/27/2018 PCP: Patient, No Pcp Per  HPI/Recap of past 24 hours: The patient is an ill-appearing 63 year old female, current every day smoker recently admitted to the hospital ( 5/4-5/8)  and ultimately diagnosed with a combination of what was likely cor pulmonale and acute hypoxic respiratory distress and failure requiring intubation. She self extubated and was ultimately placed on nasal cannula and discharged home with oxygen. Per the husband's report and per EMS report the patient has had a gradual decline especially over the last 12 to 24 hours refusing to eat or drink with AMS. Husband called 51, Paramedics found the patient to be hypoxic, Increased her oxygen and transported her to the ED. She was intubated within 30 minutes of arrival. PCCM have been asked to admit to ICU  and manage care.  TR[facility] assumed care on 09/30/2018.  10/01/18: Patient seen and examined at her bedside.  She is alert but confused.  She denies any chest pain or dyspnea at rest.  Persistent bilateral lower extremity edema, restarted Lasix at low dose due to soft blood pressures.  Assessment/Plan: Active Problems:   Respiratory failure with hypoxia and hypercapnia (HCC)   Pressure injury of skin  Resolving acute hypoxic hypercarbic respiratory failure, multifactorial secondary to acute volume overload, bilateral pleural effusion, COPD exacerbation Extubated on 09/29/2018 Currently on 2 L of oxygen by nasal cannula and saturating 97% Wean off Solu-Medrol Continue prednisone 40 mg daily With complete 5 days of IV cefepime today Continue to closely monitor oxygen saturation Home O2 evaluation ordered for discharge planning  Leukocytosis in the setting of recent CAP and steroid use WBC is trending down from 25K to 17K Afebrile with no sign of active infective process Weaning off steroids  Hypervolemic hyponatremia Sodium level improving 132 from  129 Continue fluid restriction  Hypomagnesemia Magnesium 1.5 Repleted Continue magnesium supplement  Chronic diastolic CHF Last 2D echo done on 09/26/2018 revealed preserved LVEF 55 to 60% Continue Lasix Continue strict I's and O's and daily weight -430 cc since admission  COPD exacerbation Weaned off Solu-Medrol Continue prednisone Continue breathing treatments and O2 supplementation Home O2 evaluation prior to discharge  Tobacco use disorder Counseled on tobacco cessation at bedside Continue nicotine patch as needed   Code Status: Full  Family Communication: Will call to update over the phone  Disposition Plan: Possible discharge tomorrow 10/01/2018   Consultants:  PCCM  Procedures:  Extubation on 09/29/2018  Antimicrobials: Cefepime  DVT prophylaxis: Subcu heparin 3 times daily   Objective: Vitals:   10/01/18 0445 10/01/18 0820 10/01/18 0845 10/01/18 1140  BP: 107/67 106/70 117/66 99/66  Pulse: 87 90 90 97  Resp: 18 18  20   Temp: 98.1 F (36.7 C) 98 F (36.7 C)  98.5 F (36.9 C)  TempSrc: Oral Oral  Oral  SpO2: 95% 92%  94%  Weight: 52 kg     Height:        Intake/Output Summary (Last 24 hours) at 10/01/2018 1359 Last data filed at 10/01/2018 0848 Gross per 24 hour  Intake 1479.76 ml  Output 1300 ml  Net 179.76 ml   Filed Weights   09/29/18 1831 09/30/18 0442 10/01/18 0445  Weight: 51.3 kg 51.5 kg 52 kg    Exam:  . General: 63 y.o. year-old female well-developed well-nourished no acute distress.  Alert and somewhat confused. . Cardiovascular: Regular rate and rhythm with no rubs or gallops.  No JVD or thyromegaly noted. Marland Kitchen Respiratory:  Mild rales at bases with no wheezes noted.  Good inspiratory effort. . Abdomen: Soft nontender nondistended with normal bowel sounds x4 quadrants. . Musculoskeletal: 2+ pitting edema in lower extremities bilaterally. Marland Kitchen Psychiatry: Mood is appropriate for condition and setting   Data Reviewed: CBC:  Recent Labs  Lab 09/27/18 0718  09/27/18 1036  09/28/18 0412 09/28/18 1126 09/29/18 0248 09/30/18 0332 10/01/18 0556  WBC 14.7*  --  13.8*  --  26.1*  --  30.2* 25.0* 17.0*  NEUTROABS 12.1*  --   --   --   --   --   --   --   --   HGB 14.2   < > 13.6   < > 14.0 15.0 12.8 12.6 12.3  HCT 50.3*   < > 44.2   < > 45.2 44.0 40.5 38.5 39.0  MCV 94.0  --  92.5  --  86.4  --  85.6 84.2 85.0  PLT 216  --  207  --  246  --  257 249 254   < > = values in this interval not displayed.   Basic Metabolic Panel: Recent Labs  Lab 09/27/18 1036  09/28/18 0412 09/28/18 1126 09/29/18 0248 09/30/18 0332 10/01/18 0556  NA 130*   < > 136 134* 134* 129* 132*  K 5.3*   < > 4.8 4.5 4.0 4.1 4.0  CL 83*  --  88*  --  87* 86* 92*  CO2 30  --  35*  --  32 34* 31  GLUCOSE 122*  --  134*  --  133* 107* 93  BUN 21  --  27*  --  36* 29* 24*  CREATININE 0.47  --  0.71  --  0.54 0.58 0.54  CALCIUM 8.3*  --  9.1  --  8.5* 8.5* 8.3*  MG 2.2  --  1.9  --  1.7 1.5* 2.1  PHOS 2.3*  --  2.1*  --  3.5 3.2 2.7   < > = values in this interval not displayed.   GFR: Estimated Creatinine Clearance: 59.9 mL/min (by C-G formula based on SCr of 0.54 mg/dL). Liver Function Tests: Recent Labs  Lab 09/25/18 0313 09/27/18 0718 09/27/18 1036 09/30/18 0332  AST 153* 68* 90*  --   ALT 386* 263* 234*  --   ALKPHOS 54 68 70  --   BILITOT 1.1 1.1 2.5*  --   PROT 4.4* 5.2* RESULTS UNAVAILABLE DUE TO INTERFERING SUBSTANCE  --   ALBUMIN 2.6* 2.9* 2.7* 2.5*   No results for input(s): LIPASE, AMYLASE in the last 168 hours. No results for input(s): AMMONIA in the last 168 hours. Coagulation Profile: Recent Labs  Lab 09/27/18 1036  INR 1.0   Cardiac Enzymes: Recent Labs  Lab 09/27/18 0718 09/28/18 0412  TROPONINI <0.03 <0.03   BNP (last 3 results) No results for input(s): PROBNP in the last 8760 hours. HbA1C: No results for input(s): HGBA1C in the last 72 hours. CBG: Recent Labs  Lab 09/29/18 0011 09/29/18  0411 09/29/18 0812 09/29/18 1159 09/29/18 1554  GLUCAP 119* 133* 116* 108* 122*   Lipid Profile: No results for input(s): CHOL, HDL, LDLCALC, TRIG, CHOLHDL, LDLDIRECT in the last 72 hours. Thyroid Function Tests: No results for input(s): TSH, T4TOTAL, FREET4, T3FREE, THYROIDAB in the last 72 hours. Anemia Panel: No results for input(s): VITAMINB12, FOLATE, FERRITIN, TIBC, IRON, RETICCTPCT in the last 72 hours. Urine analysis:    Component Value Date/Time   COLORURINE AMBER (A)  09/27/2018 0820   APPEARANCEUR CLEAR 09/27/2018 0820   LABSPEC 1.019 09/27/2018 0820   PHURINE 6.0 09/27/2018 0820   GLUCOSEU NEGATIVE 09/27/2018 0820   HGBUR NEGATIVE 09/27/2018 0820   BILIRUBINUR NEGATIVE 09/27/2018 0820   KETONESUR 5 (A) 09/27/2018 0820   PROTEINUR 30 (A) 09/27/2018 0820   NITRITE NEGATIVE 09/27/2018 0820   LEUKOCYTESUR NEGATIVE 09/27/2018 0820   Sepsis Labs: @LABRCNTIP (procalcitonin:4,lacticidven:4)  ) Recent Results (from the past 240 hour(s))  MRSA PCR Screening     Status: None   Collection Time: 09/21/18  9:43 PM  Result Value Ref Range Status   MRSA by PCR NEGATIVE NEGATIVE Final    Comment:        The GeneXpert MRSA Assay (FDA approved for NASAL specimens only), is one component of a comprehensive MRSA colonization surveillance program. It is not intended to diagnose MRSA infection nor to guide or monitor treatment for MRSA infections. Performed at [facility], [place] [address], [place], [place] [address]   SARS Coronavirus 2 (CEPHEID - Performed in [place] hospital lab), Hosp Order     Status: None   Collection Time: 09/27/18  7:00 AM  Result Value Ref Range Status   SARS Coronavirus 2 NEGATIVE NEGATIVE Final    Comment: (NOTE) If result is NEGATIVE SARS-CoV-2 target nucleic acids are NOT DETECTED. The SARS-CoV-2 RNA is generally detectable in upper and lower  respiratory specimens during the acute phase of infection. The lowest  concentration of  SARS-CoV-2 viral copies this assay can detect is 250  copies / mL. A negative result does not preclude SARS-CoV-2 infection  and should not be used as the sole basis for treatment or other  patient management decisions.  A negative result may occur with  improper specimen collection / handling, submission of specimen other  than nasopharyngeal swab, presence of viral mutation(s) within the  areas targeted by this assay, and inadequate number of viral copies  (<250 copies / mL). A negative result must be combined with clinical  observations, patient history, and epidemiological information. If result is POSITIVE SARS-CoV-2 target nucleic acids are DETECTED. The SARS-CoV-2 RNA is generally detectable in upper and lower  respiratory specimens dur ing the acute phase of infection.  Positive  results are indicative of active infection with SARS-CoV-2.  Clinical  correlation with patient history and other diagnostic information is  necessary to determine patient infection status.  Positive results do  not rule out bacterial infection or co-infection with other viruses. If result is PRESUMPTIVE POSTIVE SARS-CoV-2 nucleic acids MAY BE PRESENT.   A presumptive positive result was obtained on the submitted specimen  and confirmed on repeat testing.  While 2019 novel coronavirus  (SARS-CoV-2) nucleic acids may be present in the submitted sample  additional confirmatory testing may be necessary for epidemiological  and / or clinical management purposes  to differentiate between  SARS-CoV-2 and other Sarbecovirus currently known to infect humans.  If clinically indicated additional testing with an alternate test  methodology [phone]) is advised. The SARS-CoV-2 RNA is generally  detectable in upper and lower respiratory sp ecimens during the acute  phase of infection. The expected result is Negative. Fact Sheet for Patients:  StrictlyIdeas.no Fact Sheet for Healthcare  Providers: BankingDealers.co.za This test is not yet approved or cleared by the [place] FDA and has been authorized for detection and/or diagnosis of SARS-CoV-2 by FDA under an Emergency Use Authorization (EUA).  This EUA will remain in effect (meaning this test can be  used) for the duration of the COVID-19 declaration under Section 564(b)(1) of the Act, 21 U.S.C. section 360bbb-3(b)(1), unless the authorization is terminated or revoked sooner. Performed at [facility], [place] [address], [place], [place] [address]   Blood Culture (routine x 2)     Status: None (Preliminary result)   Collection Time: 09/27/18  7:21 AM  Result Value Ref Range Status   Specimen Description BLOOD RIGHT HAND  Final   Special Requests   Final    BOTTLES DRAWN AEROBIC AND ANAEROBIC Blood Culture adequate volume   Culture   Final    NO GROWTH 4 DAYS Performed at [facility], [place] [address], [place], [place] [address]    Report Status PENDING  Incomplete  Blood Culture (routine x 2)     Status: None (Preliminary result)   Collection Time: 09/27/18  7:26 AM  Result Value Ref Range Status   Specimen Description BLOOD LEFT HAND  Final   Special Requests   Final    BOTTLES DRAWN AEROBIC AND ANAEROBIC Blood Culture adequate volume   Culture   Final    NO GROWTH 4 DAYS Performed at [facility], [place] [address] [place], [place] [address]    Report Status PENDING  Incomplete  Culture, respiratory (tracheal aspirate)     Status: None   Collection Time: 09/27/18 10:02 AM  Result Value Ref Range Status   Specimen Description TRACHEAL ASPIRATE  Final   Special Requests Normal  Final   Gram Stain   Final    FEW WBC PRESENT, PREDOMINANTLY PMN RARE GRAM POSITIVE COCCI IN PAIRS    Culture   Final    RARE Consistent with normal respiratory flora. Performed at [facility], [place] [address], [place], [place] [address]    Report Status 09/30/2018 FINAL  Final   MRSA PCR Screening     Status: None   Collection Time: 09/27/18 11:48 AM  Result Value Ref Range Status   MRSA by PCR NEGATIVE NEGATIVE Final    Comment:        The GeneXpert MRSA Assay (FDA approved for NASAL specimens only), is one component of a comprehensive MRSA colonization surveillance program. It is not intended to diagnose MRSA infection nor to guide or monitor treatment for MRSA infections. Performed at [facility], [place] [address] [place], [place] [address]   Culture, Urine     Status: None   Collection Time: 09/27/18  5:13 PM  Result Value Ref Range Status   Specimen Description URINE, CLEAN CATCH  Final   Special Requests NONE  Final   Culture   Final    NO GROWTH Performed at [facility], [place] [address], [place], [place] [address]    Report Status 09/28/2018 FINAL  Final      Studies: No results found.  Scheduled Meds: . Chlorhexidine Gluconate Cloth  6 each Topical Daily  . feeding supplement (ENSURE ENLIVE)  237 mL Oral BID BM  . furosemide  20 mg Oral QODAY  . heparin  5,000 Units Subcutaneous Q8H  . ipratropium-albuterol  3 mL Nebulization TID  . magnesium oxide  400 mg Oral BID  . mometasone-formoterol  2 puff Inhalation BID  . multivitamin with minerals  1 tablet Oral Daily  . nicotine  14 mg Transdermal Q24H  . predniSONE  40 mg Oral Q breakfast    Continuous Infusions: . sodium chloride    . sodium chloride 250  mL (09/30/18 0016)  . ceFEPime (MAXIPIME) IV 2 g (10/01/18 1303)     LOS: 4 days     Kayleen Memos, MD [facility] Pager [phone]  If 7PM-7AM, please contact night-coverage [email or web address] Password [facility] 10/01/2018, 1:59 PM

## 2018-10-01 NOTE — Plan of Care (Signed)
  Problem: Activity: Goal: Risk for activity intolerance will decrease Outcome: Progressing   Problem: Safety: Goal: Ability to remain free from injury will improve Outcome: Progressing   

## 2018-10-01 NOTE — Progress Notes (Signed)
Occupational Therapy Treatment Patient Details Name: Evelyn Hughes MRN: [id number] DOB: 17-Apr-1956 Today's Date: 10/01/2018    History of present illness Pt is 63 yo female, recently discharged for CHF, COPD with h/o tobacco abuse who returns due to declining respiratory status. Required reintubation. Extubated 09/29/18.     OT comments  Pt progressing towards OT goals this session. She was able to perform bed mobility at supervision, transfers at min guard for SPT to [facility], and peri care with set up on [facility]. We also were able to brush her hair standing at the sink and get all the knots out of the back.   Of Note: Functional cognition further assessed with The Pillbox Test: A Measure of Executive Functioning and Estimate of Medication Management. A straight pass/fail designation is determined by 3 or more errors of omission or misplacement on the task. The pt completed the test with less than 3 errors. See cognition section below because while she was able to pass the pill box test and get all the pills in the correct place - it took her 28 minutes when it should have taken 5. Also she still presents with confusion.    Follow Up Recommendations  Home health OT;Supervision/Assistance - 24 hour    Equipment Recommendations  3 in 1 bedside commode    Recommendations for Other Services      Precautions / Restrictions Precautions Precautions: Fall Restrictions Weight Bearing Restrictions: No       Mobility Bed Mobility Overal bed mobility: Needs Assistance Bed Mobility: Supine to Sit;Sit to Supine     Supine to sit: Supervision Sit to supine: Supervision   General bed mobility comments: use of bed rail and increased time  Transfers Overall transfer level: Needs assistance Equipment used: None Transfers: Stand Pivot Transfers Sit to Stand: Min guard         General transfer comment: min guard for safety for SPT right next to bed    Balance Overall balance assessment: Needs  assistance Sitting-balance support: Feet supported Sitting balance-Leahy Scale: Good Sitting balance - Comments: able to sit EOB for pill box test   Standing balance support: Single extremity supported;Bilateral upper extremity supported Standing balance-Leahy Scale: Fair Standing balance comment: mildly unsteady - reaches out for external support                           ADL either performed or assessed with clinical judgement   ADL Overall ADL's : Needs assistance/impaired     Grooming: Brushing hair;Min guard;Standing Grooming Details (indicate cue type and reason): able to get all knots out of the back of her hair with non-rinse spray                 Toilet Transfer: Min Statistician Details (indicate cue type and reason): min guard for safety Toileting- Clothing Manipulation and Hygiene: Set up;Sitting/lateral lean Toileting - Clothing Manipulation Details (indicate cue type and reason): able to perform front and back peri care       General ADL Comments: confusion continues - Pt getting frustrated by decreased cognition "I know I am not supposed to be like this"     Vision   Additional Comments: glasses not in room, able to read parts of pill box test with magnifying glass   Perception     Praxis      Cognition Arousal/Alertness: Awake/alert Behavior During Therapy: WFL for tasks assessed/performed Overall Cognitive Status: Impaired/Different from baseline  Area of Impairment: Memory;Attention;Safety/judgement;Awareness;Problem solving                   Current Attention Level: Sustained Memory: Decreased short-term memory Following Commands: Follows one step commands consistently;Follows one step commands with increased time Safety/Judgement: Decreased awareness of deficits;Decreased awareness of safety Awareness: Emergent Problem Solving: Slow processing;Difficulty sequencing;Requires verbal cues General  Comments: Improved cognition since yesterday. Today she was able to pass the pill box test, but said "when people ask me the month, I don't know what it is, I know it is 2020 - but I don't know what 2020 means" Then she told me that it is May and that May is the 7th month of the year. She could tell me correctly that April was before May and June was after. Everything takes increased time and she talks herself through the whole thing. "I just can't figure out what is going on in my brain" I am NOT usually like this.        Exercises     Shoulder Instructions       General Comments VSS    Pertinent Vitals/ Pain       Pain Assessment: 0-10 Pain Score: 3  Pain Location: BLE Pain Descriptors / Indicators: Discomfort;Sore Pain Intervention(s): Limited activity within patient's tolerance;Monitored during session;Repositioned  Home Living                                          Prior Functioning/Environment              Frequency  Min 3X/week        Progress Toward Goals  OT Goals(current goals can now be found in the care plan section)  Progress towards OT goals: Progressing toward goals  Acute Rehab OT Goals Patient Stated Goal: "to be able to make a cup of coffee" OT Goal Formulation: With patient Time For Goal Achievement: 10/14/18 Potential to Achieve Goals: Good  Plan Discharge plan remains appropriate;Frequency remains appropriate    Co-evaluation                 AM-PAC OT "6 Clicks" Daily Activity     Outcome Measure   Help from another person eating meals?: None Help from another person taking care of personal grooming?: A Little Help from another person toileting, which includes using toliet, bedpan, or urinal?: A Little Help from another person bathing (including washing, rinsing, drying)?: A Little Help from another person to put on and taking off regular upper body clothing?: A Little Help from another person to put on and  taking off regular lower body clothing?: A Little 6 Click Score: 19    End of Session Equipment Utilized During Treatment: Oxygen  OT Visit Diagnosis: Unsteadiness on feet (R26.81);Other abnormalities of gait and mobility (R26.89);Muscle weakness (generalized) (M62.81);Other symptoms and signs involving cognitive function   Activity Tolerance Patient tolerated treatment well   Patient Left in bed;with call bell/phone within reach;with bed alarm set   Nurse Communication Mobility status        Time: 3710-6269 OT Time Calculation (min): 43 min  Charges: OT General Charges $OT Visit: 1 Visit OT Treatments $Self Care/Home Management : 8-22 mins $Cognitive Funtion inital: Initial 15 mins $Cognitive Funtion additional: Additional15 mins  Hulda Humphrey OTR/L Acute Rehabilitation Services Pager: [phone] Office: [place] 10/01/2018, 3:58 PM

## 2018-10-02 DIAGNOSIS — J9602 Acute respiratory failure with hypercapnia: Secondary | ICD-10-CM

## 2018-10-02 DIAGNOSIS — J9601 Acute respiratory failure with hypoxia: Principal | ICD-10-CM

## 2018-10-02 DIAGNOSIS — I2721 Secondary pulmonary arterial hypertension: Secondary | ICD-10-CM

## 2018-10-02 LAB — CBC
HCT: 40.6 % (ref 36.0–46.0)
Hemoglobin: 12.7 g/dL (ref 12.0–15.0)
MCH: 26.7 pg (ref 26.0–34.0)
MCHC: 31.3 g/dL (ref 30.0–36.0)
MCV: 85.5 fL (ref 80.0–100.0)
Platelets: 291 10*3/uL (ref 150–400)
RBC: 4.75 MIL/uL (ref 3.87–5.11)
RDW: 14.8 % (ref 11.5–15.5)
WBC: 17.9 10*3/uL — ABNORMAL HIGH (ref 4.0–10.5)
nRBC: 0 % (ref 0.0–0.2)

## 2018-10-02 LAB — BASIC METABOLIC PANEL
Anion gap: 8 (ref 5–15)
BUN: 21 mg/dL (ref 8–23)
CO2: 31 mmol/L (ref 22–32)
Calcium: 8.3 mg/dL — ABNORMAL LOW (ref 8.9–10.3)
Chloride: 92 mmol/L — ABNORMAL LOW (ref 98–111)
Creatinine, Ser: 0.57 mg/dL (ref 0.44–1.00)
Glucose, Bld: 81 mg/dL (ref 70–99)
Potassium: 4.2 mmol/L (ref 3.5–5.1)
Sodium: 131 mmol/L — ABNORMAL LOW (ref 135–145)

## 2018-10-02 LAB — CULTURE, BLOOD (ROUTINE X 2)
Culture: NO GROWTH
Culture: NO GROWTH
Special Requests: ADEQUATE
Special Requests: ADEQUATE

## 2018-10-02 LAB — TSH: TSH: 4.123 u[IU]/mL (ref 0.350–4.500)

## 2018-10-02 MED ORDER — SPIRONOLACTONE 12.5 MG HALF TABLET
12.5000 mg | ORAL_TABLET | Freq: Every day | ORAL | Status: DC
  Administered 2018-10-02 – 2018-10-04 (×3): 12.5 mg via ORAL
  Filled 2018-10-02 (×3): qty 1

## 2018-10-02 MED ORDER — FUROSEMIDE 10 MG/ML IJ SOLN
40.0000 mg | Freq: Two times a day (BID) | INTRAMUSCULAR | Status: DC
  Administered 2018-10-02 – 2018-10-04 (×4): 40 mg via INTRAVENOUS
  Filled 2018-10-02 (×4): qty 4

## 2018-10-02 MED ORDER — FUROSEMIDE 10 MG/ML IJ SOLN
20.0000 mg | Freq: Once | INTRAMUSCULAR | Status: AC
  Administered 2018-10-02: 20 mg via INTRAVENOUS
  Filled 2018-10-02: qty 2

## 2018-10-02 NOTE — Consult Note (Signed)
Cardiology Consultation:   Patient ID: Evelyn Hughes; [id number]; 03-May-1956   Admit date: 09/27/2018 Date of Consult: 10/02/2018  Primary Care Provider: Patient, No Pcp Per Primary Cardiologist: New   Patient Profile:   Evelyn Hughes is a 63 y.o. female with a hx of tobacco use, who is being seen today for the evaluation of pulmonary HTN and fluid volume overload at the request of Dr. Nevada Crane.  History of Present Illness:   Evelyn Hughes is a 20 yoF with a hx as stated above who presented to [facility] on 09/27/2018 with AMS found to be hypoxic on arrival requiring intubation. She was recently admitted from 09/21/2018-09/25/2018 for similar symptoms found to have a combination of what was likely cor pulmonale and acute hypoxic respiratory distress and failure requiring intubation. She self extubated and was ultimately placed on nasal cannula and discharged home with oxygen. Per chart review, the patients husband reported a gradual decline since hospital discharge, more so over the 12-24h prior to admission. Given this, her husband called EMS for transport the the ED for further evaluation. On their arrival, she was found to be hypoxic and she was intubated withing 30 minutes of ED arrival.   In the ED, she was given 1 IVF and propofol was initiated. BNP was found to be greater than 1800. EKG with sinus tachycardia and evidence of right atrial enlargement, Q waves in lateral leads and borderline ST elevation. Troponin was negative at <0.03. She had a mild fever at 99.3. CXR was notable for hyperinflation, emphysema, small bilateral pleural effusions and bibasilar atelectasis versus infiltrate. COVID-19 negative.    Pt was successfully extubated on 09/29/2018 and transferred to regular floor. Internal Medicine service presumed care on 09/30/2018. On 10/01/2018 she was found to be alert but more confused. She was started on Lasix due to BLE. She was been regularly followed by PT/OT with plans for home  health assistance at discharge   Denies any h/o known cardiac disease or HTN. Has not had CP or syncope.   Cardiology has been asked to evaluate for worsening HF symptoms.   Past Medical History:  Diagnosis Date  . Allergy     History reviewed. No pertinent surgical history.   Prior to Admission medications   Medication Sig Start Date End Date Taking? Authorizing Provider  albuterol (VENTOLIN HFA) 108 (90 Base) MCG/ACT inhaler Inhale 2 puffs into the lungs every 6 (six) hours as needed for wheezing or shortness of breath. 09/25/18   Pokhrel, Corrie Mckusick, MD  benzonatate (TESSALON) 100 MG capsule Take 1-2 capsules (100-200 mg total) by mouth 3 (three) times daily as needed. 03/23/14   Posey Boyer, MD  Fluticasone-Salmeterol (ADVAIR DISKUS) 100-50 MCG/DOSE AEPB Inhale 1 puff into the lungs 2 (two) times daily. 09/25/18 09/25/19  Pokhrel, Corrie Mckusick, MD  furosemide (LASIX) 20 MG tablet Take 1 tablet (20 mg total) by mouth every other day. 09/25/18 11/24/18  Pokhrel, Corrie Mckusick, MD  magnesium oxide (MAG-OX) 400 (241.3 Mg) MG tablet Take 1 tablet (400 mg total) by mouth 2 (two) times daily for 30 days. 09/25/18 10/25/18  Pokhrel, Corrie Mckusick, MD  nicotine (NICODERM CQ - DOSED IN MG/24 HOURS) 14 mg/24hr patch Place 1 patch (14 mg total) onto the skin daily. 09/25/18 09/25/19  Pokhrel, Corrie Mckusick, MD  potassium chloride SA (K-DUR) 20 MEQ tablet Take 1 tablet (20 mEq total) by mouth daily for 10 days. 09/25/18 10/05/18  Pokhrel, Corrie Mckusick, MD  tiotropium (SPIRIVA HANDIHALER) 18 MCG inhalation capsule Place 1 capsule (  18 mcg total) into inhaler and inhale daily for 30 days. 09/25/18 10/25/18  Pokhrel, Corrie Mckusick, MD   Inpatient Medications: Scheduled Meds: . feeding supplement (ENSURE ENLIVE)  237 mL Oral BID BM  . furosemide  20 mg Intravenous Once  . furosemide  20 mg Oral QODAY  . heparin  5,000 Units Subcutaneous Q8H  . ipratropium-albuterol  3 mL Nebulization TID  . magnesium oxide  400 mg Oral BID  . mometasone-formoterol  2 puff  Inhalation BID  . multivitamin with minerals  1 tablet Oral Daily  . nicotine  14 mg Transdermal Q24H  . predniSONE  40 mg Oral Q breakfast   Continuous Infusions: . sodium chloride    . sodium chloride 250 mL (10/01/18 2147)   PRN Meds: sodium chloride, acetaminophen, albuterol, ipratropium-albuterol, ondansetron (ZOFRAN) IV  Allergies:    Allergies  Allergen Reactions  . Sulfa Antibiotics   . Penicillins Rash    Social History:   Social History   Socioeconomic History  . Marital status: Married    Spouse name: Not on file  . Number of children: Not on file  . Years of education: Not on file  . Highest education level: Not on file  Occupational History  . Not on file  Social Needs  . Financial resource strain: Not on file  . Food insecurity:    Worry: Not on file    Inability: Not on file  . Transportation needs:    Medical: Not on file    Non-medical: Not on file  Tobacco Use  . Smoking status: Current Every Day Smoker  . Smokeless tobacco: Never Used  Substance and Sexual Activity  . Alcohol use: No    Alcohol/week: 0.0 standard drinks  . Drug use: No  . Sexual activity: Not on file  Lifestyle  . Physical activity:    Days per week: Not on file    Minutes per session: Not on file  . Stress: Not on file  Relationships  . Social connections:    Talks on phone: Not on file    Gets together: Not on file    Attends religious service: Not on file    Active member of club or organization: Not on file    Attends meetings of clubs or organizations: Not on file    Relationship status: Not on file  . Intimate partner violence:    Fear of current or ex partner: Not on file    Emotionally abused: Not on file    Physically abused: Not on file    Forced sexual activity: Not on file  Other Topics Concern  . Not on file  Social History Narrative  . Not on file    Family History:   Family History  Problem Relation Age of Onset  . Cancer Mother    Family  Status:  Family Status  Relation Name Status  . Mother  Deceased  . Father  Deceased  . MGM  Deceased  . MGF  Deceased  . PGM  Deceased  . PGF  Deceased   ROS:  Please see the history of present illness.  All other ROS reviewed and negative.     Physical Exam/Data:   Vitals:   10/02/18 0841 10/02/18 0846 10/02/18 0923 10/02/18 1122  BP:   118/79 110/62  Pulse: 96  100 88  Resp: 18   18  Temp:    98.8 F (37.1 C)  TempSrc:    Oral  SpO2: 94% 94% 94%  94%  Weight:      Height:        Intake/Output Summary (Last 24 hours) at 10/02/2018 1126 Last data filed at 10/02/2018 0837 Gross per 24 hour  Intake 1200 ml  Output 775 ml  Net 425 ml   Filed Weights   09/30/18 0442 10/01/18 0445 10/02/18 0409  Weight: 51.5 kg 52 kg 53.4 kg   Body mass index is 19.59 kg/m.   See MD exam   EKG:  The EKG was personally reviewed and demonstrates: 09/27/2018 NSR with evidence of right atrial enlargement RAD Telemetry:  Telemetry was personally reviewed and demonstrates:  NSR 80-90s Personally reviewed   Relevant CV Studies:  Echocardiogram 09/22/2018: 09/22/2018 Echo The left ventricle has normal systolic function, with an ejection fraction of 55-60%. The cavity size was normal. Left ventricular diastolic function could not be evaluated. The right ventricle has normal systolc function. The cavity was mildly enlarged. Right ventricular systolic pressure is mildly elevated with an estimated pressure of 46.6 mmHg. Right atrial size was mildly dilated. The mitral valve is grossly normal. The tricuspid valve was grossly normal. The aortic valve is tricuspid No stenosis of the aortic valve.  Limited study; normal LV function; mild RAE and RVE; moderate RV dysfunction; trace TR; mild pulmonary hypertension.  CATH: None   Laboratory Data:  Chemistry Recent Labs  Lab 09/30/18 0332 10/01/18 0556 10/02/18 0557  NA 129* 132* 131*  K 4.1 4.0 4.2  CL 86* 92* 92*  CO2 34* 31 31   GLUCOSE 107* 93 81  BUN 29* 24* 21  CREATININE 0.58 0.54 0.57  CALCIUM 8.5* 8.3* 8.3*  GFRNONAA >60 >60 NOT CALCULATED  GFRAA >60 >60 NOT CALCULATED  ANIONGAP 9 9 8     Total Protein  Date Value Ref Range Status  09/27/2018 RESULTS UNAVAILABLE DUE TO INTERFERING SUBSTANCE 6.5 - 8.1 g/dL Final   Albumin  Date Value Ref Range Status  09/30/2018 2.5 (L) 3.5 - 5.0 g/dL Final   AST  Date Value Ref Range Status  09/27/2018 90 (H) 15 - 41 U/L Final   ALT  Date Value Ref Range Status  09/27/2018 234 (H) 0 - 44 U/L Final   Alkaline Phosphatase  Date Value Ref Range Status  09/27/2018 70 38 - 126 U/L Final   Total Bilirubin  Date Value Ref Range Status  09/27/2018 2.5 (H) 0.3 - 1.2 mg/dL Final   Hematology Recent Labs  Lab 09/30/18 0332 10/01/18 0556 10/02/18 0557  WBC 25.0* 17.0* 17.9*  RBC 4.57 4.59 4.75  HGB 12.6 12.3 12.7  HCT 38.5 39.0 40.6  MCV 84.2 85.0 85.5  MCH 27.6 26.8 26.7  MCHC 32.7 31.5 31.3  RDW 14.8 14.8 14.8  PLT 249 254 291   Cardiac Enzymes Recent Labs  Lab 09/27/18 0718 09/28/18 0412  TROPONINI <0.03 <0.03   No results for input(s): TROPIPOC in the last 168 hours.  BNP Recent Labs  Lab 09/27/18 0718 09/28/18 0412  BNP 1,852.5* 230.6*    DDimer No results for input(s): DDIMER in the last 168 hours. TSH:  Lab Results  Component Value Date   TSH 4.123 10/02/2018   Lipids: Lab Results  Component Value Date   TRIG 91 09/22/2018   HgbA1c:No results found for: HGBA1C  Radiology/Studies:  Dg Chest Port 1 View  Result Date: 09/29/2018 CLINICAL DATA:  62 year old female with respiratory failure recently negative for COVID-19. EXAM: PORTABLE CHEST 1 VIEW COMPARISON:  09/28/2018 and earlier. FINDINGS: Portable AP semi upright  view at 0510 hours. ETT tip in good position between the clavicles and carina. Enteric tube courses to the left upper quadrant, tip not included. Stable mediastinal contours and large lung volumes. Mildly increased  veiling opacity at the left lung base. Stable ventilation and mild veiling opacity at the right base. No pneumothorax or pulmonary edema. IMPRESSION: 1. Stable lines and tubes. 2. Large lung volumes with stable right lower lung and new left lung base mild veiling opacity which might indicate pleural effusions. Electronically Signed   By: Genevie Ann M.D.   On: 09/29/2018 05:44   Assessment and Plan:   1. Acute hypoxic respiratory failure secondary to volume overload versus COPD exacerbation: -Pt presented with AMS found to be hypoxic on presentation. She was intubated and managed per PCCM and extubtaed 03/13/8526 without complications -An echocardiogram performed 09/22/2018 showed an LVEF of 55-60% with undetermined diastolic function. There was mild RAE and RVE; moderate RV dysfunction; trace TR; mild pulmonary hypertension with an estimated pressure of 46.6 mmHg  -BNP on presentation>>1852 on 09/27/2018  -Pt reports worsening LE swelling prior to admission>>>BLE ultrasound performed which was negative for DVT -Given a one time dose of IV Lasix 20mg  and started on 20mg  PO every other day which appears to be her home diuretic regimen  -Weight, 117lb today with an admission weight of 105lb  -I&O, net negative 6.43ml (? how correct are these I&O's) -Most recent CXR 09/29/2018 with with stable right lower lung and new left lung base mild veiling opacity which might indicate pleural effusions -Would start IV Lasix 40mg  BID if physical exam by MD consistent with fluid volume overload -Creatine stable at 0.57 -If no improvement after adequate diuresis, may need RHC to fully evaluate pressures as this is likely multifactorial with flud volume overload, COPD as well as pulmonary HTN.   2. COPD exacerbation with known emphysema: -Pt intubated on ED arrival for acute hypoxia and AMS. She is a known long term smoker with COPD -Extubated 09/29/2018 (had recent hospital admission 05/04-05/08 for similar respiratory  symptoms.  -On home inhalers and being treated with solumedrol 40mg  BID and cefepime per primary team  -Plan is for supplemental home O2 at discharge  -Per primary team 3. Tobacco use: -Smoking cessation strongly encouraged   4. Leukocytosis: -WBC, 17.9 today -Continue abx per primary team as above  -Procalcitonin, <0.10 on 09/29/2018    For questions or updates, please contact [facility] Please consult [email or web address] for contact info under Cardiology/STEMI.   Lyndel Safe NP-C [facility] Pager: [phone] 10/02/2018 11:26 AM   Patient seen and examined with the above-signed Advanced Practice Provider and/or Housestaff. I personally reviewed laboratory data, imaging studies and relevant notes. I independently examined the patient and formulated the important aspects of the plan. I have edited the note to reflect any of my changes or salient points. I have personally discussed the plan with the patient and/or family.  63 y/o woman without previous cardiac history. 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 Has severe COPD with 2-3ppd smoking history recently cut back due to progressive SOB.   Admitted recently with respiratory failure and intubated. Had recurrent respiratory failure and LE edema and readmitted.   Echo shows normal LV function (daistolic function not assessed) with severely dialted RV and moderate PAH. LE u/s negative for DVT  CXR with severe COPD  On exam Thin chronically ill appearing hoarse HEENT: normal Neck: supple. JVP 9-10  Carotids 2+ bilat; no bruits. No lymphadenopathy or thryomegaly  appreciated. Cor: PMI nondisplaced. Regular rate & rhythm. + RV lift soft TR Lungs: decreased BS throguhout Abdomen: soft, nontender, + distended. No hepatosplenomegaly. No bruits or masses. Good bowel sounds. Extremities: no cyanosis, clubbing, rash, 2+ edema Neuro: alert & orientedx3, cranial nerves grossly intact. moves all 4 extremities w/o difficulty. 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 She has significant LE edema.  Plan: 1. Check VQ 2. PFTs with DLCO 3. Place TED hose  4. Diurese 5. O2 supplementation to keep sats >- 90% 6. If not improving can proceed with RHC on Monday 7. No role for selective pulmonary artery vasodilators at this point.  8. Pulmoanry rehab on d/c  9. Stop smoking  Glori Bickers, MD  2:37 PM

## 2018-10-02 NOTE — Progress Notes (Signed)
PROGRESS NOTE  Evelyn Hughes XBJ:[id number] DOB: 1955/10/28 DOA: 09/27/2018 PCP: Patient, No Pcp Per  HPI/Recap of past 24 hours: The patient is an ill-appearing 63 year old female, current every day smoker recently admitted to the hospital ( 5/4-5/8)  and ultimately diagnosed with a combination of what was likely cor pulmonale and acute hypoxic respiratory distress and failure requiring intubation. She self extubated and was ultimately placed on nasal cannula and discharged home with oxygen. Per the husband's report and per EMS report the patient has had a gradual decline especially over the last 12 to 24 hours refusing to eat or drink with AMS. Husband called 38, Paramedics found the patient to be hypoxic, Increased her oxygen and transported her to the ED. She was intubated within 30 minutes of arrival. PCCM have been asked to admit to ICU  and manage care.  TR[facility] assumed care on 09/30/2018.  10/02/18: Patient seen and examined at her bedside.  No acute events overnight.  She is alert and oriented x3.  Reports heaviness in her lower extremities with no significant improvement.  Bilateral Doppler ultrasound done during this admission negative for DVT.  2D echo done with concern for diastolic CHF.  Cardiology consulted to assess.   Assessment/Plan: Active Problems:   Respiratory failure with hypoxia and hypercapnia (HCC)   Pressure injury of skin  Resolving acute hypoxic hypercarbic respiratory failure, multifactorial secondary to acute volume overload, bilateral pleural effusion, COPD exacerbation Extubated on 09/29/2018 Currently on 2 L of oxygen by nasal cannula and saturating 97% Wean off Solu-Medrol Continue prednisone 40 mg daily Completed 5 days of IV cefepime Home O2 evaluation for discharge planning  Newly diagnosed diastolic CHF 2D echo done on 09/22/2018 revealed preserved LVEF with moderate right ventricular dysfunction Cardiology consulted to further assess  Persistent  bilateral lower extremity 2+ pitting edema  Patient reports swelling worsening in weeks Bilateral Doppler ultrasound negative for DVT No obvious discoloration or hyperpigmentation Cardiology has been consulted  Emphysema COPD exacerbation Counseled on the importance of tobacco use cessation O2 supplementation as needed Continue inhalers Continue prednisone 40 mg daily  Leukocytosis in the setting of recent CAP and steroid use WBC is trending down from 25K to 17K Afebrile with no sign of active infective process Weaning off steroids Currently on prednisone 40 mg daily   Hypervolemic hyponatremia Sodium 131 Continue fluid restriction  Hypomagnesemia Magnesium 1.5 Repleted Continue magnesium supplement  Tobacco use disorder Counseled on tobacco cessation at bedside Continue nicotine patch as needed   Code Status: Full  Family Communication: Will call to update over the phone  Disposition Plan: Discharge to home with home health services when cardiology signs off.   Consultants:  PCCM  Cardiology  Procedures:  Extubation on 09/29/2018  Antimicrobials: Cefepime  DVT prophylaxis: Subcu heparin 3 times daily   Objective: Vitals:   10/02/18 0409 10/02/18 0841 10/02/18 0846 10/02/18 0923  BP:    118/79  Pulse:  96  100  Resp:  18    Temp:      TempSrc:      SpO2:  94% 94% 94%  Weight: 53.4 kg     Height:        Intake/Output Summary (Last 24 hours) at 10/02/2018 1033 Last data filed at 10/02/2018 0837 Gross per 24 hour  Intake 1200 ml  Output 775 ml  Net 425 ml   Filed Weights   09/30/18 0442 10/01/18 0445 10/02/18 0409  Weight: 51.5 kg 52 kg 53.4 kg  Exam:  . General: 63 y.o. year-old female well-developed well-nourished in no acute distress.  Alert and oriented x3.   . Cardiovascular: Regular rate and rhythm with no rubs or gallops.  No JVD or thyromegaly noted. Marland Kitchen Respiratory: Mild rales at bases with no wheezes noted.  Poor inspiratory  effort. . Abdomen: Soft nontender nondistended with normal bowel sounds x4 quadrants.   . Musculoskeletal: 2+ pitting edema in lower extremities bilaterally. Marland Kitchen Psychiatry: Mood is appropriate for condition and setting.   Data Reviewed: CBC: Recent Labs  Lab 09/27/18 0718  09/28/18 0412 09/28/18 1126 09/29/18 0248 09/30/18 0332 10/01/18 0556 10/02/18 0557  WBC 14.7*   < > 26.1*  --  30.2* 25.0* 17.0* 17.9*  NEUTROABS 12.1*  --   --   --   --   --   --   --   HGB 14.2   < > 14.0 15.0 12.8 12.6 12.3 12.7  HCT 50.3*   < > 45.2 44.0 40.5 38.5 39.0 40.6  MCV 94.0   < > 86.4  --  85.6 84.2 85.0 85.5  PLT 216   < > 246  --  257 249 254 291   < > = values in this interval not displayed.   Basic Metabolic Panel: Recent Labs  Lab 09/27/18 1036  09/28/18 0412 09/28/18 1126 09/29/18 0248 09/30/18 0332 10/01/18 0556 10/02/18 0557  NA 130*   < > 136 134* 134* 129* 132* 131*  K 5.3*   < > 4.8 4.5 4.0 4.1 4.0 4.2  CL 83*  --  88*  --  87* 86* 92* 92*  CO2 30  --  35*  --  32 34* 31 31  GLUCOSE 122*  --  134*  --  133* 107* 93 81  BUN 21  --  27*  --  36* 29* 24* 21  CREATININE 0.47  --  0.71  --  0.54 0.58 0.54 0.57  CALCIUM 8.3*  --  9.1  --  8.5* 8.5* 8.3* 8.3*  MG 2.2  --  1.9  --  1.7 1.5* 2.1  --   PHOS 2.3*  --  2.1*  --  3.5 3.2 2.7  --    < > = values in this interval not displayed.   GFR: Estimated Creatinine Clearance: 61.5 mL/min (by C-G formula based on SCr of 0.57 mg/dL). Liver Function Tests: Recent Labs  Lab 09/27/18 0718 09/27/18 1036 09/30/18 0332  AST 68* 90*  --   ALT 263* 234*  --   ALKPHOS 68 70  --   BILITOT 1.1 2.5*  --   PROT 5.2* RESULTS UNAVAILABLE DUE TO INTERFERING SUBSTANCE  --   ALBUMIN 2.9* 2.7* 2.5*   No results for input(s): LIPASE, AMYLASE in the last 168 hours. No results for input(s): AMMONIA in the last 168 hours. Coagulation Profile: Recent Labs  Lab 09/27/18 1036  INR 1.0   Cardiac Enzymes: Recent Labs  Lab 09/27/18 0718  09/28/18 0412  TROPONINI <0.03 <0.03   BNP (last 3 results) No results for input(s): PROBNP in the last 8760 hours. HbA1C: No results for input(s): HGBA1C in the last 72 hours. CBG: Recent Labs  Lab 09/29/18 0011 09/29/18 0411 09/29/18 0812 09/29/18 1159 09/29/18 1554  GLUCAP 119* 133* 116* 108* 122*   Lipid Profile: No results for input(s): CHOL, HDL, LDLCALC, TRIG, CHOLHDL, LDLDIRECT in the last 72 hours. Thyroid Function Tests: Recent Labs    10/02/18 0557  TSH 4.123  Anemia Panel: No results for input(s): VITAMINB12, FOLATE, FERRITIN, TIBC, IRON, RETICCTPCT in the last 72 hours. Urine analysis:    Component Value Date/Time   COLORURINE AMBER (A) 09/27/2018 0820   APPEARANCEUR CLEAR 09/27/2018 0820   LABSPEC 1.019 09/27/2018 0820   PHURINE 6.0 09/27/2018 0820   GLUCOSEU NEGATIVE 09/27/2018 0820   HGBUR NEGATIVE 09/27/2018 0820   BILIRUBINUR NEGATIVE 09/27/2018 0820   KETONESUR 5 (A) 09/27/2018 0820   PROTEINUR 30 (A) 09/27/2018 0820   NITRITE NEGATIVE 09/27/2018 0820   LEUKOCYTESUR NEGATIVE 09/27/2018 0820   Sepsis Labs: @LABRCNTIP (procalcitonin:4,lacticidven:4)  ) Recent Results (from the past 240 hour(s))  SARS Coronavirus 2 (CEPHEID - Performed in [place] hospital lab), Hosp Order     Status: None   Collection Time: 09/27/18  7:00 AM  Result Value Ref Range Status   SARS Coronavirus 2 NEGATIVE NEGATIVE Final    Comment: (NOTE) If result is NEGATIVE SARS-CoV-2 target nucleic acids are NOT DETECTED. The SARS-CoV-2 RNA is generally detectable in upper and lower  respiratory specimens during the acute phase of infection. The lowest  concentration of SARS-CoV-2 viral copies this assay can detect is 250  copies / mL. A negative result does not preclude SARS-CoV-2 infection  and should not be used as the sole basis for treatment or other  patient management decisions.  A negative result may occur with  improper specimen collection / handling,  submission of specimen other  than nasopharyngeal swab, presence of viral mutation(s) within the  areas targeted by this assay, and inadequate number of viral copies  (<250 copies / mL). A negative result must be combined with clinical  observations, patient history, and epidemiological information. If result is POSITIVE SARS-CoV-2 target nucleic acids are DETECTED. The SARS-CoV-2 RNA is generally detectable in upper and lower  respiratory specimens dur ing the acute phase of infection.  Positive  results are indicative of active infection with SARS-CoV-2.  Clinical  correlation with patient history and other diagnostic information is  necessary to determine patient infection status.  Positive results do  not rule out bacterial infection or co-infection with other viruses. If result is PRESUMPTIVE POSTIVE SARS-CoV-2 nucleic acids MAY BE PRESENT.   A presumptive positive result was obtained on the submitted specimen  and confirmed on repeat testing.  While 2019 novel coronavirus  (SARS-CoV-2) nucleic acids may be present in the submitted sample  additional confirmatory testing may be necessary for epidemiological  and / or clinical management purposes  to differentiate between  SARS-CoV-2 and other Sarbecovirus currently known to infect humans.  If clinically indicated additional testing with an alternate test  methodology [phone]) is advised. The SARS-CoV-2 RNA is generally  detectable in upper and lower respiratory sp ecimens during the acute  phase of infection. The expected result is Negative. Fact Sheet for Patients:  StrictlyIdeas.no Fact Sheet for Healthcare Providers: BankingDealers.co.za This test is not yet approved or cleared by the [place] FDA and has been authorized for detection and/or diagnosis of SARS-CoV-2 by FDA under an Emergency Use Authorization (EUA).  This EUA will remain in effect (meaning this test can be  used) for the duration of the COVID-19 declaration under Section 564(b)(1) of the Act, 21 U.S.C. section 360bbb-3(b)(1), unless the authorization is terminated or revoked sooner. Performed at [facility], [place] [address], [place], [place] [address]   Blood Culture (routine x 2)     Status: None   Collection Time: 09/27/18  7:21 AM  Result Value Ref  Range Status   Specimen Description BLOOD RIGHT HAND  Final   Special Requests   Final    BOTTLES DRAWN AEROBIC AND ANAEROBIC Blood Culture adequate volume   Culture   Final    NO GROWTH 5 DAYS Performed at [facility], [address] [place], [place] [address]    Report Status 10/02/2018 FINAL  Final  Blood Culture (routine x 2)     Status: None   Collection Time: 09/27/18  7:26 AM  Result Value Ref Range Status   Specimen Description BLOOD LEFT HAND  Final   Special Requests   Final    BOTTLES DRAWN AEROBIC AND ANAEROBIC Blood Culture adequate volume   Culture   Final    NO GROWTH 5 DAYS Performed at [facility], [place] [address], [place], [place] [address]    Report Status 10/02/2018 FINAL  Final  Culture, respiratory (tracheal aspirate)     Status: None   Collection Time: 09/27/18 10:02 AM  Result Value Ref Range Status   Specimen Description TRACHEAL ASPIRATE  Final   Special Requests Normal  Final   Gram Stain   Final    FEW WBC PRESENT, PREDOMINANTLY PMN RARE GRAM POSITIVE COCCI IN PAIRS    Culture   Final    RARE Consistent with normal respiratory flora. Performed at [facility], [place] [address], [place], [place] [address]    Report Status 09/30/2018 FINAL  Final  MRSA PCR Screening     Status: None   Collection Time: 09/27/18 11:48 AM  Result Value Ref Range Status   MRSA by PCR NEGATIVE NEGATIVE Final    Comment:        The GeneXpert MRSA Assay (FDA approved for NASAL specimens only), is one component of a comprehensive MRSA colonization surveillance program. It is not intended to  diagnose MRSA infection nor to guide or monitor treatment for MRSA infections. Performed at [facility], [place] [address], [place], [place] [address]   Culture, Urine     Status: None   Collection Time: 09/27/18  5:13 PM  Result Value Ref Range Status   Specimen Description URINE, CLEAN CATCH  Final   Special Requests NONE  Final   Culture   Final    NO GROWTH Performed at [facility], [place] [address] [place], [place] [address]    Report Status 09/28/2018 FINAL  Final      Studies: No results found.  Scheduled Meds: . feeding supplement (ENSURE ENLIVE)  237 mL Oral BID BM  . furosemide  20 mg Oral QODAY  . heparin  5,000 Units Subcutaneous Q8H  . ipratropium-albuterol  3 mL Nebulization TID  . magnesium oxide  400 mg Oral BID  . mometasone-formoterol  2 puff Inhalation BID  . multivitamin with minerals  1 tablet Oral Daily  . nicotine  14 mg Transdermal Q24H  . predniSONE  40 mg Oral Q breakfast    Continuous Infusions: . sodium chloride    . sodium chloride 250 mL (10/01/18 2147)     LOS: 5 days     Kayleen Memos, MD [facility] Pager [phone]  If 7PM-7AM, please contact night-coverage [email or web address] Password [facility] 10/02/2018, 10:33 AM

## 2018-10-02 NOTE — Progress Notes (Signed)
Occupational Therapy Treatment Patient Details Name: Evelyn Hughes MRN: [id number] DOB: [birth year] Today's Date: 10/02/2018    History of present illness Pt is 63 yo female, recently discharged for CHF, COPD with h/o tobacco abuse who returns due to declining respiratory status. Required reintubation. Extubated 09/29/18.     OT comments  Pt recalled yesterday's OT visit and pill box test. Continues to demonstrate slow processing and poor attention. Feet sore, performed seated grooming at sink, toileting with min guard assist. Pt on 2L 02 with stable VS. Left with RN for meds.   Follow Up Recommendations  Home health OT;Supervision/Assistance - 24 hour    Equipment Recommendations  3 in 1 bedside commode    Recommendations for Other Services      Precautions / Restrictions Precautions Precautions: Fall       Mobility Bed Mobility Overal bed mobility: Needs Assistance Bed Mobility: Supine to Sit;Sit to Supine     Supine to sit: Supervision Sit to supine: Supervision   General bed mobility comments: HOB up maximally  Transfers Overall transfer level: Needs assistance Equipment used: None Transfers: Sit to/from Stand Sit to Stand: Min guard         General transfer comment: reaching for sink, BSC     Balance Overall balance assessment: Needs assistance   Sitting balance-Leahy Scale: Good       Standing balance-Leahy Scale: Fair Standing balance comment: poor with dynamic                           ADL either performed or assessed with clinical judgement   ADL Overall ADL's : Needs assistance/impaired     Grooming: Brushing hair;Oral care;Sitting;Set up;Supervision/safety               Lower Body Dressing: Set up;Bed level Lower Body Dressing Details (indicate cue type and reason): socks Toilet Transfer: Min Geneticist, molecular Details (indicate cue type and reason): holds furniture Toileting- Water quality scientist and  Hygiene: Min guard;Sit to/from stand       Functional mobility during ADLs: Min guard(reaching for furniture)       Vision       Perception     Praxis      Cognition Arousal/Alertness: Awake/alert Behavior During Therapy: WFL for tasks assessed/performed Overall Cognitive Status: Impaired/Different from baseline Area of Impairment: Memory;Attention;Safety/judgement;Following commands;Problem solving                   Current Attention Level: Sustained Memory: Decreased short-term memory Following Commands: Follows one step commands with increased time Safety/Judgement: Decreased awareness of deficits;Decreased awareness of safety   Problem Solving: Slow processing;Difficulty sequencing;Requires verbal cues General Comments: thought when I reported "300" (cc of urine) that she was 63 years old, knew it was 2020 and where she is        Exercises     Shoulder Instructions       General Comments      Pertinent Vitals/ Pain       Pain Assessment: Faces Faces Pain Scale: Hurts little more Pain Location: BLE Pain Descriptors / Indicators: Discomfort;Sore Pain Intervention(s): Monitored during session;Repositioned  Home Living                                          Prior Functioning/Environment  Frequency  Min 3X/week        Progress Toward Goals  OT Goals(current goals can now be found in the care plan section)  Progress towards OT goals: Progressing toward goals  Acute Rehab OT Goals Patient Stated Goal: something warm to drink OT Goal Formulation: With patient Time For Goal Achievement: 10/14/18 Potential to Achieve Goals: Good  Plan Discharge plan remains appropriate;Frequency remains appropriate    Co-evaluation                 AM-PAC OT "6 Clicks" Daily Activity     Outcome Measure   Help from another person eating meals?: None Help from another person taking care of personal grooming?: A  Little Help from another person toileting, which includes using toliet, bedpan, or urinal?: A Little Help from another person bathing (including washing, rinsing, drying)?: A Little Help from another person to put on and taking off regular upper body clothing?: A Little Help from another person to put on and taking off regular lower body clothing?: A Little 6 Click Score: 19    End of Session Equipment Utilized During Treatment: Oxygen;Gait belt(2L)  OT Visit Diagnosis: Unsteadiness on feet (R26.81);Other abnormalities of gait and mobility (R26.89);Muscle weakness (generalized) (M62.81);Other symptoms and signs involving cognitive function   Activity Tolerance Patient tolerated treatment well   Patient Left in bed;with nursing/sitter in room(EOB for meds)   Nurse Communication Mobility status        Time: 6701-4103 OT Time Calculation (min): 28 min  Charges: OT General Charges $OT Visit: 1 Visit OT Treatments $Self Care/Home Management : 23-37 mins  Nestor Lewandowsky, OTR/L Acute Rehabilitation Services Pager: [phone] Office: [phone]   Malka So 10/02/2018, 9:32 AM

## 2018-10-02 NOTE — Progress Notes (Signed)
Physical Therapy Treatment Patient Details Name: Evelyn Hughes MRN: [id number] DOB: 01-27-1956 Today's Date: 10/02/2018    History of Present Illness Pt is 63 yo female, recently discharged for CHF, COPD with h/o tobacco abuse who returns due to declining respiratory status. Required reintubation. Extubated 09/29/18.      PT Comments    Pt performed gait training and functional mobility with min guard assistance for safety.  Pt has difficulty negotiating stairs due to edema in B legs and poor ROM to dorsiflex.  Pt with poor memory and problem solving during session. Pt continues to benefit from acute care PT services in acute setting to return to baseline.   Her cognition is terrible and this has not been address can you place an order for a cognitive assessment from SLP.     Follow Up Recommendations  No PT follow up     Equipment Recommendations  None recommended by PT    Recommendations for Other Services       Precautions / Restrictions Precautions Precautions: Fall Restrictions Weight Bearing Restrictions: No    Mobility  Bed Mobility Overal bed mobility: Needs Assistance       Supine to sit: Supervision Sit to supine: Supervision   General bed mobility comments: HOB elevated no assistance needed to mobilize into and OOB.    Transfers Overall transfer level: Needs assistance Equipment used: None;Rolling walker (2 wheeled) Transfers: Sit to/from Stand Sit to Stand: Min guard         General transfer comment: Cues for hand placement,  Pt reaching to grab O2 tank for support in standing.    Ambulation/Gait Ambulation/Gait assistance: Min guard Gait Distance (Feet): 180 Feet Assistive device: Rolling walker (2 wheeled) Gait Pattern/deviations: Step-through pattern;Trunk flexed Gait velocity: decreased   General Gait Details: pt on 2L O2, SpO2 94%, pt with improved endurance.    Stairs Stairs: Yes Stairs assistance: Min guard Stair Management: One  rail Right Number of Stairs: 4 General stair comments: Sequencing and increased effort to clear foot to next stair.     Wheelchair Mobility    Modified Rankin (Stroke Patients Only)       Balance Overall balance assessment: Needs assistance   Sitting balance-Leahy Scale: Good       Standing balance-Leahy Scale: Fair Standing balance comment: poor with dynamic                            Cognition Arousal/Alertness: Awake/alert Behavior During Therapy: WFL for tasks assessed/performed Overall Cognitive Status: Impaired/Different from baseline Area of Impairment: Memory;Attention;Safety/judgement;Following commands;Problem solving                   Current Attention Level: Sustained Memory: Decreased short-term memory Following Commands: Follows one step commands with increased time Safety/Judgement: Decreased awareness of deficits;Decreased awareness of safety Awareness: Emergent Problem Solving: Slow processing;Difficulty sequencing;Requires verbal cues General Comments: Pt unable to locate her room or recall room number despite being told before in halls.        Exercises      General Comments        Pertinent Vitals/Pain Pain Assessment: Faces Faces Pain Scale: Hurts little more Pain Location: BLE Pain Descriptors / Indicators: Discomfort;Sore Pain Intervention(s): Monitored during session;Repositioned    Home Living                      Prior Function  PT Goals (current goals can now be found in the care plan section) Acute Rehab PT Goals Patient Stated Goal: to drink a ginger ale Potential to Achieve Goals: Good Progress towards PT goals: Progressing toward goals    Frequency    Min 3X/week      PT Plan Current plan remains appropriate    Co-evaluation              AM-PAC PT "6 Clicks" Mobility   Outcome Measure  Help needed turning from your back to your side while in a flat bed without  using bedrails?: None Help needed moving from lying on your back to sitting on the side of a flat bed without using bedrails?: A Little Help needed moving to and from a bed to a chair (including a wheelchair)?: A Little Help needed standing up from a chair using your arms (e.g., wheelchair or bedside chair)?: A Little Help needed to walk in hospital room?: A Little Help needed climbing 3-5 steps with a railing? : A Lot 6 Click Score: 18    End of Session Equipment Utilized During Treatment: Gait belt;Oxygen Activity Tolerance: Patient tolerated treatment well Patient left: in chair;with call bell/phone within reach Nurse Communication: Mobility status PT Visit Diagnosis: Unsteadiness on feet (R26.81);Muscle weakness (generalized) (M62.81)     Time: 5825-1898 PT Time Calculation (min) (ACUTE ONLY): 21 min  Charges:  $Gait Training: 8-22 mins                     Governor Rooks, PTA Acute Rehabilitation Services Pager [phone] Office [phone]     Dontarius Sheley Eli Hose 10/02/2018, 4:50 PM

## 2018-10-03 ENCOUNTER — Inpatient Hospital Stay (HOSPITAL_COMMUNITY): Payer: BC Managed Care – PPO

## 2018-10-03 MED ORDER — TECHNETIUM TO 99M ALBUMIN AGGREGATED
1.5100 | Freq: Once | INTRAVENOUS | Status: AC | PRN
  Administered 2018-10-03: 1.51 via INTRAVENOUS

## 2018-10-03 MED ORDER — TECHNETIUM TC 99M DIETHYLENETRIAME-PENTAACETIC ACID
15.5000 | Freq: Once | INTRAVENOUS | Status: AC | PRN
  Administered 2018-10-03: 15.5 via RESPIRATORY_TRACT

## 2018-10-03 NOTE — Progress Notes (Signed)
PROGRESS NOTE  Evelyn Hughes VOZ:[id number] DOB: 30-Jun-1955 DOA: 09/27/2018 PCP: Patient, No Pcp Per  HPI/Recap of past 24 hours: The patient is an ill-appearing 63 year old female, current every day smoker recently admitted to the hospital ( 5/4-5/8)  and ultimately diagnosed with a combination of what was likely cor pulmonale and acute hypoxic respiratory distress and failure requiring intubation. She self extubated and was ultimately placed on nasal cannula and discharged home with oxygen. Per the husband's report and per EMS report the patient has had a gradual decline especially over the last 12 to 24 hours refusing to eat or drink with AMS. Husband called 54, Paramedics found the patient to be hypoxic, Increased her oxygen and transported her to the ED. She was intubated within 30 minutes of arrival. PCCM have been asked to admit to ICU  and manage care.  TR[facility] assumed care on 09/30/2018.  10/03/18: Patient seen and examined at her bedside.  She states her lower extremities swelling is improving with TED hose.  She denies any chest pain or shortness of breath at rest.  She is diuresing well urine output documented greater than 2200 in the last 24 hours.  Heart failure team is following.    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 Heart failure team is following Continue diuresis as recommended by heart failure team Continue TED hose per heart failure team V/Q pending PFTs with  [facility] Pulmonary rehab on discharge  Bilateral lower extremity 2+ pitting edema  Improving with TED hose and diuretics  Bilateral Doppler ultrasound negative for DVT No obvious discoloration or hyperpigmentation  Emphysema COPD exacerbation Counseled on the importance of tobacco use cessation Maintain O2 saturation greater than 90% Continue inhalers Continue prednisone 40 mg daily  Leukocytosis in the setting of recent CAP and steroid use WBC is trending down from 25K to 17K Afebrile with no sign of active infective process Weaning off steroids Currently on prednisone 40 mg daily   Hypervolemic hyponatremia Sodium 131 Continue fluid restriction  Hypomagnesemia Magnesium 1.5 Repleted Continue magnesium supplement Repeat magnesium level tomorrow  Tobacco use disorder Counseled on tobacco cessation at bedside Continue nicotine patch as needed   Code Status: Full  Family Communication: Will call to update over the phone  Disposition Plan: Discharge to home with home health services when cardiology signs off.   Consultants:  PCCM  Cardiology  Procedures:  Extubation on 09/29/2018  Antimicrobials: Cefepime  DVT prophylaxis: Subcu Lovenox daily   Objective: Vitals:   10/03/18 0452 10/03/18 0839 10/03/18 0858 10/03/18 1118  BP: 111/64  105/62 109/69  Pulse: 95  98 99  Resp: 18  18 20   Temp: 98 F (36.7 C)  98.5 F (36.9 C) 98.3 F (36.8 C)  TempSrc: Oral  Oral Oral  SpO2: 99% 99% 94% 94%  Weight: 51.6 kg     Height:        Intake/Output Summary (Last 24 hours) at 10/03/2018 1414 Last data filed at 10/03/2018 1236 Gross per 24 hour  Intake 1299 ml  Output 2000 ml  Net -701 ml  Filed Weights   10/01/18 0445 10/02/18 0409 10/03/18 0452  Weight: 52 kg 53.4 kg 51.6 kg    Exam:  . General: 63 y.o. year-old female well-developed well-nourished no acute distress.  Alert oriented x3.   . Cardiovascular: Regular rate and rhythm with no rubs or  gallops.  No JVD or thyromegaly noted. Marland Kitchen Respiratory: Clear to auscultation with no wheezes or rales.  Poor inspiratory effort. . Abdomen: Nontender nondistended with normal bowel sounds x4 quadrants.   . Musculoskeletal: TED hose in place 1+ pitting edema in lower extremities bilaterally. Marland Kitchen Psychiatry: Mood is appropriate for condition and setting.   Data Reviewed: CBC: Recent Labs  Lab 09/27/18 0718  09/28/18 0412 09/28/18 1126 09/29/18 0248 09/30/18 0332 10/01/18 0556 10/02/18 0557  WBC 14.7*   < > 26.1*  --  30.2* 25.0* 17.0* 17.9*  NEUTROABS 12.1*  --   --   --   --   --   --   --   HGB 14.2   < > 14.0 15.0 12.8 12.6 12.3 12.7  HCT 50.3*   < > 45.2 44.0 40.5 38.5 39.0 40.6  MCV 94.0   < > 86.4  --  85.6 84.2 85.0 85.5  PLT 216   < > 246  --  257 249 254 291   < > = values in this interval not displayed.   Basic Metabolic Panel: Recent Labs  Lab 09/27/18 1036  09/28/18 0412 09/28/18 1126 09/29/18 0248 09/30/18 0332 10/01/18 0556 10/02/18 0557  NA 130*   < > 136 134* 134* 129* 132* 131*  K 5.3*   < > 4.8 4.5 4.0 4.1 4.0 4.2  CL 83*  --  88*  --  87* 86* 92* 92*  CO2 30  --  35*  --  32 34* 31 31  GLUCOSE 122*  --  134*  --  133* 107* 93 81  BUN 21  --  27*  --  36* 29* 24* 21  CREATININE 0.47  --  0.71  --  0.54 0.58 0.54 0.57  CALCIUM 8.3*  --  9.1  --  8.5* 8.5* 8.3* 8.3*  MG 2.2  --  1.9  --  1.7 1.5* 2.1  --   PHOS 2.3*  --  2.1*  --  3.5 3.2 2.7  --    < > = values in this interval not displayed.   GFR: Estimated Creatinine Clearance: 59.4 mL/min (by C-G formula based on SCr of 0.57 mg/dL). Liver Function Tests: Recent Labs  Lab 09/27/18 0718 09/27/18 1036 09/30/18 0332  AST 68* 90*  --   ALT 263* 234*  --   ALKPHOS 68 70  --   BILITOT 1.1 2.5*  --   PROT 5.2* RESULTS UNAVAILABLE DUE TO INTERFERING SUBSTANCE  --   ALBUMIN 2.9* 2.7* 2.5*   No results for input(s): LIPASE, AMYLASE in the last 168 hours. No results for input(s): AMMONIA in the last  168 hours. Coagulation Profile: Recent Labs  Lab 09/27/18 1036  INR 1.0   Cardiac Enzymes: Recent Labs  Lab 09/27/18 0718 09/28/18 0412  TROPONINI <0.03 <0.03   BNP (last 3 results) No results for input(s): PROBNP in the last 8760 hours. HbA1C: No results for input(s): HGBA1C in the last 72 hours. CBG: Recent Labs  Lab 09/29/18 0011 09/29/18 0411 09/29/18 0812 09/29/18 1159 09/29/18 1554  GLUCAP 119* 133* 116* 108* 122*   Lipid Profile: No results for input(s): CHOL, HDL, LDLCALC, TRIG,  CHOLHDL, LDLDIRECT in the last 72 hours. Thyroid Function Tests: Recent Labs    10/02/18 0557  TSH 4.123   Anemia Panel: No results for input(s): VITAMINB12, FOLATE, FERRITIN, TIBC, IRON, RETICCTPCT in the last 72 hours. Urine analysis:    Component Value Date/Time   COLORURINE AMBER (A) 09/27/2018 0820   APPEARANCEUR CLEAR 09/27/2018 0820   LABSPEC 1.019 09/27/2018 0820   PHURINE 6.0 09/27/2018 0820   GLUCOSEU NEGATIVE 09/27/2018 0820   HGBUR NEGATIVE 09/27/2018 0820   BILIRUBINUR NEGATIVE 09/27/2018 0820   KETONESUR 5 (A) 09/27/2018 0820   PROTEINUR 30 (A) 09/27/2018 0820   NITRITE NEGATIVE 09/27/2018 0820   LEUKOCYTESUR NEGATIVE 09/27/2018 0820   Sepsis Labs: @LABRCNTIP (procalcitonin:4,lacticidven:4)  ) Recent Results (from the past 240 hour(s))  SARS Coronavirus 2 (CEPHEID - Performed in [place] hospital lab), Hosp Order     Status: None   Collection Time: 09/27/18  7:00 AM  Result Value Ref Range Status   SARS Coronavirus 2 NEGATIVE NEGATIVE Final    Comment: (NOTE) If result is NEGATIVE SARS-CoV-2 target nucleic acids are NOT DETECTED. The SARS-CoV-2 RNA is generally detectable in upper and lower  respiratory specimens during the acute phase of infection. The lowest  concentration of SARS-CoV-2 viral copies this assay can detect is 250  copies / mL. A negative result does not preclude SARS-CoV-2 infection  and should not be used as the sole basis for  treatment or other  patient management decisions.  A negative result may occur with  improper specimen collection / handling, submission of specimen other  than nasopharyngeal swab, presence of viral mutation(s) within the  areas targeted by this assay, and inadequate number of viral copies  (<250 copies / mL). A negative result must be combined with clinical  observations, patient history, and epidemiological information. If result is POSITIVE SARS-CoV-2 target nucleic acids are DETECTED. The SARS-CoV-2 RNA is generally detectable in upper and lower  respiratory specimens dur ing the acute phase of infection.  Positive  results are indicative of active infection with SARS-CoV-2.  Clinical  correlation with patient history and other diagnostic information is  necessary to determine patient infection status.  Positive results do  not rule out bacterial infection or co-infection with other viruses. If result is PRESUMPTIVE POSTIVE SARS-CoV-2 nucleic acids MAY BE PRESENT.   A presumptive positive result was obtained on the submitted specimen  and confirmed on repeat testing.  While 2019 novel coronavirus  (SARS-CoV-2) nucleic acids may be present in the submitted sample  additional confirmatory testing may be necessary for epidemiological  and / or clinical management purposes  to differentiate between  SARS-CoV-2 and other Sarbecovirus currently known to infect humans.  If clinically indicated additional testing with an alternate test  methodology [phone]) is advised. The SARS-CoV-2 RNA is generally  detectable in upper and lower respiratory sp ecimens during the acute  phase of infection. The expected result is Negative. Fact Sheet for Patients:  StrictlyIdeas.no Fact Sheet for Healthcare Providers: BankingDealers.co.za This test is not yet approved or cleared by the [place] FDA and has been authorized for detection and/or  diagnosis of SARS-CoV-2 by FDA under an Emergency Use Authorization (EUA).  This EUA will remain in effect (meaning this test can be used) for the duration of the COVID-19 declaration under Section 564(b)(1) of the Act, 21 U.S.C. section 360bbb-3(b)(1), unless the authorization is terminated or revoked sooner. Performed at [facility], [place] [address] [place], [place] [address]   Blood  Culture (routine x 2)     Status: None   Collection Time: 09/27/18  7:21 AM  Result Value Ref Range Status   Specimen Description BLOOD RIGHT HAND  Final   Special Requests   Final    BOTTLES DRAWN AEROBIC AND ANAEROBIC Blood Culture adequate volume   Culture   Final    NO GROWTH 5 DAYS Performed at [facility], [address], [place], [place] [address]    Report Status 10/02/2018 FINAL  Final  Blood Culture (routine x 2)     Status: None   Collection Time: 09/27/18  7:26 AM  Result Value Ref Range Status   Specimen Description BLOOD LEFT HAND  Final   Special Requests   Final    BOTTLES DRAWN AEROBIC AND ANAEROBIC Blood Culture adequate volume   Culture   Final    NO GROWTH 5 DAYS Performed at [facility], [place] [address], [place], [place] [address]    Report Status 10/02/2018 FINAL  Final  Culture, respiratory (tracheal aspirate)     Status: None   Collection Time: 09/27/18 10:02 AM  Result Value Ref Range Status   Specimen Description TRACHEAL ASPIRATE  Final   Special Requests Normal  Final   Gram Stain   Final    FEW WBC PRESENT, PREDOMINANTLY PMN RARE GRAM POSITIVE COCCI IN PAIRS    Culture   Final    RARE Consistent with normal respiratory flora. Performed at [facility], [place] [address], [place], [place] [address]    Report Status 09/30/2018 FINAL  Final  MRSA PCR Screening     Status: None   Collection Time: 09/27/18 11:48 AM  Result Value Ref Range Status   MRSA by PCR NEGATIVE NEGATIVE Final    Comment:        The GeneXpert MRSA Assay (FDA  approved for NASAL specimens only), is one component of a comprehensive MRSA colonization surveillance program. It is not intended to diagnose MRSA infection nor to guide or monitor treatment for MRSA infections. Performed at [facility], [place] [address], [place], [place] [address]   Culture, Urine     Status: None   Collection Time: 09/27/18  5:13 PM  Result Value Ref Range Status   Specimen Description URINE, CLEAN CATCH  Final   Special Requests NONE  Final   Culture   Final    NO GROWTH Performed at [facility], [place] [address] [place], [place] [address]    Report Status 09/28/2018 FINAL  Final      Studies: Dg Chest 2 View  Result Date: 10/03/2018 CLINICAL DATA:  Recently discharged for CHF.  Shortness of breath. EXAM: CHEST - 2 VIEW COMPARISON:  Sep 29, 2018 FINDINGS: Small bilateral pleural effusions, right greater than left, with underlying atelectasis. No pneumothorax. The cardiomediastinal silhouette is stable. IMPRESSION: Small bilateral pleural effusions with underlying atelectasis are new in the interval. Electronically Signed   By: Dorise Bullion III M.D   On: 10/03/2018 11:18   Nm Pulmonary Perf And Vent  Result Date: 10/03/2018 CLINICAL DATA:  Suspected pulmonary embolus.  Shortness of breath. EXAM: NUCLEAR MEDICINE VENTILATION - PERFUSION LUNG SCAN TECHNIQUE: Ventilation images were obtained in multiple projections using inhaled aerosol Tc-67m DTPA. Perfusion images were obtained in multiple projections after intravenous injection of Tc-85m MAA. RADIOPHARMACEUTICALS:  15.5 mCi of Tc-66m DTPA aerosol inhalation and 1.5 mCi Tc50m MAA IV COMPARISON:  Chest x-ray Oct 03, 2018 FINDINGS:  Ventilation: There are multiple bilateral small matched defects. There is a single large matched defect in the right base. Perfusion: There are multiple bilateral small matched defects. There is a single large matched defect in the right base. IMPRESSION: Very low probability  V/Q scan. Electronically Signed   By: Dorise Bullion III M.D   On: 10/03/2018 11:17    Scheduled Meds: . feeding supplement (ENSURE ENLIVE)  237 mL Oral BID BM  . furosemide  40 mg Intravenous BID  . heparin  5,000 Units Subcutaneous Q8H  . ipratropium-albuterol  3 mL Nebulization TID  . magnesium oxide  400 mg Oral BID  . mometasone-formoterol  2 puff Inhalation BID  . multivitamin with minerals  1 tablet Oral Daily  . nicotine  14 mg Transdermal Q24H  . predniSONE  40 mg Oral Q breakfast  . spironolactone  12.5 mg Oral Daily    Continuous Infusions: . sodium chloride    . sodium chloride 250 mL (10/01/18 2147)     LOS: 6 days     Kayleen Memos, MD [facility] Pager [phone]  If 7PM-7AM, please contact night-coverage [email or web address] Password [facility] 10/03/2018, 2:14 PM

## 2018-10-03 NOTE — Progress Notes (Signed)
Advanced Heart Failure Rounding Note   Subjective:     LE edema improving with TED hose and diuresis. Weight down 4 pounds.  Denies SOB or dizziness. Renal function stable.    Objective:   Weight Range:  Vital Signs:   Temp:  [98 F (36.7 C)-98.8 F (37.1 C)] 98.5 F (36.9 C) (05/16 0858) Pulse Rate:  [88-106] 98 (05/16 0858) Resp:  [18-20] 18 (05/16 0858) BP: (105-115)/(58-64) 105/62 (05/16 0858) SpO2:  [94 %-99 %] 94 % (05/16 0858) Weight:  [51.6 kg] 51.6 kg (05/16 0452) Last BM Date: 10/01/18  Weight change: Filed Weights   10/01/18 0445 10/02/18 0409 10/03/18 0452  Weight: 52 kg 53.4 kg 51.6 kg    Intake/Output:   Intake/Output Summary (Last 24 hours) at 10/03/2018 0923 Last data filed at 10/03/2018 0838 Gross per 24 hour  Intake 1240 ml  Output 2250 ml  Net -1010 ml     Physical Exam: General:  Chronically ill appearing  No resp difficulty. Hoarse HEENT: normal Neck: supple. JVP 8-9. Carotids 2+ bilat; no bruits. No lymphadenopathy or thryomegaly appreciated. Cor: PMI nondisplaced. Regular rate & rhythm. No rubs, gallops or murmurs. Lungs: markedly decreased BS throughout. No wheeze Abdomen: soft, nontender, + distended. No hepatosplenomegaly. No bruits or masses. Good bowel sounds. Extremities: no cyanosis, clubbing, rash, 1+ edema + TED hose Neuro: alert & orientedx3, cranial nerves grossly intact. moves all 4 extremities w/o difficulty. Affect pleasant  Telemetry: NSR 90s Personally reviewed   Labs: Basic Metabolic Panel: Recent Labs  Lab 09/27/18 1036  09/28/18 0412 09/28/18 1126 09/29/18 0248 09/30/18 0332 10/01/18 0556 10/02/18 0557  NA 130*   < > 136 134* 134* 129* 132* 131*  K 5.3*   < > 4.8 4.5 4.0 4.1 4.0 4.2  CL 83*  --  88*  --  87* 86* 92* 92*  CO2 30  --  35*  --  32 34* 31 31  GLUCOSE 122*  --  134*  --  133* 107* 93 81  BUN 21  --  27*  --  36* 29* 24* 21  CREATININE 0.47  --  0.71  --  0.54 0.58 0.54 0.57  CALCIUM 8.3*   --  9.1  --  8.5* 8.5* 8.3* 8.3*  MG 2.2  --  1.9  --  1.7 1.5* 2.1  --   PHOS 2.3*  --  2.1*  --  3.5 3.2 2.7  --    < > = values in this interval not displayed.    Liver Function Tests: Recent Labs  Lab 09/27/18 0718 09/27/18 1036 09/30/18 0332  AST 68* 90*  --   ALT 263* 234*  --   ALKPHOS 68 70  --   BILITOT 1.1 2.5*  --   PROT 5.2* RESULTS UNAVAILABLE DUE TO INTERFERING SUBSTANCE  --   ALBUMIN 2.9* 2.7* 2.5*   No results for input(s): LIPASE, AMYLASE in the last 168 hours. No results for input(s): AMMONIA in the last 168 hours.  CBC: Recent Labs  Lab 09/27/18 0718  09/28/18 0412 09/28/18 1126 09/29/18 0248 09/30/18 0332 10/01/18 0556 10/02/18 0557  WBC 14.7*   < > 26.1*  --  30.2* 25.0* 17.0* 17.9*  NEUTROABS 12.1*  --   --   --   --   --   --   --   HGB 14.2   < > 14.0 15.0 12.8 12.6 12.3 12.7  HCT 50.3*   < > 45.2 44.0  40.5 38.5 39.0 40.6  MCV 94.0   < > 86.4  --  85.6 84.2 85.0 85.5  PLT 216   < > 246  --  257 249 254 291   < > = values in this interval not displayed.    Cardiac Enzymes: Recent Labs  Lab 09/27/18 0718 09/28/18 0412  TROPONINI <0.03 <0.03    BNP: BNP (last 3 results) Recent Labs    09/23/18 1433 09/27/18 0718 09/28/18 0412  BNP 339.1* 1,852.5* 230.6*    ProBNP (last 3 results) No results for input(s): PROBNP in the last 8760 hours.    Other results:  Imaging:  No results found.   Medications:     Scheduled Medications: . feeding supplement (ENSURE ENLIVE)  237 mL Oral BID BM  . furosemide  40 mg Intravenous BID  . heparin  5,000 Units Subcutaneous Q8H  . ipratropium-albuterol  3 mL Nebulization TID  . magnesium oxide  400 mg Oral BID  . mometasone-formoterol  2 puff Inhalation BID  . multivitamin with minerals  1 tablet Oral Daily  . nicotine  14 mg Transdermal Q24H  . predniSONE  40 mg Oral Q breakfast  . spironolactone  12.5 mg Oral Daily     Infusions: . sodium chloride    . sodium chloride 250 mL  (10/01/18 2147)     PRN Medications:  sodium chloride, acetaminophen, albuterol, ipratropium-albuterol, ondansetron (ZOFRAN) IV   Assessment/plan:   1. 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 She has significant LE edema. - Improving with diuresis and O2.  - VQ pending - PFTs with DLCO - Continue TED hose  - Continue diuresis - O2 supplementation to keep sats >- 90% - If not improving can proceed with RHC on Monday - No role for selective pulmonary artery vasodilators at this point.  - Pulmoanry rehab on d/c   2. Acute on chronic respiratory failure due to advanced COPD - improving - continue O2  3. H/o Remote DVT due to OCPs - LE dopplers negative this admit - VQ pending  Length of Stay: [place] 10/03/2018, 9:23 AM  Advanced Heart Failure Team Pager [phone] (M-F; 7a - 4p)  Please contact [facility] for night-coverage after hours (4p -7a ) and weekends on [email or web address]

## 2018-10-04 LAB — MAGNESIUM: Magnesium: 2 mg/dL (ref 1.7–2.4)

## 2018-10-04 LAB — BASIC METABOLIC PANEL
Anion gap: 12 (ref 5–15)
BUN: 20 mg/dL (ref 8–23)
CO2: 36 mmol/L — ABNORMAL HIGH (ref 22–32)
Calcium: 8.6 mg/dL — ABNORMAL LOW (ref 8.9–10.3)
Chloride: 88 mmol/L — ABNORMAL LOW (ref 98–111)
Creatinine, Ser: 0.49 mg/dL (ref 0.44–1.00)
GFR calc Af Amer: >60 mL/min (ref >60)
GFR calc non Af Amer: >60 mL/min (ref >60)
Glucose, Bld: 69 mg/dL — ABNORMAL LOW (ref 70–99)
Potassium: 3.8 mmol/L (ref 3.5–5.1)
Sodium: 136 mmol/L (ref 135–145)

## 2018-10-04 LAB — PHOSPHORUS: Phosphorus: 2.1 mg/dL — ABNORMAL LOW (ref 2.5–4.6)

## 2018-10-04 MED ORDER — SPIRONOLACTONE 25 MG PO TABS: 25.0000 mg | ORAL_TABLET | Freq: Every day | ORAL | Status: DC

## 2018-10-04 MED ORDER — SODIUM CHLORIDE 0.9 % IV SOLN: 250.0000 mL | INTRAVENOUS | Status: DC | PRN

## 2018-10-04 MED ORDER — ASPIRIN 81 MG PO CHEW
81.0000 mg | CHEWABLE_TABLET | ORAL | Status: AC
  Administered 2018-10-05: 81 mg via ORAL
  Filled 2018-10-04: qty 1

## 2018-10-04 MED ORDER — SODIUM CHLORIDE 0.9% FLUSH
3.0000 mL | Freq: Two times a day (BID) | INTRAVENOUS | Status: DC
  Administered 2018-10-04: 3 mL via INTRAVENOUS

## 2018-10-04 MED ORDER — FUROSEMIDE 20 MG PO TABS
20.0000 mg | ORAL_TABLET | Freq: Every day | ORAL | Status: DC
  Administered 2018-10-05 – 2018-10-06 (×2): 20 mg via ORAL
  Filled 2018-10-04 (×2): qty 1

## 2018-10-04 MED ORDER — SODIUM CHLORIDE 0.9 % IV SOLN: INTRAVENOUS | Status: DC

## 2018-10-04 MED ORDER — ENOXAPARIN SODIUM 40 MG/0.4ML ~~LOC~~ SOLN
40.0000 mg | SUBCUTANEOUS | Status: DC
  Administered 2018-10-04 – 2018-10-06 (×3): 40 mg via SUBCUTANEOUS
  Filled 2018-10-04 (×3): qty 0.4

## 2018-10-04 MED ORDER — FUROSEMIDE 20 MG PO TABS: 20.0000 mg | ORAL_TABLET | Freq: Every day | ORAL | Status: DC

## 2018-10-04 MED ORDER — SODIUM CHLORIDE 0.9% FLUSH: 3.0000 mL | INTRAVENOUS | Status: DC | PRN

## 2018-10-04 NOTE — Progress Notes (Signed)
Occupational Therapy Treatment Patient Details Name: Evelyn Hughes MRN: [id number] DOB: 06/15/1955 Today's Date: 10/04/2018    History of present illness Pt is 63 yo female, recently discharged for CHF, COPD with h/o tobacco abuse who returns due to declining respiratory status. Required reintubation. Extubated 09/29/18.     OT comments  Pt. Seen for skilled OT session.  Pt. Able to complete bsc transfer for toileting tasks.  Introduction and review of compensatory strategies and energy conservation.  Slow processing with word finding and cognitive delays continue form previous documentation.  Agree with current recommendation for 24/7 S at d/c.    Follow Up Recommendations  Home health OT;Supervision/Assistance - 24 hour    Equipment Recommendations  3 in 1 bedside commode    Recommendations for Other Services      Precautions / Restrictions Precautions Precautions: Fall Restrictions Weight Bearing Restrictions: No       Mobility Bed Mobility Overal bed mobility: Needs Assistance Bed Mobility: Supine to Sit     Supine to sit: Supervision        Transfers Overall transfer level: Needs assistance Equipment used: None(intermittent furniture walking) Transfers: Sit to/from [employer] Transfers Sit to Stand: Min guard Stand pivot transfers: Min guard            Balance                                           ADL either performed or assessed with clinical judgement   ADL Overall ADL's : Needs assistance/impaired                     Lower Body Dressing: Moderate assistance;Sitting/lateral leans Lower Body Dressing Details (indicate cue type and reason): compression socks Toilet Transfer: Min Geneticist, molecular Details (indicate cue type and reason): holds furniture [place] and Hygiene: Min guard;Sit to/from stand Toileting - Clothing Manipulation Details (indicate cue type and  reason): able to perform front and back peri care     Functional mobility during ADLs: Min guard General ADL Comments: confusion continues - notable delay with answering questions and processing thoughts with word finding issues during conversation     Vision       Perception     Praxis      Cognition Arousal/Alertness: Awake/alert   Overall Cognitive Status: Impaired/Different from baseline Area of Impairment: Memory;Attention;Safety/judgement;Following commands;Problem solving                     Memory: Decreased short-term memory Following Commands: Follows one step commands with increased time Safety/Judgement: Decreased awareness of deficits;Decreased awareness of safety   Problem Solving: Slow processing;Difficulty sequencing;Requires verbal cues          Exercises     Shoulder Instructions       General Comments      Pertinent Vitals/ Pain       Pain Assessment: No/denies pain  Home Living                                          Prior Functioning/Environment              Frequency  Min 3X/week        Progress Toward Goals  OT Goals(current goals can  now be found in the care plan section)  Progress towards OT goals: Progressing toward goals     Plan Discharge plan remains appropriate;Frequency remains appropriate    Co-evaluation                 AM-PAC OT "6 Clicks" Daily Activity     Outcome Measure   Help from another person eating meals?: None Help from another person taking care of personal grooming?: A Little Help from another person toileting, which includes using toliet, bedpan, or urinal?: A Little Help from another person bathing (including washing, rinsing, drying)?: A Little Help from another person to put on and taking off regular upper body clothing?: A Little Help from another person to put on and taking off regular lower body clothing?: A Little 6 Click Score: 19    End of Session  Equipment Utilized During Treatment: Oxygen  OT Visit Diagnosis: Unsteadiness on feet (R26.81);Other abnormalities of gait and mobility (R26.89);Muscle weakness (generalized) (M62.81);Other symptoms and signs involving cognitive function   Activity Tolerance Patient tolerated treatment well   Patient Left in chair;with call bell/phone within reach;with chair alarm set   Nurse Communication          Time: 3536-1443 OT Time Calculation (min): 16 min  Charges: OT General Charges $OT Visit: 1 Visit OT Treatments $Self Care/Home Management : 8-22 mins  Janice Coffin, COTA/L 10/04/2018, 9:10 AM

## 2018-10-04 NOTE — Progress Notes (Signed)
Patient alert and oriented, patient husband notified about the cardiac catheterization tomorrow, husband said he is okay as long as his wife okay with the procedure, consent sign by the patient.

## 2018-10-04 NOTE — Progress Notes (Signed)
PROGRESS NOTE  Evelyn Hughes ALP:[id number] DOB: Oct 19, 1955 DOA: 09/27/2018 PCP: Patient, No Pcp Per  HPI/Recap of past 24 hours: The patient is an ill-appearing 63 year old female, current every day smoker recently admitted to the hospital ( 5/4-5/8)  and ultimately diagnosed with a combination of what was likely cor pulmonale and acute hypoxic respiratory distress and failure requiring intubation. She self extubated and was ultimately placed on nasal cannula and discharged home with oxygen. Per the husband's report and per EMS report the patient has had a gradual decline especially over the last 12 to 24 hours refusing to eat or drink with AMS. Husband called 64, Paramedics found the patient to be hypoxic, Increased her oxygen and transported her to the ED. She was intubated within 30 minutes of arrival. PCCM have been asked to admit to ICU  and manage care.  [facility] assumed care on 09/30/2018.  10/04/18: Patient seen and examined at bedside.  No acute events overnight.  Reports intermittent nonproductive cough.  Denies chest pain or dyspnea at rest.  TED hose in place.  Heart failure team following.    Assessment/Plan: Active Problems:   Respiratory failure with hypoxia and hypercapnia (HCC)   Pressure injury of skin  Resolving acute hypoxic hypercarbic respiratory failure, multifactorial secondary to acute volume overload, bilateral pleural effusion, COPD exacerbation Extubated on 09/29/2018 Currently on 2 L of oxygen by nasal cannula and saturating 97% Off Solu-Medrol Continue prednisone 40 mg daily Completed 5 days of IV cefepime Independent reviewed chest x-ray done on 10/03/2018 which showed small bilateral pleural effusions and right lower lobe atelectasis Patient encouraged to use incentive spirometer.  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 Pulmonary rehab on discharge  Bilateral lower extremity 2+ pitting edema, improving with TED hose Improving with TED hose and diuretics  Bilateral Doppler ultrasound negative for DVT No obvious discoloration or hyperpigmentation  Emphysema COPD exacerbation Counseled on the importance of tobacco use cessation Maintain O2 saturation greater than 90% Continue inhalers Continue prednisone 40 mg daily  Leukocytosis in the setting of recent CAP and steroid use WBC is trending down from 25K to 17K Afebrile with no sign of active infective process Weaning off steroids Currently on prednisone 40 mg daily  Repeat CBC tomorrow  Resolved hypervolemic hyponatremia Sodium 136 on 10/04/2018 from 131 on 10/02/2018 Continue fluid restriction  Resolved hypomagnesemia post repletion  Tobacco use disorder Counseled on tobacco cessation at bedside Continue nicotine patch as needed   Code Status: Full  Family Communication: Will call to update over the phone  Disposition Plan: Discharge to home with home health services when cardiology signs off.   Consultants:  PCCM  Cardiology  Procedures:  Extubation on 09/29/2018  Antimicrobials: Cefepime  DVT prophylaxis: Subcu Lovenox daily   Objective: Vitals:   10/03/18 2028 10/04/18 0628 10/04/18 0742 10/04/18 0853  BP:  112/69 105/69   Pulse:  81 82   Resp:  18    Temp:  98.7 F (37.1 C) 98.8 F (37.1 C)   TempSrc:  Oral Oral   SpO2: 99% 96% 97% 96%  Weight:  50.5 kg    Height:        Intake/Output Summary (Last 24 hours) at 10/04/2018 1047 Last data filed at 10/04/2018 0950 Gross per 24 hour  Intake  1419 ml  Output 2600 ml  Net -1181 ml   Filed Weights   10/02/18 0409 10/03/18 0452 10/04/18 0628  Weight: 53.4 kg 51.6 kg 50.5 kg    Exam:   General: 63 y.o. year-old female thin built in no acute  distress.  On nasal cannula.  Alert and oriented x3.    Cardiovascular: Regular rate and rhythm with no rubs or gallops positive.  No JVD or thyromegaly.  Respiratory: Mild rales at bases with no wheezes noted.  Poor inspiratory effort.  Abdomen: Nontender nondistended with normal bowel sounds x4 quadrants.  Musculoskeletal: TED hose in place in lower extremities bilaterally.  Psychiatry: Mood is appropriate for condition and setting.  Data Reviewed: CBC: Recent Labs  Lab 09/28/18 0412 09/28/18 1126 09/29/18 0248 09/30/18 0332 10/01/18 0556 10/02/18 0557  WBC 26.1*  --  30.2* 25.0* 17.0* 17.9*  HGB 14.0 15.0 12.8 12.6 12.3 12.7  HCT 45.2 44.0 40.5 38.5 39.0 40.6  MCV 86.4  --  85.6 84.2 85.0 85.5  PLT 246  --  257 249 254 630   Basic Metabolic Panel: Recent Labs  Lab 09/28/18 0412  09/29/18 0248 09/30/18 0332 10/01/18 0556 10/02/18 0557 10/04/18 0507  NA 136   < > 134* 129* 132* 131* 136  K 4.8   < > 4.0 4.1 4.0 4.2 3.8  CL 88*  --  87* 86* 92* 92* 88*  CO2 35*  --  32 34* 31 31 36*  GLUCOSE 134*  --  133* 107* 93 81 69*  BUN 27*  --  36* 29* 24* 21 20  CREATININE 0.71  --  0.54 0.58 0.54 0.57 0.49  CALCIUM 9.1  --  8.5* 8.5* 8.3* 8.3* 8.6*  MG 1.9  --  1.7 1.5* 2.1  --  2.0  PHOS 2.1*  --  3.5 3.2 2.7  --  2.1*   < > = values in this interval not displayed.   GFR: Estimated Creatinine Clearance: 58.1 mL/min (by C-G formula based on SCr of 0.49 mg/dL). Liver Function Tests: Recent Labs  Lab 09/30/18 0332  ALBUMIN 2.5*   No results for input(s): LIPASE, AMYLASE in the last 168 hours. No results for input(s): AMMONIA in the last 168 hours. Coagulation Profile: No results for input(s): INR, PROTIME in the last 168 hours. Cardiac Enzymes: Recent Labs  Lab 09/28/18 0412  TROPONINI <0.03   BNP (last 3 results) No results for input(s): PROBNP in the last 8760 hours. HbA1C: No results for input(s): HGBA1C in the last 72 hours. CBG: Recent Labs  Lab  09/29/18 0011 09/29/18 0411 09/29/18 0812 09/29/18 1159 09/29/18 1554  GLUCAP 119* 133* 116* 108* 122*   Lipid Profile: No results for input(s): CHOL, HDL, LDLCALC, TRIG, CHOLHDL, LDLDIRECT in the last 72 hours. Thyroid Function Tests: Recent Labs    10/02/18 0557  TSH 4.123   Anemia Panel: No results for input(s): VITAMINB12, FOLATE, FERRITIN, TIBC, IRON, RETICCTPCT in the last 72 hours. Urine analysis:    Component Value Date/Time   COLORURINE AMBER (A) 09/27/2018 0820   APPEARANCEUR CLEAR 09/27/2018 0820   LABSPEC 1.019 09/27/2018 0820   PHURINE 6.0 09/27/2018 0820   GLUCOSEU NEGATIVE 09/27/2018 0820   HGBUR NEGATIVE 09/27/2018 0820   BILIRUBINUR NEGATIVE 09/27/2018 0820   KETONESUR 5 (A) 09/27/2018 0820   PROTEINUR 30 (A) 09/27/2018 0820   NITRITE NEGATIVE 09/27/2018 0820   LEUKOCYTESUR NEGATIVE 09/27/2018 0820   Sepsis Labs: @LABRCNTIP (procalcitonin:4,lacticidven:4)  ) Recent Results (from the past  240 hour(s))  SARS Coronavirus 2 (CEPHEID - Performed in [facility] hospital lab), Hosp Order     Status: None   Collection Time: 09/27/18  7:00 AM  Result Value Ref Range Status   SARS Coronavirus 2 NEGATIVE NEGATIVE Final    Comment: (NOTE) If result is NEGATIVE SARS-CoV-2 target nucleic acids are NOT DETECTED. The SARS-CoV-2 RNA is generally detectable in upper and lower  respiratory specimens during the acute phase of infection. The lowest  concentration of SARS-CoV-2 viral copies this assay can detect is 250  copies / mL. A negative result does not preclude SARS-CoV-2 infection  and should not be used as the sole basis for treatment or other  patient management decisions.  A negative result may occur with  improper specimen collection / handling, submission of specimen other  than nasopharyngeal swab, presence of viral mutation(s) within the  areas targeted by this assay, and inadequate number of viral copies  (<250 copies / mL). A negative result must be  combined with clinical  observations, patient history, and epidemiological information. If result is POSITIVE SARS-CoV-2 target nucleic acids are DETECTED. The SARS-CoV-2 RNA is generally detectable in upper and lower  respiratory specimens dur ing the acute phase of infection.  Positive  results are indicative of active infection with SARS-CoV-2.  Clinical  correlation with patient history and other diagnostic information is  necessary to determine patient infection status.  Positive results do  not rule out bacterial infection or co-infection with other viruses. If result is PRESUMPTIVE POSTIVE SARS-CoV-2 nucleic acids MAY BE PRESENT.   A presumptive positive result was obtained on the submitted specimen  and confirmed on repeat testing.  While 2019 novel coronavirus  (SARS-CoV-2) nucleic acids may be present in the submitted sample  additional confirmatory testing may be necessary for epidemiological  and / or clinical management purposes  to differentiate between  SARS-CoV-2 and other Sarbecovirus currently known to infect humans.  If clinically indicated additional testing with an alternate test  methodology [phone]) is advised. The SARS-CoV-2 RNA is generally  detectable in upper and lower respiratory sp ecimens during the acute  phase of infection. The expected result is Negative. Fact Sheet for Patients:  StrictlyIdeas.no Fact Sheet for Healthcare Providers: BankingDealers.co.za This test is not yet approved or cleared by the [place] FDA and has been authorized for detection and/or diagnosis of SARS-CoV-2 by FDA under an Emergency Use Authorization (EUA).  This EUA will remain in effect (meaning this test can be used) for the duration of the COVID-19 declaration under Section 564(b)(1) of the Act, 21 U.S.C. section 360bbb-3(b)(1), unless the authorization is terminated or revoked sooner. Performed at [facility], [place] [address], [place], [place] [address]   Blood Culture (routine x 2)     Status: None   Collection Time: 09/27/18  7:21 AM  Result Value Ref Range Status   Specimen Description BLOOD RIGHT HAND  Final   Special Requests   Final    BOTTLES DRAWN AEROBIC AND ANAEROBIC Blood Culture adequate volume   Culture   Final    NO GROWTH 5 DAYS Performed at [facility], [place] [address], [place], [place] [address]    Report Status 10/02/2018 FINAL  Final  Blood Culture (routine x 2)     Status: None   Collection Time: 09/27/18  7:26 AM  Result Value Ref Range Status   Specimen Description BLOOD LEFT HAND  Final   Special Requests  Final    BOTTLES DRAWN AEROBIC AND ANAEROBIC Blood Culture adequate volume   Culture   Final    NO GROWTH 5 DAYS Performed at [facility], [place] [address] [place], [place] [address]    Report Status 10/02/2018 FINAL  Final  Culture, respiratory (tracheal aspirate)     Status: None   Collection Time: 09/27/18 10:02 AM  Result Value Ref Range Status   Specimen Description TRACHEAL ASPIRATE  Final   Special Requests Normal  Final   Gram Stain   Final    FEW WBC PRESENT, PREDOMINANTLY PMN RARE GRAM POSITIVE COCCI IN PAIRS    Culture   Final    RARE Consistent with normal respiratory flora. Performed at [facility], [place] [address] [place], [place] [address]    Report Status 09/30/2018 FINAL  Final  MRSA PCR Screening     Status: None   Collection Time: 09/27/18 11:48 AM  Result Value Ref Range Status   MRSA by PCR NEGATIVE NEGATIVE Final    Comment:        The GeneXpert MRSA Assay (FDA approved for NASAL specimens only), is one component of a comprehensive MRSA colonization surveillance program. It is not intended to diagnose MRSA infection nor to guide or monitor treatment for MRSA infections. Performed at [facility], [place] [address], [place], [place] [address]   Culture, Urine     Status: None   Collection Time:  09/27/18  5:13 PM  Result Value Ref Range Status   Specimen Description URINE, CLEAN CATCH  Final   Special Requests NONE  Final   Culture   Final    NO GROWTH Performed at [facility], [place] [address], [place], [place] [address]    Report Status 09/28/2018 FINAL  Final      Studies: Nm Pulmonary Perf And Vent  Result Date: 10/03/2018 CLINICAL DATA:  Suspected pulmonary embolus.  Shortness of breath. EXAM: NUCLEAR MEDICINE VENTILATION - PERFUSION LUNG SCAN TECHNIQUE: Ventilation images were obtained in multiple projections using inhaled aerosol Tc-28m DTPA. Perfusion images were obtained in multiple projections after intravenous injection of Tc-86m MAA. RADIOPHARMACEUTICALS:  15.5 mCi of Tc-33m DTPA aerosol inhalation and 1.5 mCi Tc9m MAA IV COMPARISON:  Chest x-ray Oct 03, 2018 FINDINGS: Ventilation: There are multiple bilateral small matched defects. There is a single large matched defect in the right base. Perfusion: There are multiple bilateral small matched defects. There is a single large matched defect in the right base. IMPRESSION: Very low probability V/Q scan. Electronically Signed   By: Dorise Bullion III M.D   On: 10/03/2018 11:17    Scheduled Meds:  feeding supplement (ENSURE ENLIVE)  237 mL Oral BID BM   furosemide  40 mg Intravenous BID   heparin  5,000 Units Subcutaneous Q8H   ipratropium-albuterol  3 mL Nebulization TID   magnesium oxide  400 mg Oral BID   mometasone-formoterol  2 puff Inhalation BID   multivitamin with minerals  1 tablet Oral Daily   nicotine  14 mg Transdermal Q24H   predniSONE  40 mg Oral Q breakfast   spironolactone  12.5 mg Oral Daily    Continuous Infusions:  sodium chloride     sodium chloride 250 mL (10/01/18 2147)     LOS: 7 days     Kayleen Memos, MD [facility] Pager [phone]  If 7PM-7AM, please contact night-coverage [email or web address] Password [facility] 10/04/2018, 10:47 AM

## 2018-10-04 NOTE — Progress Notes (Signed)
Advanced Heart Failure Rounding Note   Subjective:     LE edema continues to improve with TED hose and diuresis. Weight down another 2 pounds. Breathing better. Has persistent cough. No wheezing. No CP or orthopnea. Feels like her memory isn't as good. Isn't sleeping well.   VQ negative  Objective:   Weight Range:  Vital Signs:   Temp:  [98.6 F (37 C)-98.8 F (37.1 C)] 98.8 F (37.1 C) (05/17 0742) Pulse Rate:  [81-93] 82 (05/17 0742) Resp:  [18-20] 18 (05/17 0628) BP: (103-112)/(61-69) 105/69 (05/17 0742) SpO2:  [96 %-99 %] 96 % (05/17 0853) Weight:  [50.5 kg] 50.5 kg (05/17 0628) Last BM Date: 10/01/18  Weight change: Filed Weights   10/02/18 0409 10/03/18 0452 10/04/18 0628  Weight: 53.4 kg 51.6 kg 50.5 kg    Intake/Output:   Intake/Output Summary (Last 24 hours) at 10/04/2018 1144 Last data filed at 10/04/2018 0950 Gross per 24 hour  Intake 1419 ml  Output 2250 ml  Net -831 ml     Physical Exam: General:  Chronically ill appearing  No resp difficulty. Hoarse HEENT: normal Neck: supple. JVP 6-7. Carotids 2+ bilat; no bruits. No lymphadenopathy or thryomegaly appreciated. Cor: PMI nondisplaced. Regular rate & rhythm. No rubs, gallops or murmurs. Lungs: clear with decreased breath sounds throughout Abdomen: soft, nontender, nondistended. No hepatosplenomegaly. No bruits or masses. Good bowel sounds. Extremities: no cyanosis, clubbing, rash, trace edema + TED hose Neuro: alert & orientedx3, cranial nerves grossly intact. moves all 4 extremities w/o difficulty. Affect pleasant   Telemetry: NSR 80-90s Personally reviewed   Labs: Basic Metabolic Panel: Recent Labs  Lab 09/28/18 0412  09/29/18 0248 09/30/18 0332 10/01/18 0556 10/02/18 0557 10/04/18 0507  NA 136   < > 134* 129* 132* 131* 136  K 4.8   < > 4.0 4.1 4.0 4.2 3.8  CL 88*  --  87* 86* 92* 92* 88*  CO2 35*  --  32 34* 31 31 36*  GLUCOSE 134*  --  133* 107* 93 81 69*  BUN 27*  --  36* 29*  24* 21 20  CREATININE 0.71  --  0.54 0.58 0.54 0.57 0.49  CALCIUM 9.1  --  8.5* 8.5* 8.3* 8.3* 8.6*  MG 1.9  --  1.7 1.5* 2.1  --  2.0  PHOS 2.1*  --  3.5 3.2 2.7  --  2.1*   < > = values in this interval not displayed.    Liver Function Tests: Recent Labs  Lab 09/30/18 0332  ALBUMIN 2.5*   No results for input(s): LIPASE, AMYLASE in the last 168 hours. No results for input(s): AMMONIA in the last 168 hours.  CBC: Recent Labs  Lab 09/28/18 0412 09/28/18 1126 09/29/18 0248 09/30/18 0332 10/01/18 0556 10/02/18 0557  WBC 26.1*  --  30.2* 25.0* 17.0* 17.9*  HGB 14.0 15.0 12.8 12.6 12.3 12.7  HCT 45.2 44.0 40.5 38.5 39.0 40.6  MCV 86.4  --  85.6 84.2 85.0 85.5  PLT 246  --  257 249 254 291    Cardiac Enzymes: Recent Labs  Lab 09/28/18 0412  TROPONINI <0.03    BNP: BNP (last 3 results) Recent Labs    09/23/18 1433 09/27/18 0718 09/28/18 0412  BNP 339.1* 1,852.5* 230.6*    ProBNP (last 3 results) No results for input(s): PROBNP in the last 8760 hours.    Other results:  Imaging: Dg Chest 2 View  Result Date: 10/03/2018 CLINICAL DATA:  Recently  discharged for CHF.  Shortness of breath. EXAM: CHEST - 2 VIEW COMPARISON:  Sep 29, 2018 FINDINGS: Small bilateral pleural effusions, right greater than left, with underlying atelectasis. No pneumothorax. The cardiomediastinal silhouette is stable. IMPRESSION: Small bilateral pleural effusions with underlying atelectasis are new in the interval. Electronically Signed   By: Dorise Bullion III M.D   On: 10/03/2018 11:18   Nm Pulmonary Perf And Vent  Result Date: 10/03/2018 CLINICAL DATA:  Suspected pulmonary embolus.  Shortness of breath. EXAM: NUCLEAR MEDICINE VENTILATION - PERFUSION LUNG SCAN TECHNIQUE: Ventilation images were obtained in multiple projections using inhaled aerosol Tc-17m DTPA. Perfusion images were obtained in multiple projections after intravenous injection of Tc-75m MAA. RADIOPHARMACEUTICALS:  15.5  mCi of Tc-36m DTPA aerosol inhalation and 1.5 mCi Tc4m MAA IV COMPARISON:  Chest x-ray Oct 03, 2018 FINDINGS: Ventilation: There are multiple bilateral small matched defects. There is a single large matched defect in the right base. Perfusion: There are multiple bilateral small matched defects. There is a single large matched defect in the right base. IMPRESSION: Very low probability V/Q scan. Electronically Signed   By: Dorise Bullion III M.D   On: 10/03/2018 11:17     Medications:     Scheduled Medications: . enoxaparin (LOVENOX) injection  40 mg Subcutaneous Q24H  . feeding supplement (ENSURE ENLIVE)  237 mL Oral BID BM  . furosemide  40 mg Intravenous BID  . ipratropium-albuterol  3 mL Nebulization TID  . magnesium oxide  400 mg Oral BID  . mometasone-formoterol  2 puff Inhalation BID  . multivitamin with minerals  1 tablet Oral Daily  . nicotine  14 mg Transdermal Q24H  . predniSONE  40 mg Oral Q breakfast  . spironolactone  12.5 mg Oral Daily    Infusions: . sodium chloride    . sodium chloride 250 mL (10/01/18 2147)    PRN Medications: sodium chloride, acetaminophen, albuterol, ipratropium-albuterol, ondansetron (ZOFRAN) IV   Assessment/plan:   1. 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 She has significant LE edema. - Improving with diuresis and O2.  - VQ negative - PFTs with DLCO ordered. Hopefully can be done in am  - Continue TED hose  - Will switch to lasix 20mg  daily and spiro 25 mg daily as her diuretic regimen - O2 supplementation to keep sats >- 90% - Plan RHC in am to quantify PAH - No role for selective pulmonary artery vasodilators at this point given  WHO Group III disease.  - Pulmoanry rehab on d/c   2. Acute on chronic respiratory failure due to advanced COPD - improving - continue O2  3. Acute on chronic diastolic HF - Improving with lasix and spiro. Change lasix to po   4. H/o Remote DVT due to OCPs - LE dopplers negative this admit - VQ negative  5. Hypokalemia - will supp. Increase spiro to 25.   Length of Stay: [place] 10/04/2018, 11:44 AM  Advanced Heart Failure Team Pager [phone] (M-F; 7a - 4p)  Please contact [facility] for night-coverage after hours (4p -7a ) and weekends on [email or web address]

## 2018-10-05 ENCOUNTER — Encounter (HOSPITAL_COMMUNITY)
Admission: EM | Disposition: A | Discharge status: Home / Self Care | Payer: Self-pay | Source: Home / Self Care | Attending: Internal Medicine

## 2018-10-05 ENCOUNTER — Encounter (HOSPITAL_COMMUNITY): Payer: Self-pay | Admitting: Internal Medicine

## 2018-10-05 ENCOUNTER — Inpatient Hospital Stay: Payer: BC Managed Care – PPO | Admitting: Family Medicine

## 2018-10-05 DIAGNOSIS — E876 Hypokalemia: Secondary | ICD-10-CM

## 2018-10-05 HISTORY — PX: RIGHT HEART CATH: CATH118263

## 2018-10-05 LAB — BASIC METABOLIC PANEL
Anion gap: 7 (ref 5–15)
BUN: 21 mg/dL (ref 8–23)
CO2: 37 mmol/L — ABNORMAL HIGH (ref 22–32)
Calcium: 8.4 mg/dL — ABNORMAL LOW (ref 8.9–10.3)
Chloride: 89 mmol/L — ABNORMAL LOW (ref 98–111)
Creatinine, Ser: 0.6 mg/dL (ref 0.44–1.00)
GFR calc Af Amer: >60 mL/min (ref >60)
GFR calc non Af Amer: >60 mL/min (ref >60)
Glucose, Bld: 70 mg/dL (ref 70–99)
Potassium: 3.7 mmol/L (ref 3.5–5.1)
Sodium: 133 mmol/L — ABNORMAL LOW (ref 135–145)

## 2018-10-05 LAB — CBC WITH DIFFERENTIAL/PLATELET
Abs Immature Granulocytes: 0.23 10*3/uL — ABNORMAL HIGH (ref 0.00–0.07)
Basophils Absolute: 0 10*3/uL (ref 0.0–0.1)
Basophils Relative: 0 %
Eosinophils Absolute: 0.2 10*3/uL (ref 0.0–0.5)
Eosinophils Relative: 1 %
HCT: 42.7 % (ref 36.0–46.0)
Hemoglobin: 13.1 g/dL (ref 12.0–15.0)
Immature Granulocytes: 1 %
Lymphocytes Relative: 14 %
Lymphs Abs: 2.7 10*3/uL (ref 0.7–4.0)
MCH: 26.8 pg (ref 26.0–34.0)
MCHC: 30.7 g/dL (ref 30.0–36.0)
MCV: 87.3 fL (ref 80.0–100.0)
Monocytes Absolute: 1.9 10*3/uL — ABNORMAL HIGH (ref 0.1–1.0)
Monocytes Relative: 9 %
Neutro Abs: 15 10*3/uL — ABNORMAL HIGH (ref 1.7–7.7)
Neutrophils Relative %: 75 %
Platelets: 506 10*3/uL — ABNORMAL HIGH (ref 150–400)
RBC: 4.89 MIL/uL (ref 3.87–5.11)
RDW: 15.7 % — ABNORMAL HIGH (ref 11.5–15.5)
WBC: 20 10*3/uL — ABNORMAL HIGH (ref 4.0–10.5)
nRBC: 0 % (ref 0.0–0.2)

## 2018-10-05 SURGERY — RIGHT HEART CATH
Anesthesia: LOCAL

## 2018-10-05 MED ORDER — HEPARIN (PORCINE) IN NACL 1000-0.9 UT/500ML-% IV SOLN
INTRAVENOUS | Status: DC | PRN
  Administered 2018-10-05: 500 mL

## 2018-10-05 MED ORDER — FENTANYL CITRATE (PF) 100 MCG/2ML IJ SOLN
INTRAMUSCULAR | Status: AC
  Filled 2018-10-05: qty 2

## 2018-10-05 MED ORDER — LIDOCAINE HCL (PF) 1 % IJ SOLN
INTRAMUSCULAR | Status: AC
  Filled 2018-10-05: qty 30

## 2018-10-05 MED ORDER — IPRATROPIUM-ALBUTEROL 0.5-2.5 (3) MG/3ML IN SOLN
3.0000 mL | Freq: Two times a day (BID) | RESPIRATORY_TRACT | Status: DC
  Administered 2018-10-05 – 2018-10-06 (×2): 3 mL via RESPIRATORY_TRACT
  Filled 2018-10-05 (×2): qty 3

## 2018-10-05 MED ORDER — LIDOCAINE HCL (PF) 1 % IJ SOLN
INTRAMUSCULAR | Status: DC | PRN
  Administered 2018-10-05: 2 mL

## 2018-10-05 MED ORDER — GUAIFENESIN 100 MG/5ML PO SOLN
5.0000 mL | Freq: Three times a day (TID) | ORAL | Status: DC
  Administered 2018-10-05 – 2018-10-06 (×4): 100 mg via ORAL
  Filled 2018-10-05 (×4): qty 5

## 2018-10-05 MED ORDER — FENTANYL CITRATE (PF) 100 MCG/2ML IJ SOLN
INTRAMUSCULAR | Status: DC | PRN
  Administered 2018-10-05: 25 ug via INTRAVENOUS

## 2018-10-05 MED ORDER — HEPARIN (PORCINE) IN NACL 1000-0.9 UT/500ML-% IV SOLN
INTRAVENOUS | Status: AC
  Filled 2018-10-05: qty 500

## 2018-10-05 MED ORDER — MIDAZOLAM HCL 2 MG/2ML IJ SOLN
INTRAMUSCULAR | Status: DC | PRN
  Administered 2018-10-05: 1 mg via INTRAVENOUS

## 2018-10-05 MED ORDER — TRAZODONE HCL 50 MG PO TABS
50.0000 mg | ORAL_TABLET | Freq: Every evening | ORAL | Status: DC | PRN
  Administered 2018-10-05: 50 mg via ORAL
  Filled 2018-10-05: qty 1

## 2018-10-05 MED ORDER — MIDAZOLAM HCL 2 MG/2ML IJ SOLN
INTRAMUSCULAR | Status: AC
  Filled 2018-10-05: qty 2

## 2018-10-05 SURGICAL SUPPLY — 8 items
CATH BALLN WEDGE 5F 110CM (CATHETERS) ×2 IMPLANT
COVER DOME SNAP 22 D (MISCELLANEOUS) ×2 IMPLANT
KIT HEART LEFT (KITS) ×2 IMPLANT
PACK CARDIAC CATHETERIZATION (CUSTOM PROCEDURE TRAY) ×2 IMPLANT
SHEATH GLIDE SLENDER 4/5FR (SHEATH) ×2 IMPLANT
TRANSDUCER W/STOPCOCK (MISCELLANEOUS) ×2 IMPLANT
TUBING ART PRESS 72  MALE/FEM (TUBING) ×1
TUBING ART PRESS 72 MALE/FEM (TUBING) ×1 IMPLANT

## 2018-10-05 NOTE — Progress Notes (Signed)
SATURATION QUALIFICATIONS: (This note is used to comply with regulatory documentation for home oxygen)  Patient Saturations on Room Air at Rest = 93%  Patient Saturations on Room Air while Ambulating = 87%  Patient Saturations on 2 Liters of oxygen while Ambulating = 92%  Please briefly explain why patient needs home oxygen:pt with desaturation with all activity and requires supplemental oxygen Evelyn Hughes [name], [place] Pager: [phone] Office: [phone]

## 2018-10-05 NOTE — Progress Notes (Addendum)
Physical Therapy Treatment Patient Details Name: Evelyn Hughes MRN: [id number] DOB: June 08, 1955 Today's Date: 10/05/2018    History of Present Illness Pt is 63 yo female, recently discharged for CHF, COPD with h/o tobacco abuse who returns 5/4 due to declining respiratory status with new confusion. Required reintubation in ED. Extubated 09/29/18.      PT Comments    Pt pleasant but confused with decreased awareness and problem solving able to tolerate increased gait but with noted RLE edema and weakness with assist required to complete long arc quads. Pt awaiting cath today and anxious about when they will arrive for transport. Pt educated for safety, HEP and gait with 24hr supervision need for safety for return home.   SPO2 93% on Ra at rest with drop to 87% with gait, 92% on 2L with gait HR 90-100    Follow Up Recommendations  No PT follow up;Supervision/Assistance - 24 hour     Equipment Recommendations  None recommended by PT    Recommendations for Other Services Speech consult for cognition     Precautions / Restrictions Precautions Precautions: Fall Restrictions Weight Bearing Restrictions: No    Mobility  Bed Mobility Overal bed mobility: Needs Assistance Bed Mobility: Supine to Sit     Supine to sit: Supervision     General bed mobility comments: HOB 30 degrees for transition to and from sitting EOB, supervision for safety  Transfers Overall transfer level: Needs assistance   Transfers: Sit to/from Stand Sit to Stand: Min guard         General transfer comment: cues for hand placement and safety  Ambulation/Gait Ambulation/Gait assistance: Min guard Gait Distance (Feet): 400 Feet Assistive device: Rolling walker (2 wheeled) Gait Pattern/deviations: Step-through pattern;Decreased stride length   Gait velocity interpretation: >2.62 ft/sec, indicative of community ambulatory General Gait Details: cues for direction, safety, position in RW and avoiding  obstacles during gait   Stairs             Wheelchair Mobility    Modified Rankin (Stroke Patients Only)       Balance Overall balance assessment: Needs assistance   Sitting balance-Leahy Scale: Good Sitting balance - Comments: able to sit EOB without assist   Standing balance support: Bilateral upper extremity supported Standing balance-Leahy Scale: Fair                              Cognition Arousal/Alertness: Awake/alert Behavior During Therapy: WFL for tasks assessed/performed Overall Cognitive Status: Impaired/Different from baseline Area of Impairment: Memory;Attention;Safety/judgement;Following commands;Problem solving                   Current Attention Level: Sustained Memory: Decreased short-term memory Following Commands: Follows one step commands with increased time Safety/Judgement: Decreased awareness of deficits;Decreased awareness of safety   Problem Solving: Slow processing;Difficulty sequencing;Requires verbal cues General Comments: Pt unable to recall room number or locate room when given number, pt stating "I think so" to all questions for prior function and spouse assist. Pt walking in hall stated "did my hair change color?" apparently related to misunderstanding a passing comment in the hall but no awareness of whether this could be accurate      Exercises General Exercises - Lower Extremity Long Arc Quad: AROM;AAROM;10 reps;Seated;Right;Left(AAROM on RLE due to pain and weakness) Hip Flexion/Marching: AROM;10 reps;Seated;Both Toe Raises: AROM;20 reps;Both;Seated Heel Raises: AROM;20 reps;Seated;Both    General Comments        Pertinent  Vitals/Pain Pain Assessment: 0-10 Pain Score: 4  Pain Location: right thigh with activity Pain Descriptors / Indicators: Aching;Discomfort Pain Intervention(s): Limited activity within patient's tolerance;Monitored during session;Repositioned    Home Living                       Prior Function            PT Goals (current goals can now be found in the care plan section) Progress towards PT goals: Progressing toward goals    Frequency           PT Plan Current plan remains appropriate    Co-evaluation              AM-PAC PT "6 Clicks" Mobility   Outcome Measure  Help needed turning from your back to your side while in a flat bed without using bedrails?: None Help needed moving from lying on your back to sitting on the side of a flat bed without using bedrails?: A Little Help needed moving to and from a bed to a chair (including a wheelchair)?: A Little Help needed standing up from a chair using your arms (e.g., wheelchair or bedside chair)?: A Little Help needed to walk in hospital room?: A Little Help needed climbing 3-5 steps with a railing? : A Lot 6 Click Score: 18    End of Session Equipment Utilized During Treatment: Gait belt;Oxygen Activity Tolerance: Patient tolerated treatment well Patient left: in bed;with call bell/phone within reach Nurse Communication: Mobility status PT Visit Diagnosis: Unsteadiness on feet (R26.81);Muscle weakness (generalized) (M62.81)     Time: 2633-3545 PT Time Calculation (min) (ACUTE ONLY): 18 min  Charges:  $Gait Training: 8-22 mins                     Quamir Willemsen [name], PT Acute Rehabilitation Services Pager: [phone] Office: [place] 10/05/2018, 12:01 PM

## 2018-10-05 NOTE — Progress Notes (Signed)
PROGRESS NOTE  Evelyn Hughes VFI:[id number] DOB: 1956/03/04 DOA: 09/27/2018 PCP: Patient, No Pcp Per  HPI/Recap of past 24 hours: The patient is an ill-appearing 63 year old female, current every day smoker recently admitted to the hospital ( 5/4-5/8)  and ultimately diagnosed with a combination of what was likely cor pulmonale and acute hypoxic respiratory distress and failure requiring intubation. She self extubated and was ultimately placed on nasal cannula and discharged home with oxygen. Per the husband's report and per EMS report the patient has had a gradual decline especially over the last 12 to 24 hours refusing to eat or drink with AMS. Husband called 11, Paramedics found the patient to be hypoxic, Increased her oxygen and transported her to the ED. She was intubated within 30 minutes of arrival. PCCM have been asked to admit to ICU  and manage care.  [facility] assumed care on 09/30/2018.  10/05/18: Seen and examined at her bedside.  No acute events overnight.  RHC planned today.  Denies chest pain, palpitations or dyspnea.  Bilateral lower extremity edema improving but persistent.  TED hose in place.    Assessment/Plan: Active Problems:   Respiratory failure with hypoxia and hypercapnia (HCC)   Pressure injury of skin  Resolving acute hypoxic hypercarbic respiratory failure, multifactorial secondary to acute volume overload, bilateral pleural effusion, COPD exacerbation Extubated on 09/29/2018 Currently on 2 L of oxygen by nasal cannula and saturating 97% Completed 5 days of IV cefepime Independent reviewed chest x-ray done on 10/03/2018 which showed small bilateral pleural effusions and right lower lobe atelectasis Patient encouraged to use incentive spirometer. 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 Heart failure team is following Continue diuresis as recommended by  heart failure team Continue TED hose per heart failure team V/Q showed very low probability PFTs with DLCO pending Pulmonary rehab on discharge Net I&O -2.5 L since admission  Bilateral lower extremity 2+ pitting edema, improving with TED hose Improving with TED hose and diuretics  Bilateral Doppler ultrasound negative for DVT No obvious discoloration or hyperpigmentation  Emphysema COPD exacerbation Counseled on the importance of tobacco use cessation Maintain O2 saturation greater than 90% Continue inhalers  Leukocytosis in the setting of recent CAP and steroid use WBC 20 K Afebrile with no sign of active infective process Repeat CBC tomorrow  Hypervolemic hyponatremia Sodium 133 from 136 on 10/04/2018 from 131 on 10/02/2018 Continue fluid restriction  Resolved hypomagnesemia post repletion  Tobacco use disorder Counseled on tobacco cessation at bedside Continue nicotine patch as needed   Code Status: Full  Family Communication: Will call to update over the phone  Disposition Plan: Discharge to home with home health services when cardiology signs off.   Consultants:  PCCM  Cardiology  Procedures:  Extubation on 09/29/2018  Antimicrobials: Completed course of cefepime  DVT prophylaxis: Subcu Lovenox daily   Objective: Vitals:   10/05/18 0959 10/05/18 1003 10/05/18 1023 10/05/18 1026  BP: 102/62 103/62 (!) 105/56 (!) 102/56  Pulse: 93 96 96 95  Resp: (!) 22 15 15    Temp:   98.3 F (36.8 C)   TempSrc:   Oral   SpO2: 99% 98% 97% 94%  Weight:      Height:        Intake/Output Summary (Last 24 hours) at 10/05/2018 1115 Last data filed at 10/05/2018 0555 Gross per 24 hour  Intake 760 ml  Output 1350 ml  Net -590 ml  Filed Weights   10/03/18 0452 10/04/18 0628 10/05/18 0429  Weight: 51.6 kg 50.5 kg 50.5 kg    Exam:  . General: 63 y.o. year-old female built in no acute distress.  Alert and oriented x3.   . Cardiovascular: Regular rate and  rhythm with no rubs or gallops.  No JVD or thyromegaly noted. Marland Kitchen Respiratory: Mild rales at bases with no wheezes noted.  Poor inspiratory effort.   . Abdomen: Nontender nondistended with normal bowel sounds x4 quadrants.   . Musculoskeletal: Maryln Manuel in place in lower extremities bilaterally . Psychiatry: Mood is appropriate for condition and setting.  Data Reviewed: CBC: Recent Labs  Lab 09/29/18 0248 09/30/18 0332 10/01/18 0556 10/02/18 0557 10/05/18 0538  WBC 30.2* 25.0* 17.0* 17.9* 20.0*  NEUTROABS  --   --   --   --  15.0*  HGB 12.8 12.6 12.3 12.7 13.1  HCT 40.5 38.5 39.0 40.6 42.7  MCV 85.6 84.2 85.0 85.5 87.3  PLT 257 249 254 291 939*   Basic Metabolic Panel: Recent Labs  Lab 09/29/18 0248 09/30/18 0332 10/01/18 0556 10/02/18 0557 10/04/18 0507 10/05/18 0538  NA 134* 129* 132* 131* 136 133*  K 4.0 4.1 4.0 4.2 3.8 3.7  CL 87* 86* 92* 92* 88* 89*  CO2 32 34* 31 31 36* 37*  GLUCOSE 133* 107* 93 81 69* 70  BUN 36* 29* 24* 21 20 21   CREATININE 0.54 0.58 0.54 0.57 0.49 0.60  CALCIUM 8.5* 8.5* 8.3* 8.3* 8.6* 8.4*  MG 1.7 1.5* 2.1  --  2.0  --   PHOS 3.5 3.2 2.7  --  2.1*  --    GFR: Estimated Creatinine Clearance: 58.1 mL/min (by C-G formula based on SCr of 0.6 mg/dL). Liver Function Tests: Recent Labs  Lab 09/30/18 0332  ALBUMIN 2.5*   No results for input(s): LIPASE, AMYLASE in the last 168 hours. No results for input(s): AMMONIA in the last 168 hours. Coagulation Profile: No results for input(s): INR, PROTIME in the last 168 hours. Cardiac Enzymes: No results for input(s): CKTOTAL, CKMB, CKMBINDEX, TROPONINI in the last 168 hours. BNP (last 3 results) No results for input(s): PROBNP in the last 8760 hours. HbA1C: No results for input(s): HGBA1C in the last 72 hours. CBG: Recent Labs  Lab 09/29/18 0011 09/29/18 0411 09/29/18 0812 09/29/18 1159 09/29/18 1554  GLUCAP 119* 133* 116* 108* 122*   Lipid Profile: No results for input(s): CHOL, HDL,  LDLCALC, TRIG, CHOLHDL, LDLDIRECT in the last 72 hours. Thyroid Function Tests: No results for input(s): TSH, T4TOTAL, FREET4, T3FREE, THYROIDAB in the last 72 hours. Anemia Panel: No results for input(s): VITAMINB12, FOLATE, FERRITIN, TIBC, IRON, RETICCTPCT in the last 72 hours. Urine analysis:    Component Value Date/Time   COLORURINE AMBER (A) 09/27/2018 0820   APPEARANCEUR CLEAR 09/27/2018 0820   LABSPEC 1.019 09/27/2018 0820   PHURINE 6.0 09/27/2018 0820   GLUCOSEU NEGATIVE 09/27/2018 0820   HGBUR NEGATIVE 09/27/2018 0820   BILIRUBINUR NEGATIVE 09/27/2018 0820   KETONESUR 5 (A) 09/27/2018 0820   PROTEINUR 30 (A) 09/27/2018 0820   NITRITE NEGATIVE 09/27/2018 0820   LEUKOCYTESUR NEGATIVE 09/27/2018 0820   Sepsis Labs: @LABRCNTIP (procalcitonin:4,lacticidven:4)  ) Recent Results (from the past 240 hour(s))  SARS Coronavirus 2 (CEPHEID - Performed in [place] hospital lab), Hosp Order     Status: None   Collection Time: 09/27/18  7:00 AM  Result Value Ref Range Status   SARS Coronavirus 2 NEGATIVE NEGATIVE Final  Comment: (NOTE) If result is NEGATIVE SARS-CoV-2 target nucleic acids are NOT DETECTED. The SARS-CoV-2 RNA is generally detectable in upper and lower  respiratory specimens during the acute phase of infection. The lowest  concentration of SARS-CoV-2 viral copies this assay can detect is 250  copies / mL. A negative result does not preclude SARS-CoV-2 infection  and should not be used as the sole basis for treatment or other  patient management decisions.  A negative result may occur with  improper specimen collection / handling, submission of specimen other  than nasopharyngeal swab, presence of viral mutation(s) within the  areas targeted by this assay, and inadequate number of viral copies  (<250 copies / mL). A negative result must be combined with clinical  observations, patient history, and epidemiological information. If result is POSITIVE SARS-CoV-2  target nucleic acids are DETECTED. The SARS-CoV-2 RNA is generally detectable in upper and lower  respiratory specimens dur ing the acute phase of infection.  Positive  results are indicative of active infection with SARS-CoV-2.  Clinical  correlation with patient history and other diagnostic information is  necessary to determine patient infection status.  Positive results do  not rule out bacterial infection or co-infection with other viruses. If result is PRESUMPTIVE POSTIVE SARS-CoV-2 nucleic acids MAY BE PRESENT.   A presumptive positive result was obtained on the submitted specimen  and confirmed on repeat testing.  While 2019 novel coronavirus  (SARS-CoV-2) nucleic acids may be present in the submitted sample  additional confirmatory testing may be necessary for epidemiological  and / or clinical management purposes  to differentiate between  SARS-CoV-2 and other Sarbecovirus currently known to infect humans.  If clinically indicated additional testing with an alternate test  methodology [phone]) is advised. The SARS-CoV-2 RNA is generally  detectable in upper and lower respiratory sp ecimens during the acute  phase of infection. The expected result is Negative. Fact Sheet for Patients:  StrictlyIdeas.no Fact Sheet for Healthcare Providers: BankingDealers.co.za This test is not yet approved or cleared by the [place] FDA and has been authorized for detection and/or diagnosis of SARS-CoV-2 by FDA under an Emergency Use Authorization (EUA).  This EUA will remain in effect (meaning this test can be used) for the duration of the COVID-19 declaration under Section 564(b)(1) of the Act, 21 U.S.C. section 360bbb-3(b)(1), unless the authorization is terminated or revoked sooner. Performed at [facility], [place] [address] [place], [place] [address]   Blood Culture (routine x 2)     Status: None   Collection Time: 09/27/18   7:21 AM  Result Value Ref Range Status   Specimen Description BLOOD RIGHT HAND  Final   Special Requests   Final    BOTTLES DRAWN AEROBIC AND ANAEROBIC Blood Culture adequate volume   Culture   Final    NO GROWTH 5 DAYS Performed at [facility], [place] [address], [place], [place] [address]    Report Status 10/02/2018 FINAL  Final  Blood Culture (routine x 2)     Status: None   Collection Time: 09/27/18  7:26 AM  Result Value Ref Range Status   Specimen Description BLOOD LEFT HAND  Final   Special Requests   Final    BOTTLES DRAWN AEROBIC AND ANAEROBIC Blood Culture adequate volume   Culture   Final    NO GROWTH 5 DAYS Performed at [facility], [place] [address], [place], [place] [address]    Report Status 10/02/2018 FINAL  Final  Culture, respiratory (tracheal aspirate)     Status: None   Collection Time: 09/27/18 10:02 AM  Result Value Ref Range Status   Specimen Description TRACHEAL ASPIRATE  Final   Special Requests Normal  Final   Gram Stain   Final    FEW WBC PRESENT, PREDOMINANTLY PMN RARE GRAM POSITIVE COCCI IN PAIRS    Culture   Final    RARE Consistent with normal respiratory flora. Performed at [facility], [place] [address] [place], [place] [address]    Report Status 09/30/2018 FINAL  Final  MRSA PCR Screening     Status: None   Collection Time: 09/27/18 11:48 AM  Result Value Ref Range Status   MRSA by PCR NEGATIVE NEGATIVE Final    Comment:        The GeneXpert MRSA Assay (FDA approved for NASAL specimens only), is one component of a comprehensive MRSA colonization surveillance program. It is not intended to diagnose MRSA infection nor to guide or monitor treatment for MRSA infections. Performed at [facility], [place] [address] [place], [place] [address]   Culture, Urine     Status: None   Collection Time: 09/27/18  5:13 PM  Result Value Ref Range Status   Specimen Description URINE, CLEAN CATCH  Final   Special Requests NONE   Final   Culture   Final    NO GROWTH Performed at [facility], [place] [address] [place], [place] [address]    Report Status 09/28/2018 FINAL  Final      Studies: No results found.  Scheduled Meds: . enoxaparin (LOVENOX) injection  40 mg Subcutaneous Q24H  . feeding supplement (ENSURE ENLIVE)  237 mL Oral BID BM  . furosemide  20 mg Oral Daily  . guaiFENesin  5 mL Oral TID  . ipratropium-albuterol  3 mL Nebulization BID  . magnesium oxide  400 mg Oral BID  . mometasone-formoterol  2 puff Inhalation BID  . multivitamin with minerals  1 tablet Oral Daily  . nicotine  14 mg Transdermal Q24H    Continuous Infusions: . sodium chloride 250 mL (10/05/18 0555)  . sodium chloride 250 mL (10/01/18 2147)     LOS: 8 days     Kayleen Memos, MD [facility] Pager [phone]  If 7PM-7AM, please contact night-coverage [email or web address] Password [email or web address] 10/05/2018, 11:15 AM

## 2018-10-05 NOTE — Progress Notes (Signed)
Patient c/o not feeling good BP is stable temp is 99.3 MD notified , MD said will give patient antibiotic, report given to night shift RN.

## 2018-10-05 NOTE — Progress Notes (Signed)
Advanced Heart Failure Rounding Note   Subjective:     Diuresing well. No CP or SOB. Now on po lasix. Weight stable   VQ negative  Objective:   Weight Range:  Vital Signs:   Temp:  [98.5 F (36.9 C)-98.6 F (37 C)] 98.6 F (37 C) (05/18 0429) Pulse Rate:  [90-93] 90 (05/18 0429) Resp:  [18-20] 18 (05/18 0429) BP: (115)/(68-74) 115/68 (05/18 0429) SpO2:  [95 %-98 %] 98 % (05/18 0821) Weight:  [50.5 kg] 50.5 kg (05/18 0429) Last BM Date: 10/04/18  Weight change: Filed Weights   10/03/18 0452 10/04/18 0628 10/05/18 0429  Weight: 51.6 kg 50.5 kg 50.5 kg    Intake/Output:   Intake/Output Summary (Last 24 hours) at 10/05/2018 0943 Last data filed at 10/05/2018 0555 Gross per 24 hour  Intake 760 ml  Output 1650 ml  Net -890 ml     Physical Exam: General:  Chronically ill appearing  No resp difficulty. Hoarse HEENT: normal Neck: supple. JVP 7. Carotids 2+ bilat; no bruits. No lymphadenopathy or thryomegaly appreciated. Cor: PMI nondisplaced. Regular rate & rhythm. No rubs, gallops or murmurs. Lungs: clear with decreased BS throughout Abdomen: soft, nontender, nondistended. No hepatosplenomegaly. No bruits or masses. Good bowel sounds. Extremities: no cyanosis, clubbing, rash, edema Neuro: alert & orientedx3, cranial nerves grossly intact. moves all 4 extremities w/o difficulty. Affect pleasant   Telemetry: NSR 80-90s Personally reviewed   Labs: Basic Metabolic Panel: Recent Labs  Lab 09/29/18 0248 09/30/18 0332 10/01/18 0556 10/02/18 0557 10/04/18 0507 10/05/18 0538  NA 134* 129* 132* 131* 136 133*  K 4.0 4.1 4.0 4.2 3.8 3.7  CL 87* 86* 92* 92* 88* 89*  CO2 32 34* 31 31 36* 37*  GLUCOSE 133* 107* 93 81 69* 70  BUN 36* 29* 24* 21 20 21   CREATININE 0.54 0.58 0.54 0.57 0.49 0.60  CALCIUM 8.5* 8.5* 8.3* 8.3* 8.6* 8.4*  MG 1.7 1.5* 2.1  --  2.0  --   PHOS 3.5 3.2 2.7  --  2.1*  --     Liver Function Tests: Recent Labs  Lab 09/30/18 0332  ALBUMIN  2.5*   No results for input(s): LIPASE, AMYLASE in the last 168 hours. No results for input(s): AMMONIA in the last 168 hours.  CBC: Recent Labs  Lab 09/29/18 0248 09/30/18 0332 10/01/18 0556 10/02/18 0557 10/05/18 0538  WBC 30.2* 25.0* 17.0* 17.9* 20.0*  NEUTROABS  --   --   --   --  15.0*  HGB 12.8 12.6 12.3 12.7 13.1  HCT 40.5 38.5 39.0 40.6 42.7  MCV 85.6 84.2 85.0 85.5 87.3  PLT 257 249 254 291 506*    Cardiac Enzymes: No results for input(s): CKTOTAL, CKMB, CKMBINDEX, TROPONINI in the last 168 hours.  BNP: BNP (last 3 results) Recent Labs    09/23/18 1433 09/27/18 0718 09/28/18 0412  BNP 339.1* 1,852.5* 230.6*    ProBNP (last 3 results) No results for input(s): PROBNP in the last 8760 hours.    Other results:  Imaging: Dg Chest 2 View  Result Date: 10/03/2018 CLINICAL DATA:  Recently discharged for CHF.  Shortness of breath. EXAM: CHEST - 2 VIEW COMPARISON:  Sep 29, 2018 FINDINGS: Small bilateral pleural effusions, right greater than left, with underlying atelectasis. No pneumothorax. The cardiomediastinal silhouette is stable. IMPRESSION: Small bilateral pleural effusions with underlying atelectasis are new in the interval. Electronically Signed   By: Dorise Bullion III M.D   On: 10/03/2018 11:18  Nm Pulmonary Perf And Vent  Result Date: 10/03/2018 CLINICAL DATA:  Suspected pulmonary embolus.  Shortness of breath. EXAM: NUCLEAR MEDICINE VENTILATION - PERFUSION LUNG SCAN TECHNIQUE: Ventilation images were obtained in multiple projections using inhaled aerosol Tc-94m DTPA. Perfusion images were obtained in multiple projections after intravenous injection of Tc-7m MAA. RADIOPHARMACEUTICALS:  15.5 mCi of Tc-81m DTPA aerosol inhalation and 1.5 mCi Tc60m MAA IV COMPARISON:  Chest x-ray Oct 03, 2018 FINDINGS: Ventilation: There are multiple bilateral small matched defects. There is a single large matched defect in the right base. Perfusion: There are multiple  bilateral small matched defects. There is a single large matched defect in the right base. IMPRESSION: Very low probability V/Q scan. Electronically Signed   By: Dorise Bullion III M.D   On: 10/03/2018 11:17     Medications:     Scheduled Medications: . [MAR Hold] enoxaparin (LOVENOX) injection  40 mg Subcutaneous Q24H  . [MAR Hold] feeding supplement (ENSURE ENLIVE)  237 mL Oral BID BM  . [MAR Hold] furosemide  20 mg Oral Daily  . [MAR Hold] guaiFENesin  5 mL Oral TID  . [MAR Hold] ipratropium-albuterol  3 mL Nebulization BID  . [MAR Hold] magnesium oxide  400 mg Oral BID  . [MAR Hold] mometasone-formoterol  2 puff Inhalation BID  . [MAR Hold] multivitamin with minerals  1 tablet Oral Daily  . [MAR Hold] nicotine  14 mg Transdermal Q24H  . sodium chloride flush  3 mL Intravenous Q12H  . spironolactone  25 mg Oral Daily    Infusions: . sodium chloride 250 mL (10/05/18 0555)  . [MAR Hold] sodium chloride 250 mL (10/01/18 2147)  . sodium chloride    . sodium chloride      PRN Medications: [MAR Hold] sodium chloride, sodium chloride, [MAR Hold] acetaminophen, [MAR Hold] albuterol, Heparin (Porcine) in NaCl, [MAR Hold] ipratropium-albuterol, [MAR Hold] ondansetron (ZOFRAN) IV, sodium chloride flush, [MAR Hold] traZODone   Assessment/plan:   1. 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 She has significant LE edema. - Improving with diuresis and O2.  - VQ negative - PFTs with DLCO ordered. Hopefully can be done prior to d/c - Continue TED hose  - Continue lasix 20mg  daily and spiro 25 mg daily as her diuretic regimen - O2 supplementation to keep sats >- 90% - Plan RHC today  - No role for selective pulmonary artery vasodilators at this point given WHO Group III disease.  - Pulmoanry rehab on d/c   2. Acute on chronic respiratory failure due to advanced COPD - improving - continue O2  3. Acute on chronic diastolic HF - Improving with lasix and spiro. Change lasix to po   4. H/o Remote DVT due to OCPs - LE dopplers negative this admit - VQ negative  5. Hypokalemia - K 3.7 will supp. Increase spiro to 25.   Length of Stay: 8  Likely ok for d/c from our standpoint pending results of [place] today.     10/05/2018, 9:43 AM  Advanced Heart Failure Team Pager [phone] (M-F; 7a - 4p)  Please contact [facility] for night-coverage after hours (4p -7a ) and weekends on [email or web address]

## 2018-10-05 NOTE — Interval H&P Note (Signed)
History and Physical Interval Note:  10/05/2018 9:46 AM  Evelyn Hughes  has presented today for surgery, with the diagnosis of pulmonary HTN.  The various methods of treatment have been discussed with the patient and family. After consideration of risks, benefits and other options for treatment, the patient has consented to  Procedure(s): RIGHT HEART CATH (N/A) as a surgical intervention.  The patient's history has been reviewed, patient examined, no change in status, stable for surgery.  I have reviewed the patient's chart and labs.  Questions were answered to the patient's satisfaction.     Ardie Dragoo

## 2018-10-05 NOTE — H&P (View-Only) (Signed)
Advanced Heart Failure Rounding Note   Subjective:     Diuresing well. No CP or SOB. Now on po lasix. Weight stable   VQ negative  Objective:   Weight Range:  Vital Signs:   Temp:  [98.5 F (36.9 C)-98.6 F (37 C)] 98.6 F (37 C) (05/18 0429) Pulse Rate:  [90-93] 90 (05/18 0429) Resp:  [18-20] 18 (05/18 0429) BP: (115)/(68-74) 115/68 (05/18 0429) SpO2:  [95 %-98 %] 98 % (05/18 0821) Weight:  [50.5 kg] 50.5 kg (05/18 0429) Last BM Date: 10/04/18  Weight change: Filed Weights   10/03/18 0452 10/04/18 0628 10/05/18 0429  Weight: 51.6 kg 50.5 kg 50.5 kg    Intake/Output:   Intake/Output Summary (Last 24 hours) at 10/05/2018 0943 Last data filed at 10/05/2018 0555 Gross per 24 hour  Intake 760 ml  Output 1650 ml  Net -890 ml     Physical Exam: General:  Chronically ill appearing  No resp difficulty. Hoarse HEENT: normal Neck: supple. JVP 7. Carotids 2+ bilat; no bruits. No lymphadenopathy or thryomegaly appreciated. Cor: PMI nondisplaced. Regular rate & rhythm. No rubs, gallops or murmurs. Lungs: clear with decreased BS throughout Abdomen: soft, nontender, nondistended. No hepatosplenomegaly. No bruits or masses. Good bowel sounds. Extremities: no cyanosis, clubbing, rash, edema Neuro: alert & orientedx3, cranial nerves grossly intact. moves all 4 extremities w/o difficulty. Affect pleasant   Telemetry: NSR 80-90s Personally reviewed   Labs: Basic Metabolic Panel: Recent Labs  Lab 09/29/18 0248 09/30/18 0332 10/01/18 0556 10/02/18 0557 10/04/18 0507 10/05/18 0538  NA 134* 129* 132* 131* 136 133*  K 4.0 4.1 4.0 4.2 3.8 3.7  CL 87* 86* 92* 92* 88* 89*  CO2 32 34* 31 31 36* 37*  GLUCOSE 133* 107* 93 81 69* 70  BUN 36* 29* 24* 21 20 21   CREATININE 0.54 0.58 0.54 0.57 0.49 0.60  CALCIUM 8.5* 8.5* 8.3* 8.3* 8.6* 8.4*  MG 1.7 1.5* 2.1  --  2.0  --   PHOS 3.5 3.2 2.7  --  2.1*  --     Liver Function Tests: Recent Labs  Lab 09/30/18 0332  ALBUMIN  2.5*   No results for input(s): LIPASE, AMYLASE in the last 168 hours. No results for input(s): AMMONIA in the last 168 hours.  CBC: Recent Labs  Lab 09/29/18 0248 09/30/18 0332 10/01/18 0556 10/02/18 0557 10/05/18 0538  WBC 30.2* 25.0* 17.0* 17.9* 20.0*  NEUTROABS  --   --   --   --  15.0*  HGB 12.8 12.6 12.3 12.7 13.1  HCT 40.5 38.5 39.0 40.6 42.7  MCV 85.6 84.2 85.0 85.5 87.3  PLT 257 249 254 291 506*    Cardiac Enzymes: No results for input(s): CKTOTAL, CKMB, CKMBINDEX, TROPONINI in the last 168 hours.  BNP: BNP (last 3 results) Recent Labs    09/23/18 1433 09/27/18 0718 09/28/18 0412  BNP 339.1* 1,852.5* 230.6*    ProBNP (last 3 results) No results for input(s): PROBNP in the last 8760 hours.    Other results:  Imaging: Dg Chest 2 View  Result Date: 10/03/2018 CLINICAL DATA:  Recently discharged for CHF.  Shortness of breath. EXAM: CHEST - 2 VIEW COMPARISON:  Sep 29, 2018 FINDINGS: Small bilateral pleural effusions, right greater than left, with underlying atelectasis. No pneumothorax. The cardiomediastinal silhouette is stable. IMPRESSION: Small bilateral pleural effusions with underlying atelectasis are new in the interval. Electronically Signed   By: Dorise Bullion III M.D   On: 10/03/2018 11:18  Nm Pulmonary Perf And Vent  Result Date: 10/03/2018 CLINICAL DATA:  Suspected pulmonary embolus.  Shortness of breath. EXAM: NUCLEAR MEDICINE VENTILATION - PERFUSION LUNG SCAN TECHNIQUE: Ventilation images were obtained in multiple projections using inhaled aerosol Tc-48m DTPA. Perfusion images were obtained in multiple projections after intravenous injection of Tc-16m MAA. RADIOPHARMACEUTICALS:  15.5 mCi of Tc-51m DTPA aerosol inhalation and 1.5 mCi Tc18m MAA IV COMPARISON:  Chest x-ray Oct 03, 2018 FINDINGS: Ventilation: There are multiple bilateral small matched defects. There is a single large matched defect in the right base. Perfusion: There are multiple  bilateral small matched defects. There is a single large matched defect in the right base. IMPRESSION: Very low probability V/Q scan. Electronically Signed   By: Dorise Bullion III M.D   On: 10/03/2018 11:17     Medications:     Scheduled Medications: . [MAR Hold] enoxaparin (LOVENOX) injection  40 mg Subcutaneous Q24H  . [MAR Hold] feeding supplement (ENSURE ENLIVE)  237 mL Oral BID BM  . [MAR Hold] furosemide  20 mg Oral Daily  . [MAR Hold] guaiFENesin  5 mL Oral TID  . [MAR Hold] ipratropium-albuterol  3 mL Nebulization BID  . [MAR Hold] magnesium oxide  400 mg Oral BID  . [MAR Hold] mometasone-formoterol  2 puff Inhalation BID  . [MAR Hold] multivitamin with minerals  1 tablet Oral Daily  . [MAR Hold] nicotine  14 mg Transdermal Q24H  . sodium chloride flush  3 mL Intravenous Q12H  . spironolactone  25 mg Oral Daily    Infusions: . sodium chloride 250 mL (10/05/18 0555)  . [MAR Hold] sodium chloride 250 mL (10/01/18 2147)  . sodium chloride    . sodium chloride      PRN Medications: [MAR Hold] sodium chloride, sodium chloride, [MAR Hold] acetaminophen, [MAR Hold] albuterol, Heparin (Porcine) in NaCl, [MAR Hold] ipratropium-albuterol, [MAR Hold] ondansetron (ZOFRAN) IV, sodium chloride flush, [MAR Hold] traZODone   Assessment/plan:   1. 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 She has significant LE edema. - Improving with diuresis and O2.  - VQ negative - PFTs with DLCO ordered. Hopefully can be done prior to d/c - Continue TED hose  - Continue lasix 20mg  daily and spiro 25 mg daily as her diuretic regimen - O2 supplementation to keep sats >- 90% - Plan RHC today  - No role for selective pulmonary artery vasodilators at this point given WHO Group III disease.  - Pulmoanry rehab on d/c   2. Acute on chronic respiratory failure due to advanced COPD - improving - continue O2  3. Acute on chronic diastolic HF - Improving with lasix and spiro. Change lasix to po   4. H/o Remote DVT due to OCPs - LE dopplers negative this admit - VQ negative  5. Hypokalemia - K 3.7 will supp. Increase spiro to 25.   Length of Stay: 8  Likely ok for d/c from our standpoint pending results of [place] today.   Daniel Bensimhon 10/05/2018, 9:43 AM  Advanced Heart Failure Team Pager [phone] (M-F; 7a - 4p)  Please contact [facility] for night-coverage after hours (4p -7a ) and weekends on [email or web address]

## 2018-10-06 LAB — POCT I-STAT EG7
Acid-Base Excess: 12 mmol/L — ABNORMAL HIGH (ref 0.0–2.0)
Acid-Base Excess: 8 mmol/L — ABNORMAL HIGH (ref 0.0–2.0)
Acid-Base Excess: 12 mmol/L — ABNORMAL HIGH (ref 0.0–2.0)
Bicarbonate: 35.3 mmol/L — ABNORMAL HIGH (ref 20.0–28.0)
Bicarbonate: 39.6 mmol/L — ABNORMAL HIGH (ref 20.0–28.0)
Bicarbonate: 39.6 mmol/L — ABNORMAL HIGH (ref 20.0–28.0)
Calcium, Ion: 1 mmol/L — ABNORMAL LOW (ref 1.15–1.40)
Calcium, Ion: 1.11 mmol/L — ABNORMAL LOW (ref 1.15–1.40)
Calcium, Ion: 1.16 mmol/L (ref 1.15–1.40)
HCT: 40 % (ref 36.0–46.0)
HCT: 42 % (ref 36.0–46.0)
HCT: 42 % (ref 36.0–46.0)
Hemoglobin: 13.6 g/dL (ref 12.0–15.0)
Hemoglobin: 14.3 g/dL (ref 12.0–15.0)
Hemoglobin: 14.3 g/dL (ref 12.0–15.0)
O2 Saturation: 79 %
O2 Saturation: 82 %
O2 Saturation: 80 %
Potassium: 3.4 mmol/L — ABNORMAL LOW (ref 3.5–5.1)
Potassium: 3.7 mmol/L (ref 3.5–5.1)
Potassium: 3.9 mmol/L (ref 3.5–5.1)
Sodium: 134 mmol/L — ABNORMAL LOW (ref 135–145)
Sodium: 136 mmol/L (ref 135–145)
Sodium: 133 mmol/L — ABNORMAL LOW (ref 135–145)
TCO2: 37 mmol/L — ABNORMAL HIGH (ref 22–32)
TCO2: 42 mmol/L — ABNORMAL HIGH (ref 22–32)
TCO2: 41 mmol/L — ABNORMAL HIGH (ref 22–32)
pCO2, Ven: 57.8 mmHg (ref 44.0–60.0)
pCO2, Ven: 64.9 mmHg — ABNORMAL HIGH (ref 44.0–60.0)
pCO2, Ven: 63.3 mmHg — ABNORMAL HIGH (ref 44.0–60.0)
pH, Ven: 7.393 (ref 7.250–7.430)
pH, Ven: 7.395 (ref 7.250–7.430)
pH, Ven: 7.404 (ref 7.250–7.430)
pO2, Ven: 45 mmHg (ref 32.0–45.0)
pO2, Ven: 48 mmHg — ABNORMAL HIGH (ref 32.0–45.0)
pO2, Ven: 46 mmHg — ABNORMAL HIGH (ref 32.0–45.0)

## 2018-10-06 LAB — CBC WITH DIFFERENTIAL/PLATELET
Abs Immature Granulocytes: 0.24 10*3/uL — ABNORMAL HIGH (ref 0.00–0.07)
Basophils Absolute: 0 10*3/uL (ref 0.0–0.1)
Basophils Relative: 0 %
Eosinophils Absolute: 0.2 10*3/uL (ref 0.0–0.5)
Eosinophils Relative: 1 %
HCT: 40.2 % (ref 36.0–46.0)
Hemoglobin: 12.3 g/dL (ref 12.0–15.0)
Immature Granulocytes: 1 %
Lymphocytes Relative: 16 %
Lymphs Abs: 2.7 10*3/uL (ref 0.7–4.0)
MCH: 26.9 pg (ref 26.0–34.0)
MCHC: 30.6 g/dL (ref 30.0–36.0)
MCV: 87.8 fL (ref 80.0–100.0)
Monocytes Absolute: 1.9 10*3/uL — ABNORMAL HIGH (ref 0.1–1.0)
Monocytes Relative: 12 %
Neutro Abs: 11.6 10*3/uL — ABNORMAL HIGH (ref 1.7–7.7)
Neutrophils Relative %: 70 %
Platelets: 538 10*3/uL — ABNORMAL HIGH (ref 150–400)
RBC: 4.58 MIL/uL (ref 3.87–5.11)
RDW: 15.9 % — ABNORMAL HIGH (ref 11.5–15.5)
WBC: 16.7 10*3/uL — ABNORMAL HIGH (ref 4.0–10.5)
nRBC: 0 % (ref 0.0–0.2)

## 2018-10-06 LAB — PROCALCITONIN: Procalcitonin: <0.1 ng/mL

## 2018-10-06 LAB — BASIC METABOLIC PANEL
Anion gap: 8 (ref 5–15)
BUN: 18 mg/dL (ref 8–23)
CO2: 34 mmol/L — ABNORMAL HIGH (ref 22–32)
Calcium: 8.3 mg/dL — ABNORMAL LOW (ref 8.9–10.3)
Chloride: 93 mmol/L — ABNORMAL LOW (ref 98–111)
Creatinine, Ser: 0.45 mg/dL (ref 0.44–1.00)
GFR calc Af Amer: >60 mL/min (ref >60)
GFR calc non Af Amer: >60 mL/min (ref >60)
Glucose, Bld: 68 mg/dL — ABNORMAL LOW (ref 70–99)
Potassium: 3.9 mmol/L (ref 3.5–5.1)
Sodium: 135 mmol/L (ref 135–145)

## 2018-10-06 MED ORDER — NICOTINE 14 MG/24HR TD PT24: 14.0000 mg | MEDICATED_PATCH | TRANSDERMAL | 0 refills | Status: DC

## 2018-10-06 MED ORDER — FUROSEMIDE 20 MG PO TABS: 20.0000 mg | ORAL_TABLET | Freq: Every day | ORAL | 0 refills | Status: DC

## 2018-10-06 MED ORDER — AZITHROMYCIN 250 MG PO TABS: 250.0000 mg | ORAL_TABLET | Freq: Every day | ORAL | Status: DC

## 2018-10-06 MED ORDER — ENSURE ENLIVE PO LIQD: 237.0000 mL | Freq: Two times a day (BID) | ORAL | 0 refills | Status: DC

## 2018-10-06 MED ORDER — SPIRONOLACTONE 25 MG PO TABS: 25.0000 mg | ORAL_TABLET | Freq: Every day | ORAL | 0 refills | Status: DC

## 2018-10-06 MED ORDER — ADULT MULTIVITAMIN W/MINERALS CH: 1.0000 | ORAL_TABLET | Freq: Every day | ORAL | 0 refills | Status: DC

## 2018-10-06 MED ORDER — AZITHROMYCIN 500 MG PO TABS
500.0000 mg | ORAL_TABLET | Freq: Every day | ORAL | Status: AC
  Administered 2018-10-06: 500 mg via ORAL
  Filled 2018-10-06: qty 1

## 2018-10-06 MED ORDER — AZITHROMYCIN 250 MG PO TABS: ORAL_TABLET | ORAL | 0 refills | Status: DC

## 2018-10-06 MED ORDER — SPIRONOLACTONE 25 MG PO TABS
25.0000 mg | ORAL_TABLET | Freq: Every day | ORAL | Status: DC
  Administered 2018-10-06: 10:00:00 25 mg via ORAL
  Filled 2018-10-06: qty 1

## 2018-10-06 NOTE — Discharge Summary (Signed)
Discharge Summary  Evelyn Hughes WSF:[id number] DOB: [birth year]  PCP: Patient, No Pcp Per  Admit date: 09/27/2018 Discharge date: 10/06/2018  Time spent: 35 minutes  Recommendations for Outpatient Follow-up:  1. Follow-up with cardiology 2. Follow-up with pulmonary 3. Continue occupational therapy 4. Take medications as prescribed 5. Fall precautions  Discharge Diagnoses:  Active Hospital Problems   Diagnosis Date Noted   Pressure injury of skin 09/28/2018   Respiratory failure with hypoxia and hypercapnia (HCC) 09/27/2018    Resolved Hospital Problems  No resolved problems to display.    Discharge Condition: Stable  Diet recommendation: Heart healthy low-sodium diet.  Vitals:   10/06/18 0847 10/06/18 0848  BP:    Pulse:    Resp:    Temp:    SpO2: 98% 98%    History of present illness:  The patient is an ill-appearing 63 year old female, current every day smoker recently admitted to the hospital ( 5/4-5/8) and ultimately diagnosed with a combination of what was likely cor pulmonale and acute hypoxic respiratory distress and failure requiring intubation. She self extubated and was ultimately placed on nasal cannula and discharged home with oxygen. Per the husband's report and per EMS report the patient has had a gradual decline especially over the last 12 to 24 hours refusing to eat or drink with AMS. Husband called 44, Paramedics found the patient to be hypoxic, Increased her oxygen and transported her to the ED. She was intubated within 30 minutes of arrival. PCCM admitted to ICU and managed care.  [facility] assumed care on 09/30/2018.  Admission 5/4-5/8 with intubation for respiratory failure Re-admission 5/10 for Acute on chronic Respiratory Failure Extubated on 09/29/18  5/4 CT abd/ pelvis >> 1. No acute intra-abdominal process. 2. Small ascites and mild anasarca.  5/4 CT chest >> 1. Moderate right and small left pleural effusions. No pneumonia. 2. Mild  right heart enlargement with dilated main pulmonary artery, suggestive of pulmonary arterial hypertension. 3. Emphysema  4. Aortic atherosclerosis  5/4 CTH >> Minimal frontal and parietal lobe atrophy. Otherwise negative exam.  09/22/2018 Echo The left ventricle has normal systolic function, with an ejection fraction of 55-60%. The cavity size was normal. Left ventricular diastolic function could not be evaluated. The right ventricle has normal systolc function. The cavity was mildly enlarged. Right ventricular systolic pressure is mildly elevated with an estimated pressure of 46.6 mmHg. Right atrial size was mildly dilated. The mitral valve is grossly normal. The tricuspid valve was grossly normal. The aortic valve is tricuspid No stenosis of the aortic valve. Limited study; normal LV function; mild RAE and RVE; moderate RV dysfunction; trace TR; mild pulmonary hypertension.  5/5 Doppler Studies Right: There is no evidence of deep vein thrombosis in the lower extremity. No cystic structure found in the popliteal fossa. Left: There is no evidence of deep vein thrombosis in the lower extremity. No cystic structure found in the popliteal fossa. Micro Data:  5/10 Blood Cultures x 2 5/10 Tracheal Aspirate- GPCs 5/10 Urine Legionella 5/10 Urine Strep 5/10 Covid Negative  Antimicrobials:  Completed Cefepime  Completed IV Vanc   Started on po Azithromycin x 5 days on 10/06/18 for her COPD  10/06/18: Patient seen and examined at her bedside.  No acute events overnight.  RHC was done yesterday.  Patient will follow-up with cardiology outpatient.  She has no new complaints.  She denies chest pain or dyspnea.  Admits to intermittent nonproductive cough with no subjective fevers or chills.  States she feels well.  Bilateral lower extremity edema improving.  TED hose in place.  She wants to go home.  Mild leukocytosis which is improving.  Procalcitonin obtained and is negative.  Started  on p.o. azithromycin x5 days for its anti-inflammatory properties.   On the day of discharge, the patient was hemodynamically stable.  She will need to follow-up with her primary care provider, cardiology, and pulmonology posthospitalization.     Hospital Course:  Active Problems:   Respiratory failure with hypoxia and hypercapnia (HCC)   Pressure injury of skin  Resolving acute hypoxic hypercarbic respiratory failure, multifactorial secondary to acute volume overload, bilateral pleural effusion, COPD exacerbation Intubated on 09/27/2018.  Extubated on 09/29/2018 Currently on 2 L of oxygen by nasal cannula and saturating 97% Completed 5 days of IV cefepime Independent reviewed chest x-ray done on 10/03/2018 which showed small bilateral pleural effusions and right lower lobe atelectasis Patient encouraged to use incentive spirometer. 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 and right ventricular failure Heart failure team is following Continue diuresis as recommended by heart failure team Continue TED hose per heart failure team V/Q showed very low probability PFTs with DLCO pending Pulmonary rehab on discharge Net I&O -2.5 L since admission Continue cardiac medications On Lasix 20 mg daily and spironolactone 25 mg daily as recommended by cardiology Follow-up with cardiology outpatient  Bilateral lower extremity 2+ pitting edema, improving with TED hose Improving with TED hose and diuretics  Bilateral Doppler ultrasound negative for DVT No obvious discoloration or hyperpigmentation  Emphysema COPD exacerbation Counseled on the importance of tobacco use cessation Maintain O2 saturation greater than 90% Continue inhalers Started Z-Pak, azithromycin 250 mg daily  x5 days  Leukocytosis in the setting of recent CAP and steroid use Leukocytosis improving with WBC 16 K from 20 K Procalcitonin is negative Afebrile Continue p.o. azithromycin to 50 mg daily x5 days  Resolved hypovolemic hyponatremia   Resolved hypomagnesemia post repletion  Tobacco use disorder Counseled on tobacco cessation at bedside Continue nicotine patch   Code Status: Full   Consultants:  PCCM  Cardiology  Procedures:  Extubation on 09/29/2018  Antimicrobials: Completed course of cefepime and IV vancomycin.  DVT prophylaxis: Subcu Lovenox daily     Discharge Exam: BP 116/61 (BP Location: Right Arm)    Pulse 98    Temp (!) 97.5 F (36.4 C) (Oral)    Resp 20    Ht 5\' 5"  (1.651 m)    Wt 50.5 kg Comment: A scale   SpO2 98%    BMI 18.52 kg/m   General: 63 y.o. year-old female well developed well nourished in no acute distress.  Alert and oriented x3.  Cardiovascular: Regular rate and rhythm with no rubs or gallops.  No thyromegaly or JVD noted.    Respiratory: Clear to auscultation with no wheezes or rales. Good inspiratory effort.  Abdomen: Soft nontender nondistended with normal bowel sounds x4 quadrants.  Musculoskeletal: TED hose in place, mild lower extremity edema.   Psychiatry: Mood is appropriate for condition and setting  Discharge Instructions You were cared for by a hospitalist during your hospital stay. If you have any questions about your discharge medications or the care you received while you were in the hospital after you are discharged, you can call the unit and asked to speak with the hospitalist on call if the  hospitalist that took care of you is not available. Once you are discharged, your primary care physician will handle any further medical issues. Please note that NO REFILLS for any discharge medications will be authorized once you are discharged, as it is imperative that you return to your primary care physician (or  establish a relationship with a primary care physician if you do not have one) for your aftercare needs so that they can reassess your need for medications and monitor your lab values.   Allergies as of 10/06/2018      Reactions   Sulfa Antibiotics    Penicillins Rash      Medication List    STOP taking these medications   benzonatate 100 MG capsule Commonly known as:  TESSALON   potassium chloride SA 20 MEQ tablet Commonly known as:  K-DUR     TAKE these medications   albuterol 108 (90 Base) MCG/ACT inhaler Commonly known as:  VENTOLIN HFA Inhale 2 puffs into the lungs every 6 (six) hours as needed for wheezing or shortness of breath.   azithromycin 250 MG tablet Commonly known as:  ZITHROMAX Take 1 tablet 250 mg daily x5 days Start taking on:  Oct 07, 2018 What changed:  additional instructions   feeding supplement (ENSURE ENLIVE) Liqd Take 237 mLs by mouth 2 (two) times daily between meals.   Fluticasone-Salmeterol 100-50 MCG/DOSE Aepb Commonly known as:  Advair Diskus Inhale 1 puff into the lungs 2 (two) times daily.   furosemide 20 MG tablet Commonly known as:  LASIX Take 1 tablet (20 mg total) by mouth daily. What changed:  when to take this   magnesium oxide 400 (241.3 Mg) MG tablet Commonly known as:  MAG-OX Take 1 tablet (400 mg total) by mouth 2 (two) times daily for 30 days.   multivitamin with minerals Tabs tablet Take 1 tablet by mouth daily.   nicotine 14 mg/24hr patch Commonly known as:  NICODERM CQ - dosed in mg/24 hours Place 1 patch (14 mg total) onto the skin daily.   spironolactone 25 MG tablet Commonly known as:  ALDACTONE Take 1 tablet (25 mg total) by mouth daily.   tiotropium 18 MCG inhalation capsule Commonly known as:  Spiriva HandiHaler Place 1 capsule (18 mcg total) into inhaler and inhale daily for 30 days.            Durable Medical Equipment  (From admission, onward)         Start     Ordered   10/01/18 1408  For  home use only DME oxygen  Once    Question Answer Comment  Mode or (Route) Nasal cannula   Liters per Minute 2   Frequency Continuous (stationary and portable oxygen unit needed)   Oxygen conserving device Yes   Oxygen delivery system Gas      10/01/18 1408   10/01/18 0542  For home use only DME Bedside commode  Once    Comments:  3 in 1 bedside commode  Question:  Patient needs a bedside commode to treat with the following condition  Answer:  Ambulatory dysfunction   10/01/18 0542         Allergies  Allergen Reactions   Sulfa Antibiotics    Penicillins Rash   Follow-up Information    [facility] Follow up.   Specialty:  [place] Why:  They will do your home health care at your home Contact information: [place] [id number]  [facility] Follow up.   Specialty:  Cardiology Why:  We will call you for a virtual hospital follow up appointment. If you have a smart phone, we will use video. Please have medications and daily weights ready.  Contact information: [address] [id number] mc [place]       Marshell Garfinkel, MD. Call in 1 day(s).   Specialty:  Pulmonary Disease Why:  Please call for a post hospital follow-up appointment Contact information: [place] [address] [phone]        [place]. Call in 1 day(s).   Why:  Please call for a post hospital follow-up appointment. Contact information: [address] [place] [address] [phone]           The results of significant diagnostics from this hospitalization (including imaging, microbiology, ancillary and laboratory) are listed below for reference.    Significant Diagnostic Studies: Ct Abdomen Pelvis Wo Contrast  Result Date: 09/21/2018 CLINICAL DATA:   Shortness of breath and bilateral leg swelling. EXAM: CT CHEST, ABDOMEN AND PELVIS WITHOUT CONTRAST TECHNIQUE: Multidetector CT imaging of the chest, abdomen and pelvis was performed following the standard protocol without IV contrast. COMPARISON:  Chest x-ray from same day. FINDINGS: CT CHEST FINDINGS Cardiovascular: Mild right heart enlargement. No pericardial effusion. No thoracic aortic aneurysm. Coronary, aortic arch, and branch vessel atherosclerotic vascular disease. Dilated main pulmonary artery measuring up to 3.5 cm. Mediastinum/Nodes: No enlarged mediastinal, hilar, or axillary lymph nodes. Thyroid gland, trachea, and esophagus demonstrate no significant findings. Lungs/Pleura: Upper lobe predominant centrilobular and paraseptal emphysema. Moderate right and small left pleural effusions. Linear scarring/atelectasis in the right middle lobe. Right greater than left lower lobe atelectasis. No consolidation or pneumothorax. No suspicious pulmonary nodule. Musculoskeletal: No chest wall mass or suspicious bone lesions identified. CT ABDOMEN PELVIS FINDINGS Hepatobiliary: Scattered hepatic simple cysts measuring up to 5.0 cm. The gallbladder is contracted. No gallstones, gallbladder wall thickening, or biliary dilatation. Pancreas: Unremarkable. No pancreatic ductal dilatation or surrounding inflammatory changes. Spleen: Normal in size without focal abnormality. Adrenals/Urinary Tract: Adrenal glands are unremarkable. Kidneys are normal, without renal calculi, focal lesion, or hydronephrosis. Bladder is unremarkable. Stomach/Bowel: Stomach is within normal limits. Appendix appears normal. No evidence of bowel wall thickening, distention, or inflammatory changes. Vascular/Lymphatic: Aortic atherosclerosis. No enlarged abdominal or pelvic lymph nodes. Reproductive: Uterus and bilateral adnexa are unremarkable. Other: Small ascites. No pneumoperitoneum. No abdominal wall hernia. Musculoskeletal: Mild anasarca. No  acute or significant osseous findings. IMPRESSION: Chest: 1. Moderate right and small left pleural effusions.  No pneumonia. 2. Mild right heart enlargement with dilated main pulmonary artery, suggestive of pulmonary arterial hypertension. 3.  Emphysema (ICD10-J43.9). 4.  Aortic atherosclerosis (ICD10-I70.0). Abdomen and pelvis: 1.  No acute intra-abdominal process. 2. Small ascites and mild anasarca. Electronically Signed   By: Titus Dubin M.D.   On: 09/21/2018 15:38   Dg Chest 2 View  Result Date: 10/03/2018 CLINICAL DATA:  Recently discharged for CHF.  Shortness of breath. EXAM: CHEST - 2 VIEW COMPARISON:  Sep 29, 2018 FINDINGS: Small bilateral pleural effusions, right greater than left, with underlying atelectasis. No pneumothorax. The cardiomediastinal silhouette is stable. IMPRESSION: Small bilateral pleural effusions with underlying atelectasis are new in the interval. Electronically Signed   By: Dorise Bullion III M.D   On: 10/03/2018 11:18   Ct Head  Wo Contrast  Result Date: 09/21/2018 CLINICAL DATA:  Altered level of consciousness. EXAM: CT HEAD WITHOUT CONTRAST TECHNIQUE: Contiguous axial images were obtained from the base of the skull through the vertex without intravenous contrast. COMPARISON:  None. FINDINGS: Brain: There is no evidence of acute infarction or hemorrhage or mass lesion. There is slight frontal and parietal lobe atrophy. No ventricular dilatation. Vascular: No hyperdense vessel or unexpected calcification. Skull: Normal. Negative for fracture or focal lesion. Sinuses/Orbits: Normal. Other: None IMPRESSION: Minimal frontal and parietal lobe atrophy.  Otherwise negative exam. Electronically Signed   By: Lorriane Shire M.D.   On: 09/21/2018 15:24   Ct Chest Wo Contrast  Result Date: 09/21/2018 CLINICAL DATA:  Shortness of breath and bilateral leg swelling. EXAM: CT CHEST, ABDOMEN AND PELVIS WITHOUT CONTRAST TECHNIQUE: Multidetector CT imaging of the chest, abdomen and pelvis  was performed following the standard protocol without IV contrast. COMPARISON:  Chest x-ray from same day. FINDINGS: CT CHEST FINDINGS Cardiovascular: Mild right heart enlargement. No pericardial effusion. No thoracic aortic aneurysm. Coronary, aortic arch, and branch vessel atherosclerotic vascular disease. Dilated main pulmonary artery measuring up to 3.5 cm. Mediastinum/Nodes: No enlarged mediastinal, hilar, or axillary lymph nodes. Thyroid gland, trachea, and esophagus demonstrate no significant findings. Lungs/Pleura: Upper lobe predominant centrilobular and paraseptal emphysema. Moderate right and small left pleural effusions. Linear scarring/atelectasis in the right middle lobe. Right greater than left lower lobe atelectasis. No consolidation or pneumothorax. No suspicious pulmonary nodule. Musculoskeletal: No chest wall mass or suspicious bone lesions identified. CT ABDOMEN PELVIS FINDINGS Hepatobiliary: Scattered hepatic simple cysts measuring up to 5.0 cm. The gallbladder is contracted. No gallstones, gallbladder wall thickening, or biliary dilatation. Pancreas: Unremarkable. No pancreatic ductal dilatation or surrounding inflammatory changes. Spleen: Normal in size without focal abnormality. Adrenals/Urinary Tract: Adrenal glands are unremarkable. Kidneys are normal, without renal calculi, focal lesion, or hydronephrosis. Bladder is unremarkable. Stomach/Bowel: Stomach is within normal limits. Appendix appears normal. No evidence of bowel wall thickening, distention, or inflammatory changes. Vascular/Lymphatic: Aortic atherosclerosis. No enlarged abdominal or pelvic lymph nodes. Reproductive: Uterus and bilateral adnexa are unremarkable. Other: Small ascites. No pneumoperitoneum. No abdominal wall hernia. Musculoskeletal: Mild anasarca. No acute or significant osseous findings. IMPRESSION: Chest: 1. Moderate right and small left pleural effusions.  No pneumonia. 2. Mild right heart enlargement with  dilated main pulmonary artery, suggestive of pulmonary arterial hypertension. 3.  Emphysema (ICD10-J43.9). 4.  Aortic atherosclerosis (ICD10-I70.0). Abdomen and pelvis: 1.  No acute intra-abdominal process. 2. Small ascites and mild anasarca. Electronically Signed   By: Titus Dubin M.D.   On: 09/21/2018 15:38   Nm Pulmonary Perf And Vent  Result Date: 10/03/2018 CLINICAL DATA:  Suspected pulmonary embolus.  Shortness of breath. EXAM: NUCLEAR MEDICINE VENTILATION - PERFUSION LUNG SCAN TECHNIQUE: Ventilation images were obtained in multiple projections using inhaled aerosol Tc-44m DTPA. Perfusion images were obtained in multiple projections after intravenous injection of Tc-10m MAA. RADIOPHARMACEUTICALS:  15.5 mCi of Tc-10m DTPA aerosol inhalation and 1.5 mCi Tc85m MAA IV COMPARISON:  Chest x-ray Oct 03, 2018 FINDINGS: Ventilation: There are multiple bilateral small matched defects. There is a single large matched defect in the right base. Perfusion: There are multiple bilateral small matched defects. There is a single large matched defect in the right base. IMPRESSION: Very low probability V/Q scan. Electronically Signed   By: Dorise Bullion III M.D   On: 10/03/2018 11:17   Dg Chest Port 1 View  Result Date: 09/29/2018 CLINICAL DATA:  63 year old female with respiratory  failure recently negative for COVID-19. EXAM: PORTABLE CHEST 1 VIEW COMPARISON:  09/28/2018 and earlier. FINDINGS: Portable AP semi upright view at 0510 hours. ETT tip in good position between the clavicles and carina. Enteric tube courses to the left upper quadrant, tip not included. Stable mediastinal contours and large lung volumes. Mildly increased veiling opacity at the left lung base. Stable ventilation and mild veiling opacity at the right base. No pneumothorax or pulmonary edema. IMPRESSION: 1. Stable lines and tubes. 2. Large lung volumes with stable right lower lung and new left lung base mild veiling opacity which might  indicate pleural effusions. Electronically Signed   By: Genevie Ann M.D.   On: 09/29/2018 05:44   Dg Chest Port 1 View  Result Date: 09/28/2018 CLINICAL DATA:  63 year old female with respiratory failure. Negative for COVID-19 yesterday. EXAM: PORTABLE CHEST 1 VIEW COMPARISON:  09/27/2018 and earlier. FINDINGS: Portable AP semi upright views at 0326 hours. Stable endotracheal tube tip just below the clavicles. Enteric tube courses to the left upper quadrant, tip not included. Stable large lung volumes. Normal cardiac size and mediastinal contours. No pneumothorax or pulmonary edema. Mildly increased veiling opacity in the right lower lung. Ventilation at the right lung base remains improved since 09/23/2018. No confluent opacity on the left. Paucity of bowel gas. Stable visualized osseous structures. IMPRESSION: 1. Stable lines and tubes. 2. Mildly increased veiling opacity at the right lung base suggestive of increased right pleural effusion. Otherwise stable large lung volumes. Electronically Signed   By: Genevie Ann M.D.   On: 09/28/2018 04:28   Dg Chest Portable 1 View  Result Date: 09/27/2018 CLINICAL DATA:  Status post intubation. EXAM: PORTABLE CHEST 1 VIEW COMPARISON:  09/27/2018 at 0707 hours FINDINGS: An endotracheal tube has been placed and appears well position between the clavicles and carina. An enteric tube has been placed and courses into the abdomen with tip not imaged. The cardiomediastinal silhouette is unchanged. A small right pleural effusion and mild right basilar airspace opacity are unchanged. Known small left pleural effusion is not well visualized as the left costophrenic angle was excluded. No pneumothorax is identified. IMPRESSION: Interval intubation.  Otherwise unchanged examination. Electronically Signed   By: Logan Bores M.D.   On: 09/27/2018 08:12   Dg Chest Portable 1 View  Result Date: 09/27/2018 CLINICAL DATA:  Respiratory distress. EXAM: PORTABLE CHEST 1 VIEW COMPARISON:   09/23/2018 FINDINGS: The cardiomediastinal silhouette is within normal limits for portable AP technique. The lungs are hyperinflated with chronic interstitial coarsening in the setting of known emphysema. Small pleural effusions persist, right slightly larger than left. Mild bibasilar lung opacities remain, however there is improved aeration of the right lung base with the denser consolidative opacity on the prior study having resolved. Mild patchy opacity in the right mid lung has slightly increased. No pneumothorax is identified. No acute osseous abnormality is seen. IMPRESSION: 1. Persistent small bilateral pleural effusions and bibasilar atelectasis. 2. Improved right basilar lung aeration. Electronically Signed   By: Logan Bores M.D.   On: 09/27/2018 07:25   Dg Chest Port 1 View  Result Date: 09/23/2018 CLINICAL DATA:  Leg swelling, shortness of breath EXAM: PORTABLE CHEST 1 VIEW COMPARISON:  09/21/2018 FINDINGS: Interval removal of previously seen endotracheal and esophagogastric tubes. Slightly increased atelectasis or consolidation at the right lung base with small right greater than left pleural effusions. Underlying hyperinflation and emphysema. Cardiomegaly. IMPRESSION: Interval removal of previously seen endotracheal and esophagogastric tubes. Slightly increased atelectasis or consolidation at  the right lung base with small right greater than left pleural effusions. Underlying hyperinflation and emphysema. Cardiomegaly. Electronically Signed   By: Eddie Candle M.D.   On: 09/23/2018 08:17   Dg Chest Port 1 View  Result Date: 09/22/2018 CLINICAL DATA:  63 year old female with pleural effusion EXAM: PORTABLE CHEST 1 VIEW COMPARISON:  09/21/2018 FINDINGS: Cardiomediastinal silhouette unchanged in size and contour. Endotracheal tube terminates suitably above the carina 5.7 cm. Gastric tube projects over the mediastinum and terminates out of the field of view. Background changes of emphysema Veil  opacity in the right chest with partial obscuration of the visualized right hemidiaphragm. Unchanged from prior. Coarsened interstitial markings bilaterally. No pneumothorax. IMPRESSION: Unchanged appearance of the chest x-ray, with unchanged gastric tube and endotracheal tube. Similar appearance of right-sided pleural effusion with associated atelectasis/consolidation. Background of emphysema Electronically Signed   By: Corrie Mckusick D.O.   On: 09/22/2018 10:43   Portable Chest X-ray  Result Date: 09/21/2018 CLINICAL DATA:  Endotracheal tube placement EXAM: PORTABLE CHEST 1 VIEW COMPARISON:  09/21/2018 FINDINGS: Endotracheal tube is 5.5 cm above the carina. Hyperinflation of the lungs. Layering right pleural effusion with right lower lobe airspace opacity. No focal opacity on the left. Heart is borderline in size. IMPRESSION: Endotracheal tube 5.5 cm above the carina. Moderate layering right effusion with right lower lobe atelectasis or infiltrate. Hyperinflation. Electronically Signed   By: Rolm Baptise M.D.   On: 09/21/2018 22:59   Dg Chest Portable 1 View  Result Date: 09/21/2018 CLINICAL DATA:  Shortness of breath, tube placement EXAM: PORTABLE CHEST 1 VIEW COMPARISON:  Portable exam 1957 hours compared to earlier study of 12 T4 hours FINDINGS: Tip of endotracheal tube projects 5.8 cm above carina. Nasogastric tube extends into stomach. Normal heart size, mediastinal contours, and pulmonary vascularity. Atherosclerotic calcification aorta. Emphysematous changes with decreased RIGHT pleural effusion and basilar atelectasis versus previous study. No new infiltrate or pneumothorax. Bones demineralized. IMPRESSION: Tube positions as above. COPD changes with decreased RIGHT pleural effusion and basilar atelectasis since earlier study. Electronically Signed   By: Lavonia Dana M.D.   On: 09/21/2018 20:42   Dg Chest Port 1 View  Result Date: 09/21/2018 CLINICAL DATA:  Onset hypoxia and respiratory distress  today. Lower extremity swelling. EXAM: PORTABLE CHEST 1 VIEW COMPARISON:  PA and lateral chest 03/23/2014. FINDINGS: The chest is hyperexpanded with attenuation of the pulmonary vasculature. There is a small right pleural effusion and basilar airspace disease. The left lung is clear. Heart size is normal. No pneumothorax. Aortic atherosclerosis noted. No acute bony abnormality. IMPRESSION: Right effusion and basilar airspace disease most consistent with pneumonia. Recommend follow-up to clearing. Emphysema. Atherosclerosis. Electronically Signed   By: Inge Rise M.D.   On: 09/21/2018 13:46   Vas [place] Lower Extremity Venous (dvt)  Result Date: 09/22/2018  Lower Venous Study Indications: Swelling.  Performing Technologist: Oliver Hum RVT  Examination Guidelines: A complete evaluation includes B-mode imaging, spectral Doppler, color Doppler, and power Doppler as needed of all accessible portions of each vessel. Bilateral testing is considered an integral part of a complete examination. Limited examinations for reoccurring indications may be performed as noted.  +---------+---------------+---------+-----------+----------+-------+  RIGHT     Compressibility Phasicity Spontaneity Properties Summary  +---------+---------------+---------+-----------+----------+-------+  CFV       Full            Yes       Yes                             +---------+---------------+---------+-----------+----------+-------+  SFJ       Full                                                      +---------+---------------+---------+-----------+----------+-------+  FV Prox   Full                                                      +---------+---------------+---------+-----------+----------+-------+  FV Mid    Full                                                      +---------+---------------+---------+-----------+----------+-------+  FV Distal Full                                                       +---------+---------------+---------+-----------+----------+-------+  PFV       Full                                                      +---------+---------------+---------+-----------+----------+-------+  POP       Full            Yes       Yes                             +---------+---------------+---------+-----------+----------+-------+  PTV       Full                                                      +---------+---------------+---------+-----------+----------+-------+  PERO      Full                                                      +---------+---------------+---------+-----------+----------+-------+   +---------+---------------+---------+-----------+----------+-------+  LEFT      Compressibility Phasicity Spontaneity Properties Summary  +---------+---------------+---------+-----------+----------+-------+  CFV       Full            Yes       Yes                             +---------+---------------+---------+-----------+----------+-------+  SFJ       Full                                                      +---------+---------------+---------+-----------+----------+-------+  FV Prox   Full                                                      +---------+---------------+---------+-----------+----------+-------+  FV Mid    Full                                                      +---------+---------------+---------+-----------+----------+-------+  FV Distal Full                                                      +---------+---------------+---------+-----------+----------+-------+  PFV       Full                                                      +---------+---------------+---------+-----------+----------+-------+  POP       Full            Yes       Yes                             +---------+---------------+---------+-----------+----------+-------+  PTV       Full                                                      +---------+---------------+---------+-----------+----------+-------+  PERO      Full                                                       +---------+---------------+---------+-----------+----------+-------+     Summary: Right: There is no evidence of deep vein thrombosis in the lower extremity. No cystic structure found in the popliteal fossa. Left: There is no evidence of deep vein thrombosis in the lower extremity. No cystic structure found in the popliteal fossa.  *See table(s) above for measurements and observations. Electronically signed by Deitra Mayo MD on 09/22/2018 at 1:58:31 PM.    Final     Microbiology: Recent Results (from the past 240 hour(s))  SARS Coronavirus 2 (CEPHEID - Performed in [facility] hospital lab), Hosp Order     Status: None   Collection Time: 09/27/18  7:00 AM  Result Value Ref Range Status   SARS Coronavirus 2 NEGATIVE NEGATIVE Final    Comment: (NOTE) If result is NEGATIVE SARS-CoV-2 target nucleic acids are NOT DETECTED. The SARS-CoV-2 RNA is generally detectable in upper and lower  respiratory specimens during the acute phase of infection. The lowest  concentration of SARS-CoV-2 viral copies this assay can detect is 250  copies /  mL. A negative result does not preclude SARS-CoV-2 infection  and should not be used as the sole basis for treatment or other  patient management decisions.  A negative result may occur with  improper specimen collection / handling, submission of specimen other  than nasopharyngeal swab, presence of viral mutation(s) within the  areas targeted by this assay, and inadequate number of viral copies  (<250 copies / mL). A negative result must be combined with clinical  observations, patient history, and epidemiological information. If result is POSITIVE SARS-CoV-2 target nucleic acids are DETECTED. The SARS-CoV-2 RNA is generally detectable in upper and lower  respiratory specimens dur ing the acute phase of infection.  Positive  results are indicative of active infection with SARS-CoV-2.  Clinical    correlation with patient history and other diagnostic information is  necessary to determine patient infection status.  Positive results do  not rule out bacterial infection or co-infection with other viruses. If result is PRESUMPTIVE POSTIVE SARS-CoV-2 nucleic acids MAY BE PRESENT.   A presumptive positive result was obtained on the submitted specimen  and confirmed on repeat testing.  While 2019 novel coronavirus  (SARS-CoV-2) nucleic acids may be present in the submitted sample  additional confirmatory testing may be necessary for epidemiological  and / or clinical management purposes  to differentiate between  SARS-CoV-2 and other Sarbecovirus currently known to infect humans.  If clinically indicated additional testing with an alternate test  methodology [phone]) is advised. The SARS-CoV-2 RNA is generally  detectable in upper and lower respiratory sp ecimens during the acute  phase of infection. The expected result is Negative. Fact Sheet for Patients:  StrictlyIdeas.no Fact Sheet for Healthcare Providers: BankingDealers.co.za This test is not yet approved or cleared by the [place] FDA and has been authorized for detection and/or diagnosis of SARS-CoV-2 by FDA under an Emergency Use Authorization (EUA).  This EUA will remain in effect (meaning this test can be used) for the duration of the COVID-19 declaration under Section 564(b)(1) of the Act, 21 U.S.C. section 360bbb-3(b)(1), unless the authorization is terminated or revoked sooner. Performed at [facility], [place] [address] [place], [place] [address]   Blood Culture (routine x 2)     Status: None   Collection Time: 09/27/18  7:21 AM  Result Value Ref Range Status   Specimen Description BLOOD RIGHT HAND  Final   Special Requests   Final    BOTTLES DRAWN AEROBIC AND ANAEROBIC Blood Culture adequate volume   Culture   Final    NO GROWTH 5 DAYS Performed at [facility], [place] [address], [place], [place] [address]    Report Status 10/02/2018 FINAL  Final  Blood Culture (routine x 2)     Status: None   Collection Time: 09/27/18  7:26 AM  Result Value Ref Range Status   Specimen Description BLOOD LEFT HAND  Final   Special Requests   Final    BOTTLES DRAWN AEROBIC AND ANAEROBIC Blood Culture adequate volume   Culture   Final    NO GROWTH 5 DAYS Performed at [facility], [place] [address] [place], [place] [address]    Report Status 10/02/2018 FINAL  Final  Culture, respiratory (tracheal aspirate)     Status: None   Collection Time: 09/27/18 10:02 AM  Result Value Ref Range Status   Specimen Description TRACHEAL ASPIRATE  Final   Special Requests Normal  Final   Gram Stain   Final  FEW WBC PRESENT, PREDOMINANTLY PMN RARE GRAM POSITIVE COCCI IN PAIRS    Culture   Final    RARE Consistent with normal respiratory flora. Performed at [facility], [place] [address], [place], [place] [address]    Report Status 09/30/2018 FINAL  Final  MRSA PCR Screening     Status: None   Collection Time: 09/27/18 11:48 AM  Result Value Ref Range Status   MRSA by PCR NEGATIVE NEGATIVE Final    Comment:        The GeneXpert MRSA Assay (FDA approved for NASAL specimens only), is one component of a comprehensive MRSA colonization surveillance program. It is not intended to diagnose MRSA infection nor to guide or monitor treatment for MRSA infections. Performed at [facility], [place] [address] [place], [place] [address]   Culture, Urine     Status: None   Collection Time: 09/27/18  5:13 PM  Result Value Ref Range Status   Specimen Description URINE, CLEAN CATCH  Final   Special Requests NONE  Final   Culture   Final    NO GROWTH Performed at [facility], [place] [address], [place], [place] [address]    Report Status 09/28/2018 FINAL  Final     Labs: Basic Metabolic Panel: Recent Labs  Lab 09/30/18 0332 10/01/18 0556  10/02/18 0557 10/04/18 0507 10/05/18 0538 10/05/18 0957 10/05/18 1001 10/06/18 0506  NA 129* 132* 131* 136 133* 136 134* 135  K 4.1 4.0 4.2 3.8 3.7 3.4* 3.7 3.9  CL 86* 92* 92* 88* 89*  --   --  93*  CO2 34* 31 31 36* 37*  --   --  34*  GLUCOSE 107* 93 81 69* 70  --   --  68*  BUN 29* 24* 21 20 21   --   --  18  CREATININE 0.58 0.54 0.57 0.49 0.60  --   --  0.45  CALCIUM 8.5* 8.3* 8.3* 8.6* 8.4*  --   --  8.3*  MG 1.5* 2.1  --  2.0  --   --   --   --   PHOS 3.2 2.7  --  2.1*  --   --   --   --    Liver Function Tests: Recent Labs  Lab 09/30/18 0332  ALBUMIN 2.5*   No results for input(s): LIPASE, AMYLASE in the last 168 hours. No results for input(s): AMMONIA in the last 168 hours. CBC: Recent Labs  Lab 09/30/18 0332 10/01/18 0556 10/02/18 0557 10/05/18 0538 10/05/18 0957 10/05/18 1001 10/06/18 0506  WBC 25.0* 17.0* 17.9* 20.0*  --   --  16.7*  NEUTROABS  --   --   --  15.0*  --   --  11.6*  HGB 12.6 12.3 12.7 13.1 13.6 14.3 12.3  HCT 38.5 39.0 40.6 42.7 40.0 42.0 40.2  MCV 84.2 85.0 85.5 87.3  --   --  87.8  PLT 249 254 291 506*  --   --  538*   Cardiac Enzymes: No results for input(s): CKTOTAL, CKMB, CKMBINDEX, TROPONINI in the last 168 hours. BNP: BNP (last 3 results) Recent Labs    09/23/18 1433 09/27/18 0718 09/28/18 0412  BNP 339.1* 1,852.5* 230.6*    ProBNP (last 3 results) No results for input(s): PROBNP in the last 8760 hours.  CBG: Recent Labs  Lab 09/29/18 1159 09/29/18 1554  GLUCAP 108* 122*       Signed:  Kayleen Memos, MD  [facility] 10/06/2018, 9:35 AM

## 2018-10-06 NOTE — Progress Notes (Signed)
Discharge instructions (including medications) discussed with and copy provided to patient/caregiver 

## 2018-10-06 NOTE — Progress Notes (Signed)
Occupational Therapy Treatment and Discharge Patient Details Name: Evelyn Hughes MRN: [id number] DOB: [birth year] Today's Date: 10/06/2018    History of present illness Pt is 63 yo female, recently discharged for CHF, COPD with h/o tobacco abuse who returns 5/4 due to declining respiratory status with new confusion. Required reintubation in ED. Extubated 09/29/18.     OT comments  This 63 yo female admitted with above presents to acute OT with making progress with mobility and ADLs and educated today on energy conservation and managing her own O2 tubing. Pt is to D/C today so will D/C from acute OT with HHOT being recommended.  Follow Up Recommendations  Home health OT;Supervision/Assistance - 24 hour    Equipment Recommendations  3 in 1 bedside commode       Precautions / Restrictions Precautions Precautions: Fall Restrictions Weight Bearing Restrictions: No       Mobility Bed Mobility               General bed mobility comments: Pt sitting on EOB upon arrival  Transfers Overall transfer level: Needs assistance Equipment used: None Transfers: Sit to/from Stand Sit to Stand: Min guard              Balance Overall balance assessment: Needs assistance Sitting-balance support: Feet supported Sitting balance-Leahy Scale: Good     Standing balance support: No upper extremity supported Standing balance-Leahy Scale: Fair                             ADL either performed or assessed with clinical judgement   ADL Overall ADL's : Needs assistance/impaired     Grooming: Min guard;Standing               Lower Body Dressing: Min guard;Sit to/from stand   Toilet Transfer: Min guard;Ambulation;Regular Toilet;Grab bars Toilet Transfer Details (indicate cue type and reason): No AD and not furniture walking           General ADL Comments: Went over energy conservation handout with her and gave her a copy. Had pt work on managing her own O2 tubing  as she went around her room and into bathroom     Vision Baseline Vision/History: Wears glasses Wears Glasses: Reading only Patient Visual Report: No change from baseline            Cognition Arousal/Alertness: Awake/alert Behavior During Therapy: WFL for tasks assessed/performed Overall Cognitive Status: Impaired/Different from baseline                                 General Comments: Some word finding issues, she could sometimes describe what she was trying to say but could not always say the exact word.                   Pertinent Vitals/ Pain       Pain Assessment: No/denies pain         Frequency  Min 3X/week        Progress Toward Goals  OT Goals(current goals can now be found in the care plan section)  Progress towards OT goals: Progressing toward goals     Plan Discharge plan remains appropriate;Frequency needs to be updated       AM-PAC OT "6 Clicks" Daily Activity     Outcome Measure   Help from another person eating meals?: None Help from another person  taking care of personal grooming?: A Little Help from another person toileting, which includes using toliet, bedpan, or urinal?: A Little Help from another person bathing (including washing, rinsing, drying)?: A Little Help from another person to put on and taking off regular upper body clothing?: A Little Help from another person to put on and taking off regular lower body clothing?: A Little 6 Click Score: 19    End of Session Equipment Utilized During Treatment: Oxygen(3 liters)  OT Visit Diagnosis: Unsteadiness on feet (R26.81);Other abnormalities of gait and mobility (R26.89);Muscle weakness (generalized) (M62.81);Other symptoms and signs involving cognitive function   Activity Tolerance Patient tolerated treatment well   Patient Left in bed;with call bell/phone within reach;with bed alarm set   Nurse Communication          Time: 0321-2248 OT Time Calculation  (min): 41 min  Charges: OT General Charges $OT Visit: 1 Visit OT Treatments $Self Care/Home Management : 38-52 mins  [address], OTR/L Acute [employer] Pager [phone] Office [phone]      Almon Register 10/06/2018, 4:54 PM

## 2018-10-06 NOTE — Discharge Instructions (Signed)
Acute Respiratory Failure, Adult ° °Acute respiratory failure occurs when there is not enough oxygen passing from your lungs to your body. When this happens, your lungs have trouble removing carbon dioxide from the blood. This causes your blood oxygen level to drop too low as carbon dioxide builds up. °Acute respiratory failure is a medical emergency. It can develop quickly, but it is temporary if treated promptly. Your lung capacity, or how much air your lungs can hold, may improve with time, exercise, and treatment. °What are the causes? °There are many possible causes of acute respiratory failure, including: °· Lung injury. °· Chest injury or damage to the ribs or tissues near the lungs. °· Lung conditions that affect the flow of air and blood into and out of the lungs, such as pneumonia, acute respiratory distress syndrome, and cystic fibrosis. °· Medical conditions, such as strokes or spinal cord injuries, that affect the muscles and nerves that control breathing. °· Blood infection (sepsis). °· Inflammation of the pancreas (pancreatitis). °· A blood clot in the lungs (pulmonary embolism). °· A large-volume blood transfusion. °· Burns. °· Near-drowning. °· Seizure. °· Smoke inhalation. °· Reaction to medicines. °· Alcohol or drug overdose. °What increases the risk? °This condition is more likely to develop in people who have: °· A blocked airway. °· Asthma. °· A condition or disease that damages or weakens the muscles, nerves, bones, or tissues that are involved in breathing. °· A serious infection. °· A health problem that blocks the unconscious reflex that is involved in breathing, such as hypothyroidism or sleep apnea. °· A lung injury or trauma. °What are the signs or symptoms? °Trouble breathing is the main symptom of acute respiratory failure. Symptoms may also include: °· Rapid breathing. °· Restlessness or anxiety. °· Skin, lips, or fingernails that appear blue (cyanosis). °· Rapid heart  rate. °· Abnormal heart rhythms (arrhythmias). °· Confusion or changes in behavior. °· Tiredness or loss of energy. °· Feeling sleepy or having a loss of consciousness. °How is this diagnosed? °Your health care provider can diagnose acute respiratory failure with a medical history and physical exam. During the exam, your health care provider will listen to your heart and check for crackling or wheezing sounds in your lungs. Your may also have tests to confirm the diagnosis and determine what is causing respiratory failure. These tests may include: °· Measuring the amount of oxygen in your blood (pulse oximetry). The measurement comes from a small device that is placed on your finger, earlobe, or toe. °· Other blood tests to measure blood gases and to look for signs of infection. °· Sampling your cerebral spinal fluid or tracheal fluid to check for infections. °· Chest X-[place] to look for fluid in spaces that should be filled with air. °· Electrocardiogram (ECG) to look at the heart's electrical activity. °How is this treated? °Treatment for this condition usually takes places in a hospital intensive care unit (ICU). Treatment depends on what is causing the condition. It may include one or more treatments until your symptoms improve. Treatment may include: °· Supplemental oxygen. Extra oxygen is given through a tube in the nose, a face mask, or a hood. °· A device such as a continuous positive airway pressure (CPAP) or bi-level positive airway pressure (BiPAP or BPAP) machine. This treatment uses mild air pressure to keep the airways open. A mask or other device will be placed over your nose or mouth. A tube that is connected to a motor will deliver oxygen through the   mask.  Ventilator. This treatment helps move air into and out of the lungs. This may be done with a bag and mask or a machine. For this treatment, a tube is placed in your windpipe (trachea) so air and oxygen can flow to the lungs.  Extracorporeal  membrane oxygenation (ECMO). This treatment temporarily takes over the function of the heart and lungs, supplying oxygen and removing carbon dioxide. ECMO gives the lungs a chance to recover. It may be used if a ventilator is not effective.  Tracheostomy. This is a procedure that creates a hole in the neck to insert a breathing tube.  Receiving fluids and medicines.  Rocking the bed to help breathing. Follow these instructions at home:  Take over-the-counter and prescription medicines only as told by your health care provider.  Return to normal activities as told by your health care provider. Ask your health care provider what activities are safe for you.  Keep all follow-up visits as told by your health care provider. This is important. How is this prevented? Treating infections and medical conditions that may lead to acute respiratory failure can help prevent the condition from developing. Contact a health care provider if:  You have a fever.  Your symptoms do not improve or they get worse. Get help right away if:  You are having trouble breathing.  You lose consciousness.  Your have cyanosis or turn blue.  You develop a rapid heart rate.  You are confused. These symptoms may represent a serious problem that is an emergency. Do not wait to see if the symptoms will go away. Get medical help right away. Call your local emergency services (911 in the [place] Do not drive yourself to the hospital. This information is not intended to replace advice given to you by your health care provider. Make sure you discuss any questions you have with your health care provider. Document Released: 05/11/2013 Document Revised: 12/02/2015 Document Reviewed: 11/22/2015 Elsevier Interactive Patient Education  2019 [place].   Heart Failure  Heart failure means your heart has trouble pumping blood. This makes it hard for your body to work well. Heart failure is usually a long-term (chronic)  condition. You must take good care of yourself and follow your treatment plan from your doctor. Follow these instructions at home: Medicines  Take over-the-counter and prescription medicines only as told by your doctor. ? Do not stop taking your medicine unless your doctor told you to do that. ? Do not skip any doses. ? Refill your prescriptions before you run out of medicine. You need your medicines every day. Eating and drinking   Eat heart-healthy foods. Talk with a diet and nutrition specialist (dietitian) to make an eating plan.  Choose foods that: ? Have no trans fat. ? Are low in saturated fat and cholesterol.  Choose healthy foods, like: ? Fresh or frozen fruits and vegetables. ? Fish. ? Low-fat (lean) meats. ? Legumes (like beans, peas, and lentils). ? Fat-free or low-fat dairy products. ? Whole-grain foods. ? High-fiber foods.  Limit salt (sodium) if told by your doctor. Ask your nutrition specialist to recommend heart-healthy seasonings.  Cook in healthy ways instead of frying. Healthy ways of cooking include: ? Roasting. ? Grilling. ? Broiling. ? Baking. ? Poaching. ? Steaming. ? Stir-frying.  Limit how much fluid you drink, if told by your doctor. Lifestyle  Do not smoke or use chewing tobacco. Do not use nicotine gum or patches before talking to your doctor.  Limit alcohol intake  to no more than 1 drink a day for non-pregnant women and 2 drinks a day for men. One drink equals 12 oz of beer, 5 oz of wine, or 1 oz of hard liquor. ? Tell your doctor if you drink alcohol many times a week. ? Talk with your doctor about whether any alcohol is safe for you. ? You should stop drinking alcohol: ? If your heart has been damaged by alcohol. ? You have very bad heart failure.  Do not use illegal drugs.  Lose weight if told by your doctor.  Do moderate physical activity if told by your doctor. Ask your doctor what activities are safe for you if: ? You are of  older age (elderly). ? You have very bad heart failure. Keep track of important information  Weigh yourself every day. ? Weigh yourself every morning after you pee (urinate) and before breakfast. ? Wear the same amount of clothing each time. ? Write down your daily weight. Give your record to your doctor.  Check and write down your blood pressure as told by your doctor.  Check your pulse as told by your doctor. Dealing with heat and cold  If the weather is very hot: ? Avoid activity that takes a lot of energy. ? Use air conditioning or fans, or find a cooler place. ? Avoid caffeine. ? Avoid alcohol. ? Wear clothing that is loose-fitting, lightweight, and light-colored.  If the weather is very cold: ? Avoid activity that takes a lot of energy. ? Layer your clothes. ? Wear mittens or gloves, a hat, and a scarf when you go outside. ? Avoid alcohol. General instructions  Manage other conditions that you have as told by your doctor.  Learn to manage stress. If you need help, ask your doctor.  Plan rest periods for when you get tired.  Get education and support as needed.  Get rehab (rehabilitation) to help you stay independent and to help with everyday tasks.  Stay up to date with shots (immunizations), especially pneumococcal and flu (influenza) shots.  Keep all follow-up visits as told by your doctor. This is important. Contact a doctor if:  You gain weight quickly.  You are more short of breath than normal.  You cannot do your normal activities.  You tire easily.  You cough more than normal, especially with activity.  You have any or more puffiness (swelling) in areas such as your hands, feet, ankles, or belly (abdomen).  You cannot sleep because it is hard to breathe.  You feel like your heart is beating fast (palpitations).  You get dizzy or light-headed when you stand up. Get help right away if:  You have trouble breathing.  You or someone else notices  a change in your awareness. This could be trouble staying awake or trouble concentrating.  You have chest pain or discomfort.  You pass out (faint). Summary  Heart failure means your heart has trouble pumping blood.  Make sure you refill your prescriptions before you run out of medicine. You need your medicines every day.  Keep records of your weight and blood pressure to give to your doctor.  Contact a doctor if you gain weight quickly. This information is not intended to replace advice given to you by your health care provider. Make sure you discuss any questions you have with your health care provider. Document Released: 02/13/2008 Document Revised: 01/28/2018 Document Reviewed: 05/28/2016 Elsevier Interactive Patient Education  2019 [place] Heart  failure, also called congestive heart failure, occurs when your heart does not pump blood well enough to meet your body's needs for oxygen-rich blood. Heart failure is a long-term (chronic) condition. Living with heart failure can be challenging. However, following your health care provider's instructions about a healthy lifestyle and working with a diet and nutrition specialist (dietitian) to choose the right foods may help to improve your symptoms. What are tips for following this plan? General guidelines  Do not eat more than 2,300 mg of salt (sodium) a day. The amount of sodium that is recommended for you may be lower, depending on your condition.  Maintain a healthy body weight as directed. Ask your health care provider what a healthy weight is for you. ? Check your weight every day. ? Work with your health care provider and dietitian to make a plan that is right for you to lose weight or maintain your current weight.  Limit how much fluid you drink. Ask your health care provider or dietitian how much fluid you can have each day.  Limit or avoid alcohol as told by your health care provider or  dietitian. Reading food labels  Check food labels for the amount of sodium per serving. Choose foods that have less than 140 mg (milligrams) of sodium in each serving.  Check food labels for the number of calories per serving. This is important if you need to limit your daily calorie intake to lose weight.  Check food labels for the serving size. If you eat more than one serving, you will be eating more sodium and calories than what is listed on the label.  Look for foods that are labeled as "sodium-free," "very low sodium," or "low sodium." ? Foods labeled as "reduced sodium" or "lightly salted" may still have more sodium than what is recommended for you. Cooking  Avoid adding salt when cooking. Ask your health care provider or dietitian before using salt substitutes.  Season food with salt-free seasonings, spices, or herbs. Check the label of seasoning mixes to make sure they do not contain salt.  Cook with heart-healthy oils, such as olive, canola, soybean, or sunflower oil.  Do not fry foods. Cook foods using low-fat methods, such as baking, boiling, grilling, and broiling.  Limit unhealthy fats when cooking by: ? Removing the skin from poultry, such as chicken. ? Removing all visible fats from meats. ? Skimming the fat off from stews, soups, and gravies before serving them. Meal planning   Limit your intake of: ? Processed, canned, or pre-packaged foods. ? Foods that are high in trans fat, such as fried foods. ? Sweets, desserts, sugary drinks, and other foods with added sugar. ? Full-fat dairy products, such as whole milk.  Eat a balanced diet that includes: ? 4-5 servings of fruit each day and 4-5 servings of vegetables each day. At each meal, try to fill half of your plate with fruits and vegetables. ? Up to 6-8 servings of whole grains each day. ? Up to 2 servings of lean meat, poultry, or fish each day. One serving of meat is equal to 3 oz. This is about the same size  as a deck of cards. ? 2 servings of low-fat dairy each day. ? Heart-healthy fats. Healthy fats called omega-3 fatty acids are found in foods such as flaxseed and cold-water fish like sardines, salmon, and mackerel.  Aim to eat 25-35 g (grams) of fiber a day. Foods that are high in fiber include apples, broccoli, carrots, beans,  peas, and whole grains.  Do not add salt or condiments that contain salt (such as soy sauce) to foods before eating.  When eating at a restaurant, ask that your food be prepared with less salt or no salt, if possible.  Try to eat 2 or more vegetarian meals each week.  Eat more home-cooked food and eat less restaurant, buffet, and fast food. Recommended foods The items listed may not be a complete list. Talk with your dietitian about what dietary choices are best for you. Grains Bread with less than 80 mg of sodium per slice. Whole-wheat pasta, quinoa, and brown rice. Oats and oatmeal. Barley. [place]. Grits and cream of wheat. Whole-grain and whole-wheat cold cereal. Vegetables All fresh vegetables. Vegetables that are frozen without sauce or added salt. Low-sodium or sodium-free canned vegetables. Fruits All fresh, frozen, and canned fruits. Dried fruits, such as raisins, prunes, and cranberries. Meats and other protein foods Lean cuts of meat. Skinless chicken and [place]. Fish with high omega-3 fatty acids, such as salmon, sardines, and other cold-water fishes. Eggs. Dried beans, peas, and edamame. Unsalted nuts and nut butters. Dairy Low-fat or nonfat (skim) milk and dried milk. Rice milk, soy milk, and almond milk. Low-fat or nonfat yogurt. Small amounts of reduced-sodium block cheese. Low-sodium cottage cheese. Fats and oils Olive, canola, soybean, flaxseed, or sunflower oil. Avocado. Sweets and desserts Apple sauce. Granola bars. Sugar-free pudding and gelatin. Frozen fruit bars. Seasoning and other foods Fresh and dried herbs. Lemon or lime juice. Vinegar.  Low-sodium ketchup. Salt-free marinades, salad dressings, sauces, and seasonings. Foods to avoid The items listed may not be a complete list. Talk with your dietitian about what dietary choices are best for you. Grains Bread with more than 80 mg of sodium per slice. Hot or cold cereal with more than 140 mg sodium per serving. Salted pretzels and crackers. Pre-packaged breadcrumbs. Bagels, croissants, and biscuits. Vegetables Canned vegetables. Frozen vegetables with sauce or seasonings. Creamed vegetables. [place] fries. Onion rings. Pickled vegetables and sauerkraut. Fruits Fruits that are dried with sodium-containing preservatives. Meats and other protein foods Ribs and chicken wings. Bacon, ham, pepperoni, bologna, salami, and packaged luncheon meats. Hot dogs, bratwurst, and sausage. Canned meat. Smoked meat and fish. Salted nuts and seeds. Dairy Whole milk, half-and-half, and cream. Buttermilk. Processed cheese, cheese spreads, and cheese curds. Regular cottage cheese. Feta cheese. Shredded cheese. String cheese. Fats and oils Butter, lard, shortening, ghee, and bacon fat. Canned and packaged gravies. Seasoning and other foods Onion salt, garlic salt, table salt, and sea salt. Marinades. Regular salad dressings. Relishes, pickles, and olives. Meat flavorings and tenderizers, and bouillon cubes. Horseradish, ketchup, and mustard. Worcestershire sauce. Teriyaki sauce, soy sauce (including reduced sodium). Hot sauce and Tabasco sauce. Steak sauce, fish sauce, oyster sauce, and cocktail sauce. Taco seasonings. Barbecue sauce. [place]ar sauce. Summary  A heart failure eating plan includes changes that limit your intake of sodium and unhealthy fat, and it may help you lose weight or maintain a healthy weight. Your health care provider may also recommend limiting how much fluid you drink.  Most people with heart failure should eat no more than 2,300 mg of salt (sodium) a day. The amount of sodium that  is recommended for you may be lower, depending on your condition.  Contact your health care provider or dietitian before making any major changes to your diet. This information is not intended to replace advice given to you by your health care provider. Make sure you discuss any questions  you have with your health care provider. Document Released: 09/20/2016 Document Revised: 09/20/2016 Document Reviewed: 09/20/2016 Elsevier Interactive Patient Education  2019 [place].   Heart Failure Action Plan A heart failure action plan helps you understand what to do when you have symptoms of heart failure. Follow the plan that was created by you and your health care provider. Review your plan each time you visit your health care provider. Red zone These signs and symptoms mean you should get medical help right away:  You have trouble breathing when resting.  You have a dry cough that is getting worse.  You have swelling or pain in your legs or abdomen that is getting worse.  You suddenly gain more than 2-3 lb (0.9-1.4 kg) in a day, or more than 5 lb (2.3 kg) in one week. This amount may be more or less depending on your condition.  You have trouble staying awake or you feel confused.  You have chest pain.  You do not have an appetite.  You pass out. If you experience any of these symptoms:  Call your local emergency services (911 in the [place] right away or seek help at the emergency department of the nearest hospital. Yellow zone These signs and symptoms mean your condition may be getting worse and you should make some changes:  You have trouble breathing when you are active or you need to sleep with extra pillows.  You have swelling in your legs or abdomen.  You gain 2-3 lb (0.9-1.4 kg) in one day, or 5 lb (2.3 kg) in one week. This amount may be more or less depending on your condition.  You get tired easily.  You have trouble sleeping.  You have a dry cough. If you  experience any of these symptoms:  Contact your health care provider within the next day.  Your health care provider may adjust your medicines. Green zone These signs mean you are doing well and can continue what you are doing:  You do not have shortness of breath.  You have very little swelling or no new swelling.  Your weight is stable (no gain or loss).  You have a normal activity level.  You do not have chest pain or any other new symptoms. Follow these instructions at home:  Take over-the-counter and prescription medicines only as told by your health care provider.  Weigh yourself daily. Your target weight is __________ lb (__________ kg). ? Call your health care provider if you gain more than __________ lb (__________ kg) in a day, or more than __________ lb (__________ kg) in one week.  Eat a heart-healthy diet. Work with a diet and nutrition specialist (dietitian) to create an eating plan that is best for you.  Keep all follow-up visits as told by your health care provider. This is important. Where to find more information  American Heart Association: www.heart.org Summary  Follow the action plan that was created by you and your health care provider.  Get help right away if you have any symptoms in the Red zone. This information is not intended to replace advice given to you by your health care provider. Make sure you discuss any questions you have with your health care provider. Document Released: 06/15/2016 Document Revised: 01/08/2017 Document Reviewed: 06/15/2016 Elsevier Interactive Patient Education  2019 [employer] Inc[employer]   Heart Failure Exacerbation  Heart failure is a condition in which the heart does not fill up with enough blood, and therefore does not pump enough blood and  oxygen to the body. When this happens, parts of the body do not get the blood and oxygen they need to function properly. This can cause symptoms such as breathing problems, fatigue,  swelling, and confusion. Heart failure exacerbation refers to heart failure symptoms that get worse. The symptoms may get worse suddenly or develop slowly over time. Heart failure exacerbation is a serious medical problem that should be treated right away. What are the causes? A heart failure exacerbation can be triggered by:  Not taking your heart failure medicines correctly.  Infections.  Eating an unhealthy diet or a diet that is high in salt (sodium).  Drinking too much fluid.  Drinking alcohol.  Taking illegal drugs, such as cocaine or methamphetamine.  Not exercising. Other causes include:  Other heart conditions such as an irregular heartbeat (arrhythmia).  Anemia.  Other medical problems, such as kidney failure. Sometimes the cause of the exacerbation is not known. What are the signs or symptoms? When heart failure symptoms suddenly or slowly get worse, this may be a sign of heart failure exacerbation. Symptoms of heart failure include:  Breathing problems or shortness of breath.  Chronic coughing or wheezing.  Fatigue.  Nausea or lack of appetite.  Feeling light-headed.  Confusion or memory loss.  Increased heart rate or irregular heartbeat.  Buildup of fluid in the legs, ankles, feet, or abdomen.  Difficulty breathing when lying down. How is this diagnosed? This condition is diagnosed based on:  Your symptoms and medical history.  A physical exam. You may also have tests, including:  Electrocardiogram (ECG). This test measures the electrical activity of your heart.  Echocardiogram. This test uses sound waves to take a picture of your heart to see how well it works.  Blood tests.  Imaging tests, such as: ? Chest X-ray. ? MRI. ? Ultrasound.  Stress test. This test examines how well your heart functions when you exercise. Your heart is monitored while you exercise on a treadmill or exercise bike. If you cannot exercise, medicines may be used to  increase your heartbeat in place of exercise.  Cardiac catheterization. During this test, a thin, flexible tube (catheter) is inserted into a blood vessel and threaded up to your heart. This test allows your health care provider to check the arteries that lead to your heart (coronary arteries).  Right heart catheterization. During this test, the pressure in your heart is measured. How is this treated? This condition may be treated by:  Adjusting your heart medicines.  Maintaining a healthy lifestyle. This includes: ? Eating a heart-healthy diet that is low in sodium. ? Not using any products that contain nicotine or tobacco, such as cigarettes and e-cigarettes. ? Regular exercise. ? Monitoring your fluid intake. ? Monitoring your weight and reporting changes to your health care provider.  Treating sleep apnea, if you have this condition.  Surgery. This may include: ? Implanting a device that helps both sides of your heart contract at the same time (cardiac resynchronization therapy device). This can help with heart function and relieve heart failure symptoms. ? Implanting a device that can correct heart rhythm problems (implantable cardioverter defibrillator). ? Connecting a device to your heart to help it pump blood (ventricular assist device). ? Heart transplant. Follow these instructions at home: Medicines  Take over-the-counter and prescription medicines only as told by your health care provider.  Do not stop taking your medicines or change the amount you take. If you are having problems or side effects from your medicines,  talk to your health care provider.  If you are having difficulty paying for your medicines, contact a social worker or your clinic. There are many programs to assist with medicine costs.  Talk to your health care provider before starting any new medicines or supplements.  Make sure your health care provider and pharmacist have a list of all the medicines you  are taking. Eating and drinking   Avoid drinking alcohol.  Eat a heart-healthy diet as told by your health care provider. This includes: ? Plenty of fruits and vegetables. ? Lean proteins. ? Low-fat dairy. ? Whole grains. ? Foods that are low in sodium. Activity   Exercise regularly as told by your health care provider. Balance exercise with rest.  Ask your health care provider what activities are safe for you. This includes sexual activity, exercise, and daily tasks at home or work. Lifestyle  Do not use any products that contain nicotine or tobacco, such as cigarettes and e-cigarettes. If you need help quitting, ask your health care provider.  Maintain a healthy weight. Ask your health care provider what weight is healthy for you.  Consider joining a patient support group. This can help with emotional problems you may have, such as stress and anxiety. General instructions  Talk to your health care provider about flu and pneumonia vaccines.  Keep a list of medicines that you are taking. This may help in emergency situations.  Keep all follow-up visits as told by your health care provider. This is important. Contact a health care provider if:  You have questions about your medicines or you miss a dose.  You feel anxious, depressed, or stressed.  You have swelling in your feet, ankles, legs, or abdomen.  You have shortness of breath during activity or exercise.  You have a cough.  You have a fever.  You have trouble sleeping.  You gain 2-3 lb (1-1.4 kg) in 24 hours or 5 lb (2.3 kg) in a week. Get help right away if:  You have chest pain.  You have shortness of breath while resting.  You have severe fatigue.  You are confused.  You have severe dizziness.  You have a rapid or irregular heartbeat.  You have nausea or you vomit.  You have a cough that is worse at night or you cannot lie flat.  You have a cough that will not go away.  You have severe  depression or sadness. Summary  When heart failure symptoms get worse, it is called heart failure exacerbation.  Common causes of this condition include taking medicines incorrectly, infections, and drinking alcohol.  This condition may be treated by adjusting medicines, maintaining a healthy lifestyle, or surgery.  Do not stop taking your medicines or change the amount you take. If you are having problems or side effects from your medicines, talk to your health care provider. This information is not intended to replace advice given to you by your health care provider. Make sure you discuss any questions you have with your health care provider. Document Released: 09/17/2016 Document Revised: 09/17/2016 Document Reviewed: 09/17/2016 Elsevier Interactive Patient Education  2019 [place] With Heart Failure  Heart failure is a long-term (chronic) condition in which the heart cannot pump enough blood through the body. When this happens, parts of the body do not get the blood and oxygen they need. There is no cure for heart failure at this time, so it is important for you to take good care of yourself  and follow the treatment plan set by your health care provider. If you are living with heart failure, there are ways to help you manage the disease. Follow these instructions at home: Living with heart failure requires you to make changes in your life. Your health care team will teach you about the changes you need to make in order to relieve your symptoms and lower your risk of going to the hospital. Follow the treatment plan as set by your health care provider. Medicines Medicines are important in reducing your heart's workload, slowing the progression of heart failure, and improving your symptoms.  Take over-the-counter and prescription medicines only as told by your health care provider.  Do not stop taking your medicine unless your health care provider tells you to do that.  Do  not skip any dose of your medicine.  Refill prescriptions before you run out of medicine. You need your medicines every day. Eating and drinking   Eat heart-healthy foods. Talk with a dietitian to make an eating plan that is right for you. ? If directed by your health care provider: ? Limit salt (sodium). Lowering your sodium intake may reduce symptoms of heart failure. Ask a dietitian to recommend heart-healthy seasonings. ? Limit your fluid intake. Fluid restriction may reduce symptoms of heart failure. ? Use low-fat cooking methods instead of frying. Low-fat methods include roasting, grilling, broiling, baking, poaching, steaming, and stir-frying. ? Choose foods that contain no trans fat and are low in saturated fat and cholesterol. Healthy choices include fresh or frozen fruits and vegetables, fish, lean meats, legumes, fat-free or low-fat dairy products, and whole-grain or high-fiber foods.  Limit alcohol intake to no more than 1 drink a day for nonpregnant women and 2 drinks a day for men. One drink equals 12 oz of beer, 5 oz of wine, or 1 oz of hard liquor. ? Drinking more than that is harmful to your heart. Tell your health care provider if you drink alcohol several times a week. ? Talk with your health care provider about whether any level of alcohol use is safe for you. Activity   Ask your health care provider about attending cardiac rehabilitation. These programs include aerobic physical activity, which provides many benefits for your heart.  If no cardiac rehabilitation program is available, ask your health care provider what aerobic exercises are safe for you to do. Lifestyle Make the lifestyle changes recommended by your health care provider. In general:  Lose weight if your health care provider tells you to do that. Weight loss may reduce symptoms of heart failure.  Do not use any products that contain nicotine or tobacco, such as cigarettes or e-cigarettes. If you need  help quitting, ask your health care provider.  Do not use street (illegal) drugs.  Return to your normal activities as told by your health care provider. Ask your health care provider what activities are safe for you. General instructions   Make sure you weigh yourself every day to track your weight. Rapid weight gain may indicate an increase in fluid in your body and may increase the workload of your heart. ? Weigh yourself every morning. Do this after you urinate but before you eat breakfast. ? Wear the same type of clothing, without shoes, each time you weigh yourself. ? Weigh yourself on the same scale and in the same spot each time.  Living with chronic heart failure often leads to emotions such as fear, stress, anxiety, and depression. If you feel any of  these emotions and need help coping, contact your health care provider. Other ways to get help include: ? Talking to friends and family members about your condition. They can give you support and guidance. Explain your symptoms to them and, if comfortable, invite them to attend appointments or rehabilitation with you. ? Joining a support group for people with chronic heart failure. Talking with other people who have the same symptoms may give you new ways of coping with your disease and your emotions.  Stay up to date with your shots (vaccines). Staying current on pneumococcal and influenza vaccines is especially important in preventing germs from attacking your airways (respiratory infections).  Keep all follow-up visits as told by your health care provider. This is important. How to recognize changes in your condition You and your family members need to know what changes to watch for in your condition. Watch for the following changes and report them to your health care provider:  Sudden weight gain. Ask your health care provider what amount of weight gain to report.  Shortness of breath: ? Feeling short of breath while at rest,  with no exercise or activity that required great effort. ? Feeling breathless with activity.  Swelling of your lower legs or ankles.  Difficulty sleeping: ? You wake up feeling short of breath. ? You have to use more pillows to raise your head in order to sleep.  Frequent, dry, hacking cough.  Loss of appetite.  Feeling more tired all the time.  Depression or feelings of sadness or hopelessness.  Bloating in the stomach. Where to find more information  Local support groups. Ask your health care provider about groups near you.  The American Heart Association: www.heart.org Contact a health care provider if:  You have a rapid weight gain.  You have increasing shortness of breath that is unusual for you.  You are unable to participate in your usual physical activities.  You tire easily.  You cough more than normal, especially with physical activity.  You have any swelling or more swelling in areas such as your hands, feet, ankles, or abdomen.  You feel like your heart is beating quickly (palpitations).  You become dizzy or light-headed when you stand up. Get help right away if:  You have difficulty breathing.  You notice or your family notices a change in your awareness, such as having trouble staying awake or having difficulty with concentration.  You have pain or discomfort in your chest.  You have an episode of fainting (syncope). Summary  There is no cure for heart failure, so it is important for you to take good care of yourself and follow the treatment plan set by your health care provider.  Medicines are important in reducing your heart's workload, slowing the progression of heart failure, and improving your symptoms.  Living with chronic heart failure often leads to emotions such as fear, stress, anxiety, and depression. If you are feeling any of these emotions and need help coping, contact your health care provider. This information is not intended to  replace advice given to you by your health care provider. Make sure you discuss any questions you have with your health care provider. Document Released: 09/18/2016 Document Revised: 09/18/2016 Document Reviewed: 09/18/2016 Elsevier Interactive Patient Education  2019 [place] With Heart Failure Caregivers provide physical and emotional support to friends or family members with heart failure. If you are supporting someone who has heart failure, you play an important role in making  sure that the person follows schedules, takes medicines, and follows the overall treatment and rehabilitation plan. What do I need to know about heart failure? Heart failure is a serious condition. It means that the heart cannot fill or pump enough blood and oxygen to support the body and its functions. This can cause damage to many parts of the body, including important organs.  The person with heart failure may:  Need to take medicines on a regular basis.  Need to have some procedures or surgery.  Require rehabilitation treatment.  Need to make changes in: ? Lifestyle. ? Diet. ? Physical activity and exercise. Planning for discharge from the hospital Privacy laws require that your friend or family member give you permission to hear private health information before this information will be shared with you. Talk with the health care provider about these rules. Before your friend or family member leaves the hospital after treatment, make sure you understand:  The recovery process after heart failure.  Physical, emotional, behavior, and other changes that occur in the person with heart failure.  The medicines that the person with heart failure has to take and when to take them.  Whether you will need extra help at home.  The treatments for heart failure.  Changes you have to make at home to make the person safe.  How to lower the person's risk of having other heart  problems.  Diet and exercise changes for the person with heart failure.  Whether the person with heart failure will need help using the bathroom, bathing, eating, or doing other activities.  Whether you need to get certain devices or equipment for the person with heart failure.  That heart failure requires emergency medical care if symptoms appear. Caring for yourself It is normal to have many different emotions while caring for someone with heart failure. The lifestyle changes that come with this diagnosis can be stressful and difficult. As a caregiver, you need to:  Monitor yourself for signs of emotional, physical, and mental exhaustion (caregiver burnout). This can happen if you feel overwhelmed.  Take care of yourself so that you can better meet the needs of your loved one and also meet your own needs. Signs of caregiver burnout  Sleep problems.  Difficulty concentrating.  Changes in appetite.  Depressed mood and mood swings.  Feeling emotionally out of control.  Feeling unmotivated to do anything.  Neglecting your loved one, including forgetting to give medicines or keep appointments. Ways to care for yourself   Find a local support group.  Ask friends and family for help.  Get counseling or therapy.  Ask a health care provider for help.  Take time for yourself.  Relax, exercise, and eat healthy. What support is available?  Ask your health care provider for help.  Think about taking classes to learn more about supporting someone with heart failure.  Take a class to become certified in CPR.  Think about joining a support group for people who are caring for someone with heart failure. It is possible to find such a group in your local community.  Search for information online from trusted sources such as the American Heart Association: www.heart.org Summary  Before you leave the hospital, make sure you understand how to care for someone with heart  failure.  It is normal to have many different emotions while caring for someone with heart failure.  Take care of yourself by asking for help and taking time to relax.  Monitor yourself for  caregiver burnout and know when to ask for help. This information is not intended to replace advice given to you by your health care provider. Make sure you discuss any questions you have with your health care provider. Document Released: 09/17/2016 Document Revised: 09/17/2016 Document Reviewed: 09/17/2016 Elsevier Interactive Patient Education  2019 [employer].

## 2018-10-08 ENCOUNTER — Other Ambulatory Visit: Payer: Self-pay

## 2018-10-08 ENCOUNTER — Ambulatory Visit: Payer: BC Managed Care – PPO | Attending: Family Medicine | Admitting: Family Medicine

## 2018-10-08 ENCOUNTER — Encounter: Payer: Self-pay | Admitting: Family Medicine

## 2018-10-08 VITALS — BP 116/73 | HR 91 | Temp 98.6°F | Ht 65.0 in | Wt 109.8 lb

## 2018-10-08 DIAGNOSIS — R636 Underweight: Secondary | ICD-10-CM

## 2018-10-08 DIAGNOSIS — J9611 Chronic respiratory failure with hypoxia: Secondary | ICD-10-CM | POA: Diagnosis not present

## 2018-10-08 DIAGNOSIS — I509 Heart failure, unspecified: Secondary | ICD-10-CM | POA: Diagnosis not present

## 2018-10-08 DIAGNOSIS — D72829 Elevated white blood cell count, unspecified: Secondary | ICD-10-CM

## 2018-10-08 DIAGNOSIS — Z9981 Dependence on supplemental oxygen: Secondary | ICD-10-CM

## 2018-10-08 DIAGNOSIS — B370 Candidal stomatitis: Secondary | ICD-10-CM | POA: Diagnosis not present

## 2018-10-08 DIAGNOSIS — Z09 Encounter for follow-up examination after completed treatment for conditions other than malignant neoplasm: Secondary | ICD-10-CM

## 2018-10-08 MED ORDER — FLUTICASONE-SALMETEROL 100-50 MCG/DOSE IN AEPB: 1.0000 | INHALATION_SPRAY | Freq: Two times a day (BID) | RESPIRATORY_TRACT | 5 refills | Status: DC

## 2018-10-08 MED ORDER — ALBUTEROL SULFATE HFA 108 (90 BASE) MCG/ACT IN AERS: 2.0000 | INHALATION_SPRAY | Freq: Four times a day (QID) | RESPIRATORY_TRACT | 2 refills | Status: AC | PRN

## 2018-10-08 MED ORDER — FUROSEMIDE 20 MG PO TABS: 20.0000 mg | ORAL_TABLET | Freq: Every day | ORAL | 5 refills | Status: DC

## 2018-10-08 MED ORDER — SPIRONOLACTONE 25 MG PO TABS: 25.0000 mg | ORAL_TABLET | Freq: Every day | ORAL | 5 refills | Status: DC

## 2018-10-08 MED ORDER — TIOTROPIUM BROMIDE MONOHYDRATE 18 MCG IN CAPS: 18.0000 ug | ORAL_CAPSULE | Freq: Every day | RESPIRATORY_TRACT | 5 refills | Status: DC

## 2018-10-08 MED ORDER — NYSTATIN 100000 UNIT/ML MT SUSP: 5.0000 mL | Freq: Four times a day (QID) | OROMUCOSAL | 6 refills | Status: DC

## 2018-10-08 NOTE — Progress Notes (Signed)
Per pt she was in the hospital for CHF. Per pt she is feeling better.

## 2018-10-08 NOTE — Patient Instructions (Signed)

## 2018-10-08 NOTE — Progress Notes (Signed)
New Patient Office Visit  Subjective:  Patient ID: Evelyn Hughes, female    DOB: 16-Nov-1955  Age: 63 y.o. MRN: [id number]  CC: Establish care after recent hospitalization/hospital discharge follow-up  HPI [name] presents to establish care and for follow-up status post recent hospitalization from 09/27/2018 through 10/06/2018 at [facility] with discharge diagnosis of respiratory failure with hypoxia and hypercapnia.  Per hospital discharge records, patient was thought to have likely had a combination of acute hypoxic respiratory distress/failure as well as cor pulmonale resulting in the need for intubation.  Patient had previously been hospitalized from 09/21/2018 through 5/82024 acute respiratory failure, right pleural effusion, acute renal failure, pulmonary hypertension and a stage I pressure ulcer of the left buttock.  On her initial admission, patient presented with acute renal failure and decompensated CHF with volume overload and chest x-ray showed profound emphysematous changes with hyperinflation of the lungs and infiltrates with a large right pleural effusion and BNP at that time was 2000.  She was discharged home on oxygen by nasal cannula at 2 L continuously and advised to follow-up with her primary care provider however patient states that she is generally avoided doctors and likely had not seen a doctor in about 20 years prior to her hospitalization at the beginning of May.  Per her most recent discharge summary, patient had onset of confusion and decreased appetite for about 24 hours and became lethargic which prompted her husband to call 911 and at the emergency department she was found to be hypoxic and required intubation.      Patient reports that she does feel better today than she did prior to her hospital admission however she reports that she is very fatigued due to getting little rest during her hospitalization.  Patient is status post heart cath yesterday.  She  reports that she is compliant with her medications.  She is accompanied by her husband who states that he has been administering patient's medications.  Husband and wife both had several questions regarding patient's condition as well as her medications.  She reports that she was tested for COVID-19 during her hospitalization.  She denies any current issues with fever or chills.  She does continue to have some mild chest congestion.  Mild cough which is no longer productive.  Patient is wearing her support hose due to her bilateral lower extremity edema.  She reports that yesterday she was so tired after getting home from the hospital that she did not use her support hose but did stay mostly in her recliner with her feet elevated.  She reports that she is stop smoking cigarettes completely and had been decreasing her use of cigarettes over the past 1 to 2 years but admits that she has smoked as much as 2 to 3 packs/day of cigarettes for at least a 20-year.      Patient and husband report that they really do not understand much about congestive heart failure.  A home health nurse from [facility] did come to the home yesterday to help with instruction regarding CHF.  Patient states that she does have a home scale with which to weigh herself but patient states that she is very confused and concerned regarding fluctuation in her weight.  She is also taking nutritional supplements recommended during hospitalization and at discharge secondary to her poor nutritional status and being underweight.  Patient did have a pressure ulcer which is resolving and being followed by home health.  Patient and husband  have multiple questions regarding CHF and sodium content/allowed sodium intake.  She denies any chest pain or chest pressure.  No leg pain but legs feel uncomfortable and somewhat heavy secondary to swelling.  Swelling has improved since initial hospitalization.  She denies any abdominal pain-no nausea/vomiting/diarrhea or  constipation.  She has been using her inhaled medications-on review of medicines with husband he had only been giving Advair type medication once daily instead of twice per day but was able to recognize per hospital discharge medication list that the medication should be twice daily.  As patient has only been home from the hospital since yesterday, she is technically only missed 1 dose.  Patient has been rinsing her mouth out after use of her respiratory medications due to concern of developing thrush.  She denies any current mouth soreness or irritation but states that her mouth is slightly sticky.  She denies any sore throat or difficulty swallowing.  She has had urinary frequency but this is associated with her use of fluid medication, she denies any dysuria.  No increased muscle or joint pain.  Patient feels as if she has had a decrease/decline in memory/cognition following recent hospitalizations.  Patient states that her brain feels foggy at times.  Past Medical History:  Diagnosis Date  . Allergy     Past Surgical History:  Procedure Laterality Date  . RIGHT HEART CATH N/A 10/05/2018   Procedure: RIGHT HEART CATH;  Surgeon: Jolaine Artist, MD;  Location: [place] [facility];  Service: Cardiovascular;  Laterality: N/A;    Family History  Problem Relation Age of Onset  . Cancer Mother     Social History   Socioeconomic History  . Marital status: Married    Spouse name: Not on file  . Number of children: Not on file  . Years of education: Not on file  . Highest education level: Not on file  Occupational History  . Not on file  Social Needs  . Financial resource strain: Not on file  . Food insecurity:    Worry: Not on file    Inability: Not on file  . Transportation needs:    Medical: Not on file    Non-medical: Not on file  Tobacco Use  . Smoking status: Former Smoker    Types: Cigarettes  . Smokeless tobacco: Never Used  Substance and Sexual Activity  . Alcohol use:  No    Alcohol/week: 0.0 standard drinks  . Drug use: No  . Sexual activity: Not on file  Lifestyle  . Physical activity:    Days per week: Not on file    Minutes per session: Not on file  . Stress: Not on file  Relationships  . Social connections:    Talks on phone: Not on file    Gets together: Not on file    Attends religious service: Not on file    Active member of club or organization: Not on file    Attends meetings of clubs or organizations: Not on file    Relationship status: Not on file  . Intimate partner violence:    Fear of current or ex partner: Not on file    Emotionally abused: Not on file    Physically abused: Not on file    Forced sexual activity: Not on file  Other Topics Concern  . Not on file  Social History Narrative  . Not on file    ROS Review of Systems  Constitutional: Positive for fatigue. Negative for chills  and fever.  HENT: Positive for congestion. Negative for mouth sores, nosebleeds, postnasal drip, rhinorrhea, sore throat and trouble swallowing.   Eyes: Negative for photophobia and visual disturbance.  Respiratory: Positive for cough (Mild, nonproductive) and shortness of breath. Negative for wheezing.   Cardiovascular: Positive for leg swelling. Negative for chest pain and palpitations.  Gastrointestinal: Negative for abdominal pain, constipation, diarrhea and nausea.  Endocrine: Positive for polyuria. Negative for polydipsia and polyphagia.  Genitourinary: Positive for frequency. Negative for dysuria.  Musculoskeletal: Negative for arthralgias and back pain.  Neurological: Negative for dizziness and headaches.  Hematological: Negative for adenopathy. Does not bruise/bleed easily.    Objective:   Today's Vitals: BP 116/73 (BP Location: Right Arm, Patient Position: Sitting, Cuff Size: Small)   Pulse 91   Temp 98.6 F (37 C)   Ht 5\' 5"  (1.651 m)   Wt 109 lb 12.8 oz (49.8 kg)   SpO2 97%   BMI 18.27 kg/m   Physical Exam Vitals signs  and nursing note reviewed.  Constitutional:      General: She is not in acute distress.    Appearance: Normal appearance. She is ill-appearing.     Comments: Thin, older female in no acute distress, patient is wearing oxygen by nasal cannula.  Patient is accompanied by her husband at patient request because her oxygen equipment was too heavy.  She does appear to be slightly fatigued.  HENT:     Right Ear: Tympanic membrane, ear canal and external ear normal.     Left Ear: Tympanic membrane, ear canal and external ear normal.     Nose: Congestion and rhinorrhea (Mild, clear) present.     Mouth/Throat:     Mouth: Mucous membranes are moist.     Pharynx: Posterior oropharyngeal erythema present.     Comments: Mild posterior pharynx erythema.  Patient also has some adherent, white patches on the oral mucosa to the right of the uvula Eyes:     Conjunctiva/sclera: Conjunctivae normal.  Neck:     Musculoskeletal: No muscular tenderness.     Vascular: No carotid bruit.     Comments: No JVD Cardiovascular:     Rate and Rhythm: Normal rate and regular rhythm.  Pulmonary:     Effort: Pulmonary effort is normal.     Breath sounds: Normal breath sounds. No wheezing, rhonchi or rales.  Abdominal:     Palpations: Abdomen is soft.     Tenderness: There is no abdominal tenderness. There is no right CVA tenderness, left CVA tenderness, guarding or rebound.  Musculoskeletal: Normal range of motion.        General: No tenderness or deformity.     Right lower leg: Edema present.     Left lower leg: Edema present.     Comments: Patient is wearing bilateral compressive hose but still has significant edema which is approximately 1+ pitting to below the knee  Lymphadenopathy:     Cervical: No cervical adenopathy.  Skin:    General: Skin is warm and dry.     Comments: Patient has open toed compression stockings and has slight bluish tint to the dorsum of the toes/foot.  Patient with normal capillary refill  and peripheral pulses are palpable but reduced  Neurological:     General: No focal deficit present.     Mental Status: She is alert and oriented to person, place, and time.  Psychiatric:        Mood and Affect: Mood normal.  Behavior: Behavior normal.     Assessment & Plan:  1. Acute congestive heart failure, unspecified heart failure type [facility]) Patient is status post recent hospitalization x2 in May and patient's hospital records reviewed.  Patient with recent diagnosis of congestive heart failure/cor pulmonale related to chronic tobacco use/pulmonary hypertension and chronic lung disease/emphysema.  Patient is currently on Lasix 20 mg daily which she is to continue and patient will have BMP in follow-up of electrolytes and renal function.  Patient is also on spironolactone and refills were provided of medications.  Patient is to continue follow-up with cardiology.  More than 25 minutes were spent discussing heart failure, emphysema/COPD, sodium restrictions/high sodium foods, the need for checking weight daily and wearing the same clothes when checking the weight and the need to report fluctuations in weight to myself or the cardiologist.  Patient also has home health follow-up for further teaching. - Basic Metabolic Panel - furosemide (LASIX) 20 MG tablet; Take 1 tablet (20 mg total) by mouth daily.  Dispense: 30 tablet; Refill: 5 - spironolactone (ALDACTONE) 25 MG tablet; Take 1 tablet (25 mg total) by mouth daily.  Dispense: 30 tablet; Refill: 5  2. Chronic respiratory failure with hypoxia, on home oxygen therapy [facility]) Patient reports that she is now completely stop smoking.  Medications refilled as per hospital discharge and medications and dosing reviewed with patient and husband which did lead to the discovery that patient's husband had misread directions on Advair but will now give medication twice daily.  Patient has home health oxygen supplier and patient's husband is encouraged  to contact their electrical service provider so that they are aware that patient will need to be on high priority list to restore power in the case of power failure due to her need for continuous oxygen therapy.  Keep follow-up with pulmonology and make sure that needed immunizations are up-to-date including yearly influenza immunization. - albuterol (VENTOLIN HFA) 108 (90 Base) MCG/ACT inhaler; Inhale 2 puffs into the lungs every 6 (six) hours as needed for wheezing or shortness of breath.  Dispense: 1 Inhaler; Refill: 2 - Fluticasone-Salmeterol (ADVAIR DISKUS) 100-50 MCG/DOSE AEPB; Inhale 1 puff into the lungs 2 (two) times daily.  Dispense: 60 each; Refill: 5 - tiotropium (SPIRIVA HANDIHALER) 18 MCG inhalation capsule; Place 1 capsule (18 mcg total) into inhaler and inhale daily for 30 days.  Dispense: 30 capsule; Refill: 5  3. Thrush, oral Patient with evidence of thrush on examination and prescription provided for nystatin oral solution for treatment.  Patient is aware of the need to make sure that she rinses her mouth out/practices good oral hygiene due to increased risk of thrush with use of inhaled steroid medication - nystatin (MYCOSTATIN) 100000 UNIT/ML suspension; Take 5 mLs (500,000 Units total) by mouth 4 (four) times daily. X 10 days then as needed for mouth irritation/thrush  Dispense: 60 mL; Refill: 6  4. Leukocytosis, unspecified type White blood cell count remained elevated at 16.7 on 10/06/2018 and is currently on antibiotic therapy with azithromycin but has only 1 day of antibiotic therapy left.  CBC will be rechecked to make sure that the level is decreasing.  She was also tested for coronavirus during hospitalization but it appears that this result is still pending. - CBC with Differential  5. Hospital discharge follow-up Information from patient's hospitalization in early May as well as most recent hospitalization reviewed.  Pertinent labs, imaging and medications reviewed with  the patient and her husband as well as explanation of patient's  diagnoses.  CBC and BMP will be done as per hospital discharge recommendations and patient is to follow-up with cardiology and pulmonology as outpatient  6. Underweight due to inadequate caloric intake Patient is encouraged to continue nutritional supplementation as per hospital discharge as patient with BMI of 18 which may actually be slightly lower as some of patient's current weight is due to fluid retention/peripheral edema   Outpatient Encounter Medications as of 10/08/2018  Medication Sig  . albuterol (VENTOLIN HFA) 108 (90 Base) MCG/ACT inhaler Inhale 2 puffs into the lungs every 6 (six) hours as needed for wheezing or shortness of breath.  Marland Kitchen azithromycin (ZITHROMAX) 250 MG tablet Take 1 tablet 250 mg daily x5 days  . feeding supplement, ENSURE ENLIVE, (ENSURE ENLIVE) LIQD Take 237 mLs by mouth 2 (two) times daily between meals.  . Fluticasone-Salmeterol (ADVAIR DISKUS) 100-50 MCG/DOSE AEPB Inhale 1 puff into the lungs 2 (two) times daily.  . furosemide (LASIX) 20 MG tablet Take 1 tablet (20 mg total) by mouth daily.  . magnesium oxide (MAG-OX) 400 (241.3 Mg) MG tablet Take 1 tablet (400 mg total) by mouth 2 (two) times daily for 30 days.  . potassium chloride SA (K-DUR) 20 MEQ tablet Take 20 mEq by mouth daily.  Marland Kitchen spironolactone (ALDACTONE) 25 MG tablet Take 1 tablet (25 mg total) by mouth daily.  Marland Kitchen tiotropium (SPIRIVA HANDIHALER) 18 MCG inhalation capsule Place 1 capsule (18 mcg total) into inhaler and inhale daily for 30 days.  . Multiple Vitamin (MULTIVITAMIN WITH MINERALS) TABS tablet Take 1 tablet by mouth daily. (Patient not taking: Reported on 10/08/2018)  . nicotine (NICODERM CQ - DOSED IN MG/24 HOURS) 14 mg/24hr patch Place 1 patch (14 mg total) onto the skin daily. (Patient not taking: Reported on 10/08/2018)   No facility-administered encounter medications on file as of 10/08/2018.    An After Visit Summary was  printed and given to the patient.  Follow-up: Return in about 3 weeks (around 10/29/2018) for CHF- will do labs today and in2-3 weeks.  Antony Blackbird, MD

## 2018-10-09 ENCOUNTER — Encounter: Payer: Self-pay | Admitting: Family Medicine

## 2018-10-09 ENCOUNTER — Other Ambulatory Visit: Payer: Self-pay | Admitting: Family Medicine

## 2018-10-09 DIAGNOSIS — D473 Essential (hemorrhagic) thrombocythemia: Secondary | ICD-10-CM

## 2018-10-09 DIAGNOSIS — D75839 Thrombocytosis, unspecified: Secondary | ICD-10-CM

## 2018-10-09 LAB — BASIC METABOLIC PANEL WITH GFR
BUN/Creatinine Ratio: 40 — ABNORMAL HIGH (ref 12–28)
BUN: 17 mg/dL (ref 8–27)
CO2: 31 mmol/L — ABNORMAL HIGH (ref 20–29)
Calcium: 9.3 mg/dL (ref 8.7–10.3)
Chloride: 92 mmol/L — ABNORMAL LOW (ref 96–106)
Creatinine, Ser: 0.42 mg/dL — ABNORMAL LOW (ref 0.57–1.00)
GFR calc Af Amer: 127 mL/min/1.73
GFR calc non Af Amer: 110 mL/min/1.73
Glucose: 84 mg/dL (ref 65–99)
Potassium: 5 mmol/L (ref 3.5–5.2)
Sodium: 136 mmol/L (ref 134–144)

## 2018-10-09 LAB — CBC WITH DIFFERENTIAL/PLATELET
Basophils Absolute: 0 x10E3/uL (ref 0.0–0.2)
Basos: 0 %
EOS (ABSOLUTE): 0.3 x10E3/uL (ref 0.0–0.4)
Eos: 2 %
Hematocrit: 41.6 % (ref 34.0–46.6)
Hemoglobin: 13.6 g/dL (ref 11.1–15.9)
Immature Grans (Abs): 0.2 x10E3/uL — ABNORMAL HIGH (ref 0.0–0.1)
Immature Granulocytes: 1 %
Lymphocytes Absolute: 2 x10E3/uL (ref 0.7–3.1)
Lymphs: 12 %
MCH: 27.2 pg (ref 26.6–33.0)
MCHC: 32.7 g/dL (ref 31.5–35.7)
MCV: 83 fL (ref 79–97)
Monocytes Absolute: 1.6 x10E3/uL — ABNORMAL HIGH (ref 0.1–0.9)
Monocytes: 10 %
Neutrophils Absolute: 12.6 x10E3/uL — ABNORMAL HIGH (ref 1.4–7.0)
Neutrophils: 75 %
Platelets: 886 x10E3/uL (ref 150–450)
RBC: 5 x10E6/uL (ref 3.77–5.28)
RDW: 13.8 % (ref 11.7–15.4)
WBC: 16.6 x10E3/uL — ABNORMAL HIGH (ref 3.4–10.8)

## 2018-10-09 NOTE — Progress Notes (Signed)
Patient ID: Evelyn Hughes, female   DOB: 07/15/1955, 63 y.o.   MRN: [id number]   Patient with abnormal platelet count of 886 on CBC done status post hospital discharge.  Patient will be notified to start daily 81 mg baby aspirin, return to clinic on Tuesday for recheck of CBC and urgent referral placed for hematology.  Go to ED this holiday weekend if any problems or concerns

## 2018-10-14 ENCOUNTER — Other Ambulatory Visit: Payer: Self-pay

## 2018-10-14 ENCOUNTER — Telehealth: Payer: Self-pay | Admitting: Family Medicine

## 2018-10-14 ENCOUNTER — Ambulatory Visit: Payer: BC Managed Care – PPO | Attending: Family Medicine

## 2018-10-14 DIAGNOSIS — D75839 Thrombocytosis, unspecified: Secondary | ICD-10-CM

## 2018-10-14 DIAGNOSIS — D473 Essential (hemorrhagic) thrombocythemia: Secondary | ICD-10-CM

## 2018-10-14 LAB — CBC WITH DIFFERENTIAL/PLATELET
Basophils Absolute: 0.1 x10E3/uL (ref 0.0–0.2)
Basos: 1 %
EOS (ABSOLUTE): 0.2 x10E3/uL (ref 0.0–0.4)
Eos: 1 %
Hematocrit: 40.4 % (ref 34.0–46.6)
Hemoglobin: 12.8 g/dL (ref 11.1–15.9)
Lymphocytes Absolute: 1.8 x10E3/uL (ref 0.7–3.1)
Lymphs: 10 %
MCH: 26.9 pg (ref 26.6–33.0)
MCHC: 31.7 g/dL (ref 31.5–35.7)
MCV: 85 fL (ref 79–97)
Monocytes Absolute: 1.9 x10E3/uL — ABNORMAL HIGH (ref 0.1–0.9)
Monocytes: 11 %
Neutrophils Absolute: 14.2 x10E3/uL — ABNORMAL HIGH (ref 1.4–7.0)
Neutrophils: 77 %
Platelets: 751 x10E3/uL — ABNORMAL HIGH (ref 150–450)
RBC: 4.75 x10E6/uL (ref 3.77–5.28)
RDW: 17.9 % — ABNORMAL HIGH (ref 11.7–15.4)
WBC: 18.2 x10E3/uL — ABNORMAL HIGH (ref 3.4–10.8)

## 2018-10-14 NOTE — Telephone Encounter (Signed)
Called and spoke with patient and her husband and informed them that per the previous message, provider did say to take the baby ASA daily. Per pt husband, they want to see if the the ASA is working before they schedule the appt with the blood specialist. Per pt husband they will also like to know the results for patient blood work they did yesterday 10/13/18. Informed husband that provider is out of the office and once she returns then staff will call them back with results.

## 2018-10-14 NOTE — Telephone Encounter (Signed)
Pt called wanting to know her lab results, would like to know if she can take Asprin please follow up

## 2018-10-16 NOTE — Telephone Encounter (Signed)
Informed patient and her husband with results and informed them with what provider stated. Per pt husband, due to patient platelets number looking like it's decreasing, he would like to have patient take the ASA 81mg  for the next 3 wks until patient come back in office to recheck her CBC. Per patient husband if the ASA is working then they don't see any need of going to hematology office. Per pt husband, when that next lab result comes back then they'll make the decision to go to the hematologist or not. Appt for labs was made for 3 wks from now due to patient husband wanting patient to have had the ASA 81mg  for a little while.

## 2018-10-16 NOTE — Telephone Encounter (Signed)
Her platelet count did decrease but still elevated. If she does not want to go to hematology yet, please see if she can return for lab visit and recheck her platelet count in about 2-3 weeks

## 2018-10-17 ENCOUNTER — Other Ambulatory Visit: Payer: Self-pay | Admitting: Family Medicine

## 2018-10-17 DIAGNOSIS — D473 Essential (hemorrhagic) thrombocythemia: Secondary | ICD-10-CM

## 2018-10-17 DIAGNOSIS — D75839 Thrombocytosis, unspecified: Secondary | ICD-10-CM

## 2018-10-17 NOTE — Progress Notes (Signed)
Patient ID: Evelyn Hughes, female   DOB: 13-Feb-1956, 63 y.o.   MRN: [id number]   Order placed for CBC as patient and husband wish to wait for another repeat of CBC prior to attending a hematology appointment.

## 2018-10-19 ENCOUNTER — Other Ambulatory Visit: Payer: Self-pay

## 2018-10-19 ENCOUNTER — Ambulatory Visit (HOSPITAL_COMMUNITY)
Admit: 2018-10-19 | Discharge: 2018-10-19 | Disposition: A | Discharge status: Home / Self Care | Payer: BC Managed Care – PPO | Attending: Internal Medicine | Admitting: Internal Medicine

## 2018-10-19 ENCOUNTER — Telehealth (HOSPITAL_COMMUNITY): Payer: Self-pay

## 2018-10-19 VITALS — BP 122/66 | Wt 96.0 lb

## 2018-10-19 DIAGNOSIS — I2721 Secondary pulmonary arterial hypertension: Secondary | ICD-10-CM

## 2018-10-19 DIAGNOSIS — I5032 Chronic diastolic (congestive) heart failure: Secondary | ICD-10-CM | POA: Diagnosis not present

## 2018-10-19 DIAGNOSIS — J9611 Chronic respiratory failure with hypoxia: Secondary | ICD-10-CM

## 2018-10-19 DIAGNOSIS — I509 Heart failure, unspecified: Secondary | ICD-10-CM

## 2018-10-19 MED ORDER — FUROSEMIDE 20 MG PO TABS: 20.0000 mg | ORAL_TABLET | ORAL | 5 refills | Status: DC

## 2018-10-19 NOTE — Progress Notes (Signed)
Heart Failure TeleHealth Note  Due to national recommendations of social distancing due to [place] 19, Audio/video telehealth visit is felt to be most appropriate for this patient at this time.  See MyChart message from today for patient consent regarding telehealth for [facility].  Date:  10/19/2018   ID:  Evelyn Hughes, DOB 06/20/1955, MRN [id number]  Location: Home  Provider location: [place] [facility] Type of Visit: Established patient   PCP:  Antony Blackbird, MD  Cardiologist:  No primary care provider on file. Primary HF: Dr Haroldine Laws   Chief Complaint: Heart Failure   History of Present Illness: Evelyn Hughes is a 63 y.o. female with a history of tobacco abuse, COPD, pulmonary HTN, and chronic respiratory failure.   Admitted to [facility] 09/27/2018 with AMS found to be hypoxic on arrival requiring intubation. She was recently admitted from 09/21/2018-09/25/2018 for similar symptoms found to have a combination of what was likely cor pulmonale and acute hypoxic respiratory distress and failure requiring intubation. She self extubated and was ultimately placed on nasal cannulaanddischarged home with oxygen. Per chart review, the patients husband reported a gradual decline since hospital discharge.  Given this, her husband called EMS on 09/27/2018 for transport to the ED for further evaluation. On their arrival, she was found to be hypoxic and she was intubated withing 30 minutes of ED arrival. She was later extubated on 09/29/2018. Placed on IV lasix due to leg edema. Once diuresed, RHC was performed. RHC showed minimally elevated PA pressures and mildly elevated cardiac output. She was discharged on lasix 20 mg daily. Discharge weight was  111 pounds.   She  presents via Engineer, civil (consulting) for a telehealth visit today.   Overall feeling fine. Denies SOB/PND/Orthopnea. Appetite has improved. No fever or chills. Weight at home has been trending down from 105--->96  pounds. Denies dizziness. Wearing 2 liter oxygen. Taking all medications. She is not smoking. Followed by Alvis Lemmings HHPT and HHRN.     she denies symptoms worrisome for COVID 19.   RHC 10/05/18  RA = 1 ( v waves to 10 due to TR) RV = 32/4 PA = 35/11 (24) PCW = 10 Fick cardiac output/index = 6.42/4.15 PVR = 2.2 WU Ao sat = 99% PA sat =79%, 80% SVC = 79%  ECHO 09/22/2018 LVEF 55-60%, RV normal  Past Medical History:  Diagnosis Date  . Allergy    Past Surgical History:  Procedure Laterality Date  . RIGHT HEART CATH N/A 10/05/2018   Procedure: RIGHT HEART CATH;  Surgeon: Jolaine Artist, MD;  Location: [facility];  Service: Cardiovascular;  Laterality: N/A;     Current Outpatient Medications  Medication Sig Dispense Refill  . aspirin EC 81 MG tablet Take 81 mg by mouth daily.    . feeding supplement, ENSURE ENLIVE, (ENSURE ENLIVE) LIQD Take 237 mLs by mouth 2 (two) times daily between meals. 10 Bottle 0  . Fluticasone-Salmeterol (ADVAIR DISKUS) 100-50 MCG/DOSE AEPB Inhale 1 puff into the lungs 2 (two) times daily. 60 each 5  . furosemide (LASIX) 20 MG tablet Take 1 tablet (20 mg total) by mouth daily. 30 tablet 5  . magnesium oxide (MAG-OX) 400 (241.3 Mg) MG tablet Take 1 tablet (400 mg total) by mouth 2 (two) times daily for 30 days. 60 tablet 0  . nystatin (MYCOSTATIN) 100000 UNIT/ML suspension Take 5 mLs (500,000 Units total) by mouth 4 (four) times daily. X 10 days then as needed for mouth  irritation/thrush 60 mL 6  . spironolactone (ALDACTONE) 25 MG tablet Take 1 tablet (25 mg total) by mouth daily. 30 tablet 5  . tiotropium (SPIRIVA HANDIHALER) 18 MCG inhalation capsule Place 1 capsule (18 mcg total) into inhaler and inhale daily for 30 days. 30 capsule 5  . albuterol (VENTOLIN HFA) 108 (90 Base) MCG/ACT inhaler Inhale 2 puffs into the lungs every 6 (six) hours as needed for wheezing or shortness of breath. (Patient not taking: Reported on 10/19/2018) 1 Inhaler 2  .  nicotine (NICODERM CQ - DOSED IN MG/24 HOURS) 14 mg/24hr patch Place 1 patch (14 mg total) onto the skin daily. (Patient not taking: Reported on 10/08/2018) 30 patch 0   No current facility-administered medications for this encounter.     Allergies:   Sulfa antibiotics and Penicillins   Social History:  The patient  reports that she has quit smoking. Her smoking use included cigarettes. She has never used smokeless tobacco. She reports that she does not drink alcohol or use drugs.   Family History:  The patient's family history includes Cancer in her mother.   ROS:  Please see the history of present illness.   All other systems are personally reviewed and negative.   Exam:  Tele Health Call; Exam is subjective  General:  Speaks in full sentences. No resp difficulty. Lungs: Normal respiratory effort with conversation. On 2 liters oxygen.  Abdomen: Non-distended per patient report Extremities: Pt denies edema. Neuro: Alert & oriented x 3.   Recent Labs: 09/27/2018: ALT 234 09/28/2018: B Natriuretic Peptide 230.6 10/02/2018: TSH 4.123 10/04/2018: Magnesium 2.0 10/08/2018: BUN 17; Creatinine, Ser 0.42; Potassium 5.0; Sodium 136 10/14/2018: Hemoglobin 12.8; Platelets 751  Personally reviewed   Wt Readings from Last 3 Encounters:  10/19/18 43.5 kg  10/08/18 49.8 kg  10/06/18 50.5 kg      ASSESSMENT AND PLAN:  1. 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 LE u/s negative for DVT -Improved with recent hospitalization. - Improving with diuresis and O2.  - 10/03/2018 VQ negative.  - RHC  Minimally elevated PA pressures and PVR 2.2.  - Continue home oxygen 2 liters daily.    2. Chronic respiratory failure due to advanced COPD - O2 sats on 2 liters oxygen.   3.Chronic diastolic HF -NYHA II. Volume status sounds like its low. Weight has gone down 9 pounds.  Cut back lasix 20 mg every  other day. I explained I expect her weight to drift up a couple of pounds.  - Continue 25 mg spironolactone daily.  - [facility] to check BMET and fax results.   4. 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  Social distancing reinforced today.  Patient Risk: After full review of this patients clinical status, I feel that they are at moderate risk for cardiac decompensation at this time.  Relevant cardiac medications were reviewed at length with the patient today. The patient does not have concerns regarding their medications at this time.   The following changes were made today:  Cut lasix back to 20 mg every  Other day. We will ask Alvis Lemmings to check BMET and fax results.   Recommended follow-up: 4 weeks.   Today, I have spent 25 minutes with the patient with  telehealth technology discussing the above issues .    Jeanmarie Hubert, NP  10/19/2018 11:36 AM  [address] [place] [address] [phone] (office) [phone] (fax)

## 2018-10-19 NOTE — Telephone Encounter (Signed)
Reviewed AVS:  Cut lasix back to 20 mg every Other day./confirmed   We will ask Alvis Lemmings to check BMET tomorrow and fax results. . They want [facility] to call them with a time for the appointment. /VO placed over phone  Recommended follow-up: 4 weeks with amy /VV confirmed 1 July @930am 

## 2018-10-26 ENCOUNTER — Other Ambulatory Visit (HOSPITAL_COMMUNITY): Payer: Self-pay | Admitting: Internal Medicine

## 2018-10-29 ENCOUNTER — Telehealth (HOSPITAL_COMMUNITY): Payer: Self-pay | Admitting: *Deleted

## 2018-10-29 NOTE — Telephone Encounter (Signed)
[facility] orders signed and faxed.

## 2018-11-04 ENCOUNTER — Other Ambulatory Visit: Payer: Self-pay

## 2018-11-04 ENCOUNTER — Telehealth: Payer: Self-pay | Admitting: Family Medicine

## 2018-11-04 ENCOUNTER — Telehealth: Payer: Self-pay | Admitting: Internal Medicine

## 2018-11-04 ENCOUNTER — Ambulatory Visit: Payer: BC Managed Care – PPO | Attending: Family Medicine

## 2018-11-04 DIAGNOSIS — D75839 Thrombocytosis, unspecified: Secondary | ICD-10-CM

## 2018-11-04 DIAGNOSIS — D473 Essential (hemorrhagic) thrombocythemia: Secondary | ICD-10-CM

## 2018-11-04 NOTE — Telephone Encounter (Signed)
Returned call to patient. She says someone had called about my chart. Pt says she does not have a smart phone nor devicoe. Pt is scheduled for in office visit with MW 11/05/18. Nothing further needed.

## 2018-11-04 NOTE — Telephone Encounter (Signed)
Kumar from [facility] called requesting a prescription for a roller walker, please follow up.

## 2018-11-04 NOTE — Telephone Encounter (Signed)
Pt prescribed Mag-Ox while inpatient, RX written at discharge for her to continue taking this for 30 days. She is requesting a refill but it is unclear if it is necessary. Please refill if appropriate.

## 2018-11-04 NOTE — Telephone Encounter (Signed)
Can you follow-up and ask for what equipment is specifically needed

## 2018-11-04 NOTE — Telephone Encounter (Signed)
Her magnesium level was low in the hospital but back to a normal level at her time of discharge. If she would like I can schedule a lab only visit for her to have a magnesium level redrawn and let her know if she needs to continue the medication

## 2018-11-04 NOTE — Telephone Encounter (Signed)
1) Medication(s) Requested (by name): magnesium oxide (MAG-OX) 400 (241.3 Mg) MG tablet [[id number]]   2) Pharmacy of Choice: [facility] on [address]   3) Special Requests:   Approved medications will be sent to the pharmacy, we will reach out if there is an issue.  Requests made after 3pm may not be addressed until the following business day!  If a patient is unsure of the name of the medication(s) please note and ask patient to call back when they are able to provide all info, do not send to responsible party until all information is available!

## 2018-11-05 ENCOUNTER — Ambulatory Visit (INDEPENDENT_AMBULATORY_CARE_PROVIDER_SITE_OTHER): Payer: BC Managed Care – PPO | Admitting: Internal Medicine

## 2018-11-05 ENCOUNTER — Ambulatory Visit (INDEPENDENT_AMBULATORY_CARE_PROVIDER_SITE_OTHER): Payer: BC Managed Care – PPO

## 2018-11-05 ENCOUNTER — Encounter: Payer: Self-pay | Admitting: Internal Medicine

## 2018-11-05 ENCOUNTER — Other Ambulatory Visit: Payer: Self-pay

## 2018-11-05 ENCOUNTER — Other Ambulatory Visit: Payer: Self-pay | Admitting: Family Medicine

## 2018-11-05 ENCOUNTER — Telehealth: Payer: Self-pay | Admitting: Internal Medicine

## 2018-11-05 DIAGNOSIS — J9611 Chronic respiratory failure with hypoxia: Secondary | ICD-10-CM | POA: Diagnosis not present

## 2018-11-05 DIAGNOSIS — I2781 Cor pulmonale (chronic): Secondary | ICD-10-CM | POA: Diagnosis not present

## 2018-11-05 DIAGNOSIS — J449 Chronic obstructive pulmonary disease, unspecified: Secondary | ICD-10-CM

## 2018-11-05 DIAGNOSIS — J9612 Chronic respiratory failure with hypercapnia: Secondary | ICD-10-CM

## 2018-11-05 DIAGNOSIS — Z9981 Dependence on supplemental oxygen: Secondary | ICD-10-CM

## 2018-11-05 DIAGNOSIS — R2681 Unsteadiness on feet: Secondary | ICD-10-CM

## 2018-11-05 LAB — CBC WITH DIFFERENTIAL/PLATELET
Basophils Absolute: 0.1 x10E3/uL (ref 0.0–0.2)
Basos: 1 %
EOS (ABSOLUTE): 0.4 x10E3/uL (ref 0.0–0.4)
Eos: 4 %
Hematocrit: 42 % (ref 34.0–46.6)
Hemoglobin: 13.3 g/dL (ref 11.1–15.9)
Immature Grans (Abs): 0 x10E3/uL (ref 0.0–0.1)
Immature Granulocytes: 0 %
Lymphocytes Absolute: 1.9 x10E3/uL (ref 0.7–3.1)
Lymphs: 19 %
MCH: 27 pg (ref 26.6–33.0)
MCHC: 31.7 g/dL (ref 31.5–35.7)
MCV: 85 fL (ref 79–97)
Monocytes Absolute: 1.3 x10E3/uL — ABNORMAL HIGH (ref 0.1–0.9)
Monocytes: 13 %
Neutrophils Absolute: 6.3 x10E3/uL (ref 1.4–7.0)
Neutrophils: 63 %
Platelets: 433 x10E3/uL (ref 150–450)
RBC: 4.93 x10E6/uL (ref 3.77–5.28)
RDW: 15.8 % — ABNORMAL HIGH (ref 11.7–15.4)
WBC: 10.2 x10E3/uL (ref 3.4–10.8)

## 2018-11-05 MED ORDER — ROLLATOR ULTRA-LIGHT MISC: 0 refills | Status: DC

## 2018-11-05 NOTE — Telephone Encounter (Signed)
Left vm for patient w/ Dr. Siri Cole message.

## 2018-11-05 NOTE — Telephone Encounter (Signed)
Called and spoke with Patient.  CXR results given.  Understanding stated. Patient stated Magda Paganini was going to call her with PFT appointment time in 6 weeks.  Magda Paganini, do you have a date and time?

## 2018-11-05 NOTE — Telephone Encounter (Signed)
I never stated I would be giving her the date and time for PFT  I think that [facility] or Sharl Ma are the ones who are arranging that  Will forward to them to see if the pt's name is on the list to be scheduled, thanks

## 2018-11-05 NOTE — Assessment & Plan Note (Addendum)
RHC  10/05/2018  RA = 1 ( v waves to 10 due to TR) RV = 32/4 PA = 35/11 (24) PCW = 10 Fick cardiac output/index = 6.42/4.15 PVR = 2.2 WU Ao sat = 99% PA sat =79%, 80% SVC = 79% - v/q 10/03/2018 very low prob  With neg v/q for tepah most likely this is related to chronic hypoxemia from smoking/ copd and already improving with 02 and diuretics including aldactone > no changes needed

## 2018-11-05 NOTE — Assessment & Plan Note (Addendum)
1st placed on 02 09/2018 - [facility]03  10/21/2018   27   Suspect this was longstanding with the hypercarbic component now resolved   >>> rec 2lpm hs and keep sats > 90% daytime  due to cor pulmonale  Total time devoted to counseling  > 50 % of initial 60 min office visit:  review case with pt/extensive reviews of hosp records/See device teaching which extended face to face time for this visit discussion of options/alternatives/ personally creating written customized instructions  in presence of pt  then going over those specific  Instructions directly with the pt including how to use all of the meds but in particular covering each new medication in detail and the difference between the maintenance= "automatic" meds and the prns using an action plan format for the latter (If this problem/symptom => do that organization reading Left to right).  Please see AVS from this visit for a full list of these instructions which I personally wrote for this pt and  are unique to this visit.

## 2018-11-05 NOTE — Patient Instructions (Addendum)
02 2lpm at bedtime and adjust during the day to keep it over 90%  No change in your pulmonary medications    Please schedule a follow up office visit in 6 weeks, call sooner if needed with all medications /inhalers/ solutions in hand so we can verify exactly what you are taking. This includes all medications from all doctors and over the counters  - need pfts on return if possible

## 2018-11-05 NOTE — Progress Notes (Signed)
Patient ID: Evelyn Hughes, female   DOB: Oct 14, 1955, 63 y.o.   MRN: [id number]   Message received from home health physical therapist that patient needs Rollator.  Order will be printed.  Patient also with reduced need for oxygen and they would like to have order to decrease flow rate.

## 2018-11-05 NOTE — Progress Notes (Signed)
LMTCB

## 2018-11-05 NOTE — Progress Notes (Signed)
Evelyn Hughes, female    DOB: 05-01-1956    MRN: [id number]   Brief patient profile:  48 yowf quit smoking 09/2018 with freq cough dx as bronchitis growing up in house of smokers has used inhalers as needed short term only with good activity tolerance then new leg swelling April 2020 eventually admitted x 2    Admit date: 09/27/2018 Discharge date: 10/06/2018    Discharge Diagnoses:      Active Hospital Problems   Diagnosis Date Noted  . Pressure injury of skin 09/28/2018  . Respiratory failure with hypoxia and hypercapnia ([place]) 09/27/2018           Vitals:   10/06/18 0847 10/06/18 0848  BP:    Pulse:    Resp:    Temp:    SpO2: 98% 98%    History of present illness:  The patient is an ill-appearing 63 year old female, current every day smoker recently admitted to the hospital ( 5/4-5/8) and ultimately diagnosed with a combination of what was likely cor pulmonale and acute hypoxic respiratory distress and failure requiring intubation. She self extubated and was ultimately placed on nasal cannula and discharged home with oxygen. Per the husband's report and per EMS report the patient has had a gradual decline especially over the last 12 to 24 hours refusing to eat or drink with AMS. Husband called 72, Paramedics found the patient to be hypoxic, Increased her oxygen and transported her to the ED. She was intubated within 30 minutes of arrival. PCCM admitted to ICU and managed care.  TRH assumed care on 09/30/2018.  Admission 5/4-5/8 with intubation for respiratory failure Re-admission 5/10 for Acute on chronic Respiratory Failure Extubated on 09/29/18  5/4 CT abd/ pelvis >> 1. No acute intra-abdominal process. 2. Small ascites and mild anasarca.  5/4 CT chest >> 1. Moderate right and small left pleural effusions. No pneumonia. 2. Mild right heart enlargement with dilated main pulmonary artery, suggestive of pulmonary arterial hypertension. 3.  Emphysema  4. Aortic atherosclerosis  5/4 CTH >> Minimal frontal and parietal lobe atrophy. Otherwise negative exam.  09/22/2018 Echo The left ventricle has normal systolic function, with an ejection fraction of 55-60%. The cavity size was normal. Left ventricular diastolic function could not be evaluated. The right ventricle has normal systolc function. The cavity was mildly enlarged. Right ventricular systolic pressure is mildly elevated with an estimated pressure of 46.6 mmHg. Right atrial size was mildly dilated. The mitral valve is grossly normal. The tricuspid valve was grossly normal. The aortic valve is tricuspid No stenosis of the aortic valve. Limited study; normal LV function; mild RAE and RVE; moderate RV dysfunction; trace TR; mild pulmonary hypertension.  5/5 Doppler Studies Right: There is no evidence of deep vein thrombosis in the lower extremity. No cystic structure found in the popliteal fossa. Left: There is no evidence of deep vein thrombosis in the lower extremity. No cystic structure found in the popliteal fossa. Micro Data:  5/10 Blood Cultures x 2 5/10 Tracheal Aspirate- GPCs 5/10 Urine Legionella 5/10 Urine Strep 5/10 Covid Negative  Antimicrobials:  Completed Cefepime  Completed IV Vanc   Started on po Azithromycin x 5 days on 10/06/18 for her COPD  10/06/18: Patient seen and examined at her bedside.  No acute events overnight.  RHC was done yesterday.  Patient will follow-up with cardiology outpatient.  She has no new complaints.  She denies chest pain or dyspnea.  Admits to intermittent nonproductive cough with no subjective fevers or  chills.  States she feels well.  Bilateral lower extremity edema improving.  TED hose in place.  She wants to go home.  Mild leukocytosis which is improving.  Procalcitonin obtained and is negative.  Started on p.o. azithromycin x5 days for its anti-inflammatory properties.   On the day of discharge, the  patient was hemodynamically stable.  She will need to follow-up with her primary care provider, cardiology, and pulmonology posthospitalization.    Hospital Course:  Active Problems:   Respiratory failure with hypoxia and hypercapnia (HCC)   Pressure injury of skin  Resolving acute hypoxic hypercarbic respiratory failure, multifactorial secondary to acute volume overload, bilateral pleural effusion, COPD exacerbation Intubated on 09/27/2018.  Extubated on 09/29/2018 Currently on 2 L of oxygen by nasal cannula and saturating 97% Completed 5 days of IV cefepime Independent reviewed chest x-ray done on 10/03/2018 which showed small bilateral pleural effusions and right lower lobe atelectasis Patient encouraged to use incentive spirometer. 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 and right ventricular failure Heart failure team is following Continue diuresis as recommended by heart failure team Continue TED hose per heart failure team V/Qshowed very low probability PFTs with DLCOpending Pulmonary rehab on discharge Net I&O -2.5 L since admission Continue cardiac medications On Lasix 20 mg daily and spironolactone 25 mg daily as recommended by cardiology Follow-up with cardiology outpatient  Bilateral lower extremity 2+ pitting edema, improving with TED hose Improving with TED hose and diuretics  Bilateral Doppler ultrasound negative for DVT No obvious discoloration or hyperpigmentation  Emphysema COPD exacerbation Counseled on the importance of tobacco use cessation Maintain O2 saturation greater than 90% Continue inhalers Started Z-Pak, azithromycin 250 mg daily x5 days  Leukocytosis in the setting of recent CAP and steroid use Leukocytosis improving with  WBC 16 K from 20 K Procalcitonin is negative Afebrile Continue p.o. azithromycin to 50 mg daily x5 days  Resolved hypovolemic hyponatremia   Resolved hypomagnesemia post repletion  Tobacco use disorder Counseled on tobacco cessation at bedside Continue nicotine patch        History of Present Illness  11/05/2018  Pulmonary/ 1st office eval/Charliene Inoue  Chief Complaint  Patient presents with  . Pulmonary Consult    recent hospital admission for cor pulmonale and acute respiratory distress requiring intubation. She states that as far as she is aware she has never had any problems at all with her breathing and she denies any respiratory co's today.   Dyspnea:  Able to walk 13 min s 02 improving spiriva/wixella  Cough: none now Sleep: on 02 2lpm and able to sleep on side  Bed flat  SABA use: ventolin but not needing    No obvious day to day or daytime variability or assoc excess/ purulent sputum or mucus plugs or hemoptysis or cp or chest tightness, subjective wheeze or overt sinus or hb symptoms.   Sleeping  without nocturnal  or early am exacerbation  of respiratory  c/o's or need for noct saba. Also denies any obvious fluctuation of symptoms with weather or environmental changes or other aggravating or alleviating factors except as outlined above   No unusual exposure hx or h/o childhood pna/ asthma or knowledge of premature birth.  Current Allergies, Complete Past Medical History, Past Surgical History, Family History, and Social History were reviewed in [employer] record.  ROS  The following are not active complaints unless bolded Hoarseness, sore throat, dysphagia, dental problems, itching, sneezing,  nasal congestion or discharge of excess mucus or purulent secretions, ear ache,   fever, chills, sweats, unintended wt loss or wt gain, classically pleuritic or exertional cp,  orthopnea pnd or arm/hand swelling  or leg swelling, presyncope, palpitations,  abdominal pain, anorexia, nausea, vomiting, diarrhea  or change in bowel habits or change in bladder habits, change in stools or change in urine, dysuria, hematuria,  rash, arthralgias, visual complaints, headache, numbness, weakness or ataxia or problems with walking or coordination,  change in mood or  memory.           Past Medical History:  Diagnosis Date  . Allergy     Outpatient Medications Prior to Visit  Medication Sig Dispense Refill  . albuterol (VENTOLIN HFA) 108 (90 Base) MCG/ACT inhaler Inhale 2 puffs into the lungs every 6 (six) hours as needed for wheezing or shortness of breath. 1 Inhaler 2  . aspirin EC 81 MG tablet Take 81 mg by mouth daily.    . feeding supplement, ENSURE ENLIVE, (ENSURE ENLIVE) LIQD Take 237 mLs by mouth 2 (two) times daily between meals. 10 Bottle 0  . Fluticasone-Salmeterol (ADVAIR DISKUS) 100-50 MCG/DOSE AEPB Inhale 1 puff into the lungs 2 (two) times daily. 60 each 5  . furosemide (LASIX) 20 MG tablet Take 1 tablet (20 mg total) by mouth every other day. 30 tablet 5  . spironolactone (ALDACTONE) 25 MG tablet Take 1 tablet (25 mg total) by mouth daily. 30 tablet 5  . tiotropium (SPIRIVA HANDIHALER) 18 MCG inhalation capsule Place 1 capsule (18 mcg total) into inhaler and inhale daily for 30 days. 30 capsule 5  . nicotine (NICODERM CQ - DOSED IN MG/24 HOURS) 14 mg/24hr patch Place 1 patch (14 mg total) onto the skin daily. (Patient not taking: Reported on 10/08/2018) 30 patch 0  . nystatin (MYCOSTATIN) 100000 UNIT/ML suspension Take 5 mLs (500,000 Units total) by mouth 4 (four) times daily. X 10 days then as needed for mouth irritation/thrush 60 mL 6      Objective:     BP 112/70 (BP Location: Left Arm, Cuff Size: Normal)   Pulse 68   Temp 97.8 F (36.6 C) (Oral)   Ht 5\' 7"  (1.702 m)   Wt 102 lb (46.3 kg)   SpO2 98%   BMI 15.98 kg/m   SpO2: 98 % O2 Type: Continuous O2 O2 Flow Rate (L/min): 2 L/min   Amb wf nad   HEENT: nl dentition /  oropharynx. Nl external ear canals without cough reflex -  Mild bilateral non-specific turbinate edema     NECK :  without JVD/Nodes/TM/ nl carotid upstrokes bilaterally   LUNGS: no acc muscle use,  Mod barrel  contour chest wall with bilateral  Distant bs s audible wheeze and  without cough on insp or exp maneuver and mod  Hyperresonant  to  percussion bilaterally     CV:  RRR  no s3 or murmur or increase in P2, and trace pitting R > L with elastic hose    ABD:  soft and nontender with pos mid insp Hoover's  in the supine position. No bruits or organomegaly appreciated, bowel sounds nl  MS:   Nl gait/  ext warm without deformities, calf tenderness, cyanosis or clubbing No obvious joint restrictions   SKIN: warm and dry without lesions    NEURO:  alert, approp, nl sensorium with  no motor or cerebellar deficits apparent.           CXR PA and Lateral:   11/05/2018 :    I personally reviewed images and agree with radiology impression as follows:    COPD/emphysema.  No acute abnormalities.         Assessment    COPD mixed type ([place]) Quit smoking 09/2018 p presenting with acute hypercarbic/hypoxemic resp failure / acute cor pulmonale that improved  prior to initial pulmonary office visit 11/05/2018 as did hypercarbia  - The proper method of use, as well as anticipated side effects, of a metered-dose inhaler were discussed and demonstrated to the patient.   Group D in terms of symptom/risk and laba/lama/ICS  therefore appropriate rx at this point   >>>  Continue spiriva/wixella and f/u with pfts in 6 weeks  Critical of course she also stay off cigs    Cor pulmonale, chronic ([place]) RHC  10/05/2018  RA = 1 ( v waves to 10 due to TR) RV = 32/4 PA = 35/11 (24) PCW = 10 Fick cardiac output/index = 6.42/4.15 PVR = 2.2 WU Ao sat = 99% PA sat =79%, 80% SVC = 79% - v/q 10/03/2018 very low prob  With neg v/q for  [facility]  most likely this is related to chronic hypoxemia from  smoking/ copd and already improving with 02 and diuretics including aldactone > no changes needed      Respiratory failure with hypoxia and hypercapnia ([place]) 1st placed on 02 09/2018 - HC03  10/21/2018   27   Suspect this was longstanding with the hypercarbic component now resolved   >>> rec 2lpm hs and keep sats > 90% daytime  due to cor pulmonale     Total time devoted to counseling  > 50 % of initial 60 min office visit:  review case with pt/extensive reviews of hosp records/  device teaching which extended face to face time for this visit discussion of options/alternatives/ personally creating written customized instructions  in presence of pt  then going over those specific  Instructions directly with the pt including how to use all of the meds but in particular covering each new medication in detail and the difference between the maintenance= "automatic" meds and the prns using an action plan format for the latter (If this problem/symptom => do that organization reading Left to right).  Please see AVS from this visit for a full list of these instructions which I personally wrote for this pt and  are unique to this visit.          Christinia Gully, MD 11/05/2018

## 2018-11-05 NOTE — Telephone Encounter (Signed)
I will bring the prescription to your desk for the Rollator.  Oxygen may be decreased to 1 L at rest and 2 L with activity

## 2018-11-05 NOTE — Assessment & Plan Note (Addendum)
Quit smoking 09/2018 p presenting with acute hypercarbic/hypoxemic resp failure / acute cor pulmonale that improved  prior to initial pulmonary office visit 11/05/2018 as did hypercarbia  - The proper method of use, as well as anticipated side effects, of a metered-dose inhaler were discussed and demonstrated to the patient.   Group D in terms of symptom/risk and laba/lama/ICS  therefore appropriate rx at this point   >>>  Continue spiriva/wixella and f/u with pfts in 6 weeks  Critical of course she also stay off cigs

## 2018-11-06 NOTE — Telephone Encounter (Signed)
Will await response from [facility] or [employer]

## 2018-11-10 NOTE — Telephone Encounter (Signed)
Due to current protocols that have to be followed in order to get pt's scheduled for PFTs, only a certain number of pts can be scheduled at a time for PFT as pts have to be tested for COVID prior to being able to have PFT performed. This has already been routed to both [facility] whom are working on getting pts on the schedule for PFTS and COVID testing.

## 2018-11-11 NOTE — Telephone Encounter (Signed)
Waiting for PFT schedule.

## 2018-11-12 ENCOUNTER — Telehealth: Payer: Self-pay

## 2018-11-12 NOTE — Telephone Encounter (Signed)
Call made to patient, made aware we are still in the process of trying to get her scheduled for her covid test as well as her PFT. Voiced understanding.

## 2018-11-12 NOTE — Telephone Encounter (Signed)
Spoke to [facility] who confirmed that the rollator was delivered to the patient.

## 2018-11-13 NOTE — Telephone Encounter (Signed)
Called and scheduled patient for PFT and f/u with Dr. Melvyn Novas on 12/25/2018 -pr

## 2018-11-18 ENCOUNTER — Ambulatory Visit (HOSPITAL_COMMUNITY)
Admission: RE | Admit: 2018-11-18 | Discharge: 2018-11-18 | Disposition: A | Discharge status: Home / Self Care | Payer: BC Managed Care – PPO | Source: Ambulatory Visit | Attending: Cardiology | Admitting: Cardiology

## 2018-11-18 ENCOUNTER — Encounter (HOSPITAL_COMMUNITY): Payer: BC Managed Care – PPO

## 2018-11-18 ENCOUNTER — Other Ambulatory Visit: Payer: Self-pay

## 2018-11-18 VITALS — Wt 100.2 lb

## 2018-11-18 DIAGNOSIS — I2721 Secondary pulmonary arterial hypertension: Secondary | ICD-10-CM

## 2018-11-18 DIAGNOSIS — I5032 Chronic diastolic (congestive) heart failure: Secondary | ICD-10-CM

## 2018-11-18 DIAGNOSIS — J9611 Chronic respiratory failure with hypoxia: Secondary | ICD-10-CM

## 2018-11-18 NOTE — Progress Notes (Signed)
Heart Failure TeleHealth Note  Due to national recommendations of social distancing due to [place] 19, Audio/video telehealth visit is felt to be most appropriate for this patient at this time.  See MyChart message from today for patient consent regarding telehealth for [facility].  Date:  11/18/2018   ID:  [name], DOB 1955-11-09, MRN [id number]  Location: Home  Provider location: [place] [facility] Type of Visit: Established patient   PCP:  Antony Blackbird, MD  Cardiologist:  No primary care provider on file. Primary HF: Dr Haroldine Laws   Chief Complaint: Heart Failure   History of Present Illness: Evelyn Hughes is a 63 y.o. female with a history of tobacco abuse, COPD, pulmonary HTN, and chronic respiratory failure.   Ad[facility] to [facility] 09/27/2018 with AMS found to be hypoxic on arrival requiring intubation. She was recently admitted from 09/21/2018-09/25/2018 for similar symptoms found to havea combination of what was likely cor pulmonale and acute hypoxic respiratory distress and failure requiring intubation. She self extubated and was ultimately placed on nasal cannulaanddischarged home with oxygen. Perchart review, the patients husbandreporteda gradual declinesince hospital discharge.  Given this, her husbandcalledEMS on 09/27/2018 for transport to the ED for further evaluation. On their arrival, she was found to behypoxicand she was intubated withing 30 minutes of ED arrival. She was later extubated on 09/29/2018. Placed on IV lasix due to leg edema. Once diuresed, RHC was performed. RHC showed minimally elevated PA pressures and mildly elevated cardiac output. She was discharged on lasix 20 mg daily. Discharge weight was 111 pounds.   She presents via [employer] (consulting) for a telehealth visit today.   Overall feeling fine. She has been use oxygen intermittently. Denies SOB/PND/Orthopnea. Appetite ok. No fever or chills. Weight at home 100 pounds.   Taking all medications. [facility] by [facility].   she denies symptoms worrisome for COVID 19.   RHC 10/05/18  RA = 1 ( v waves to 10 due to TR) RV = 32/4 PA = 35/11 (24) PCW = 10 Fick cardiac output/index = 6.42/4.15 PVR = 2.2 WU Ao sat = 99% PA sat =79%, 80% SVC = 79%  ECHO 09/22/2018 LVEF 55-60%, RV normal  Past Medical History:  Diagnosis Date  . Allergy    Past Surgical History:  Procedure Laterality Date  . RIGHT HEART CATH N/A 10/05/2018   Procedure: RIGHT HEART CATH;  Surgeon: Jolaine Artist, MD;  Location: [place] [facility];  Service: Cardiovascular;  Laterality: N/A;     Current Outpatient Medications  Medication Sig Dispense Refill  . albuterol (VENTOLIN HFA) 108 (90 Base) MCG/ACT inhaler Inhale 2 puffs into the lungs every 6 (six) hours as needed for wheezing or shortness of breath. 1 Inhaler 2  . aspirin EC 81 MG tablet Take 81 mg by mouth daily.    . feeding supplement, ENSURE ENLIVE, (ENSURE ENLIVE) LIQD Take 237 mLs by mouth 2 (two) times daily between meals. 10 Bottle 0  . Fluticasone-Salmeterol (ADVAIR DISKUS) 100-50 MCG/DOSE AEPB Inhale 1 puff into the lungs 2 (two) times daily. 60 each 5  . furosemide (LASIX) 20 MG tablet Take 1 tablet (20 mg total) by mouth every other day. 30 tablet 5  . Misc. Devices (ROLLATOR ULTRA-LIGHT) MISC Please dispense Rollator as per insurance preference due to patient with gait instability and increased fall risk.  Needs seat/basket due to need to carry oxygen 1 each 0  . OXYGEN 1-2 lpm oxygen depending on level of  activity    . spironolactone (ALDACTONE) 25 MG tablet Take 1 tablet (25 mg total) by mouth daily. 30 tablet 5  . tiotropium (SPIRIVA HANDIHALER) 18 MCG inhalation capsule Place 1 capsule (18 mcg total) into inhaler and inhale daily for 30 days. 30 capsule 5   No current facility-administered medications for this encounter.     Allergies:   Sulfa antibiotics and Penicillins   Social History:  The patient   reports that she quit smoking about 8 weeks ago. Her smoking use included cigarettes. She has a 96.00 pack-year smoking history. She has never used smokeless tobacco. She reports that she does not drink alcohol or use drugs.   Family History:  The patient's family history includes Cancer in her mother.   ROS:  Please see the history of present illness.   All other systems are personally reviewed and negative.   Exam:  Tele Health Call; Exam is subjective  General:  Speaks in full sentences. No resp difficulty. Lungs: Normal respiratory effort with conversation.  Abdomen: Non-distended per patient report Extremities: Pt denies edema. Neuro: Alert & oriented x 3.   Recent Labs: 09/27/2018: ALT 234 09/28/2018: B Natriuretic Peptide 230.6 10/02/2018: TSH 4.123 10/04/2018: Magnesium 2.0 10/08/2018: BUN 17; Creatinine, Ser 0.42; Potassium 5.0; Sodium 136 11/04/2018: Hemoglobin 13.3; Platelets 433  Personally reviewed   Wt Readings from Last 3 Encounters:  11/05/18 46.3 kg (102 lb)  10/19/18 43.5 kg (96 lb)  10/08/18 49.8 kg (109 lb 12.8 oz)      ASSESSMENT AND PLAN:  1. 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 LE u/s negative for DVT -Improved with recent hospitalization. - Improving with diuresis and O2.  - 10/03/2018 VQnegative.  - RHC  Minimally elevated PA pressures and PVR 2.2.  - Continue home oxygen 2 liters daily.  - followed by Dr Melvyn Novas  2. Chronic respiratory failure due to advanced COPD - O2 sats on 2 liters oxygen.   3.Chronic diastolic HF -NYHA II. Volume status stable. Continue lasix every other day.   - Continue 25 mg spironolactone daily.   4. H/o Remote DVT due to OCPs - LE dopplers negative this admit - VQnegative   COVID screen The patient does not have any symptoms that suggest any further testing/ screening at this time.  Social distancing  reinforced today.  Patient Risk: After full review of this patients clinical status, I feel that they are at moderate risk for cardiac decompensation at this time.  Relevant cardiac medications were reviewed at length with the patient today. The patient does not have concerns regarding their medications at this time.   The following changes were made today:  None  Recommended follow-up:  Follow up in 3 months.   Today, I have spent 8  minutes with the patient with telehealth technology discussing the above issues .    Jeanmarie Hubert, NP  11/18/2018 9:50 AM  [place] [address] [facility] [place] [address] [phone] (office) [phone] (fax)

## 2018-11-23 ENCOUNTER — Telehealth: Payer: Self-pay

## 2018-11-23 NOTE — Telephone Encounter (Signed)
Signed orders for Home health faxed to [facility]

## 2018-12-15 ENCOUNTER — Other Ambulatory Visit: Payer: Self-pay | Admitting: Internal Medicine

## 2018-12-18 ENCOUNTER — Telehealth: Payer: Self-pay | Admitting: Nurse Practitioner

## 2018-12-21 NOTE — Telephone Encounter (Signed)
Seems like encounter was open in error so closing encounter.  

## 2018-12-22 ENCOUNTER — Other Ambulatory Visit (HOSPITAL_COMMUNITY): Payer: BC Managed Care – PPO | Attending: Internal Medicine

## 2018-12-25 ENCOUNTER — Ambulatory Visit: Payer: BC Managed Care – PPO | Admitting: Internal Medicine

## 2018-12-25 ENCOUNTER — Ambulatory Visit: Payer: BC Managed Care – PPO | Admitting: Nurse Practitioner

## 2019-02-09 NOTE — Progress Notes (Addendum)
Advanced Heart Failure Clinic Note   Date:  02/10/2019   ID:  Tayjah Orbach Sol, DOB 07-23-1955, MRN [id number]  Location: Home  Provider location: [place] [facility] Failure Type of Visit: Established patient   PCP:  Antony Blackbird, MD  Cardiologist:  No primary care provider on file. Primary HF: Dr Haroldine Laws   Chief Complaint: Heart Failure   History of Present Illness: Alexiea Rossetti Marlatt is a 63 y.o. female with a history of tobacco abuse, COPD, pulmonary HTN, and chronic respiratory failure.   Admitted to [facility] 09/27/2018 with AMS found to be hypoxic on arrival requiring intubation. She was later extubated on 09/29/2018. Placed on IV lasix due to leg edema. Once diuresed, RHC was performed. RHC showed minimally elevated PA pressures and mildly elevated cardiac output. She was discharged on lasix 20 mg daily. Discharge weight was 111 pounds.   She presents for routine f/u  Overall feeling great. Breathing much better. Use pulse ox and sats > 90% even with walking. Wear O2 at night. No edema,orthopnea or PND. Occasional dizziness.    RHC 10/05/18  RA = 1 ( v waves to 10 due to TR) RV = 32/4 PA = 35/11 (24) PCW = 10 Fick cardiac output/index = 6.42/4.15 PVR = 2.2 WU Ao sat = 99% PA sat =79%, 80% SVC = 79%  ECHO 09/22/2018 LVEF 55-60%, RV normal  Past Medical History:  Diagnosis Date  . Allergy    Past Surgical History:  Procedure Laterality Date  . RIGHT HEART CATH N/A 10/05/2018   Procedure: RIGHT HEART CATH;  Surgeon: Jolaine Artist, MD;  Location: [place] [facility];  Service: Cardiovascular;  Laterality: N/A;     Current Outpatient Medications  Medication Sig Dispense Refill  . albuterol (VENTOLIN HFA) 108 (90 Base) MCG/ACT inhaler Inhale 2 puffs into the lungs every 6 (six) hours as needed for wheezing or shortness of breath. 1 Inhaler 2  . aspirin EC 81 MG tablet Take 81 mg by mouth daily.    . feeding supplement, ENSURE ENLIVE, (ENSURE ENLIVE) LIQD Take  237 mLs by mouth 2 (two) times daily between meals. 10 Bottle 0  . Fluticasone-Salmeterol (ADVAIR DISKUS) 100-50 MCG/DOSE AEPB Inhale 1 puff into the lungs 2 (two) times daily. 60 each 5  . furosemide (LASIX) 20 MG tablet Take 1 tablet (20 mg total) by mouth every other day. 30 tablet 5  . nystatin (MYCOSTATIN) 100000 UNIT/ML suspension Take 5 mLs by mouth 3 (three) times daily as needed.    . OXYGEN 1-2 lpm oxygen depending on level of activity    . spironolactone (ALDACTONE) 25 MG tablet Take 1 tablet (25 mg total) by mouth daily. 30 tablet 5  . tiotropium (SPIRIVA HANDIHALER) 18 MCG inhalation capsule Place 1 capsule (18 mcg total) into inhaler and inhale daily for 30 days. 30 capsule 5   No current facility-administered medications for this encounter.     Allergies:   Sulfa antibiotics and Penicillins   Social History:  The patient  reports that she quit smoking about 4 months ago. Her smoking use included cigarettes. She has a 96.00 pack-year smoking history. She has never used smokeless tobacco. She reports that she does not drink alcohol or use drugs.   Family History:  The patient's family history includes Cancer in her mother.   ROS:  Please see the history of present illness.   All other systems are personally reviewed and negative.   Exam:   General:  Thin No resp difficulty HEENT: normal Neck: supple. no JVD. Carotids 2+ bilat; no bruits. No lymphadenopathy or thryomegaly appreciated. Cor: PMI nondisplaced. Regular rate & rhythm. No rubs, gallops or murmurs. Lungs: clear with decreased BS throughout Abdomen: soft, nontender, nondistended. No hepatosplenomegaly. No bruits or masses. Good bowel sounds. Extremities: no cyanosis, clubbing, rash, edema Neuro: alert & orientedx3, cranial nerves grossly intact. moves all 4 extremities w/o difficulty. Affect pleasant   Recent Labs: 09/27/2018: ALT 234 09/28/2018: B Natriuretic Peptide 230.6 10/02/2018: TSH 4.123 10/04/2018:  Magnesium 2.0 10/08/2018: BUN 17; Creatinine, Ser 0.42; Potassium 5.0; Sodium 136 11/04/2018: Hemoglobin 13.3; Platelets 433  Personally reviewed   Wt Readings from Last 3 Encounters:  02/10/19 50.5 kg (111 lb 6 oz)  11/18/18 45.5 kg (100 lb 3.2 oz)  11/05/18 46.3 kg (102 lb)      ASSESSMENT AND PLAN:  1. 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 LE u/s negative for DVT - Much improved with diuresis and O2. Only using O2 at night - 10/03/2018 VQnegative.  - RHC  Minimally elevated PA pressures and PVR 2.2.  - Continue home oxygen as needed - followed by Dr Melvyn Novas - Need to schedule PFTs - F/u echo in 6 months  2. Chronic respiratory failure due to advanced COPD - Walked all around clinic today without O2. Sat 93-98%. Continue O2 at night - Follows with Dr. Melvyn Novas - Check PFTs with DLCO   3.Chronic diastolic HF -NYHA II. Volume status stable. Continue lasix every other day.   - Continue 25 mg spironolactone daily.  - Check labs today BMET and BNP  4. H/o Remote DVT due to OCPs - LE dopplers negative  - VQnegative   Signed, Glori Bickers, MD  02/10/2019 12:27 PM  [place] [address] [facility] [place] [address] [phone] (office) [phone] (fax)

## 2019-02-10 ENCOUNTER — Encounter (HOSPITAL_COMMUNITY): Payer: Self-pay | Admitting: Internal Medicine

## 2019-02-10 ENCOUNTER — Ambulatory Visit (HOSPITAL_COMMUNITY)
Admission: RE | Admit: 2019-02-10 | Discharge: 2019-02-10 | Disposition: A | Discharge status: Home / Self Care | Payer: BC Managed Care – PPO | Source: Ambulatory Visit | Attending: Internal Medicine | Admitting: Internal Medicine

## 2019-02-10 VITALS — BP 122/60 | HR 91 | Wt 111.4 lb

## 2019-02-10 DIAGNOSIS — Z880 Allergy status to penicillin: Secondary | ICD-10-CM | POA: Diagnosis not present

## 2019-02-10 DIAGNOSIS — Z809 Family history of malignant neoplasm, unspecified: Secondary | ICD-10-CM | POA: Diagnosis not present

## 2019-02-10 DIAGNOSIS — Z9981 Dependence on supplemental oxygen: Secondary | ICD-10-CM | POA: Diagnosis not present

## 2019-02-10 DIAGNOSIS — I5032 Chronic diastolic (congestive) heart failure: Secondary | ICD-10-CM | POA: Diagnosis not present

## 2019-02-10 DIAGNOSIS — Z79899 Other long term (current) drug therapy: Secondary | ICD-10-CM | POA: Insufficient documentation

## 2019-02-10 DIAGNOSIS — J9611 Chronic respiratory failure with hypoxia: Secondary | ICD-10-CM | POA: Diagnosis not present

## 2019-02-10 DIAGNOSIS — J449 Chronic obstructive pulmonary disease, unspecified: Secondary | ICD-10-CM | POA: Diagnosis not present

## 2019-02-10 DIAGNOSIS — Z7951 Long term (current) use of inhaled steroids: Secondary | ICD-10-CM | POA: Diagnosis not present

## 2019-02-10 DIAGNOSIS — Z7982 Long term (current) use of aspirin: Secondary | ICD-10-CM | POA: Insufficient documentation

## 2019-02-10 DIAGNOSIS — Z87891 Personal history of nicotine dependence: Secondary | ICD-10-CM | POA: Diagnosis not present

## 2019-02-10 DIAGNOSIS — I272 Pulmonary hypertension, unspecified: Secondary | ICD-10-CM | POA: Insufficient documentation

## 2019-02-10 DIAGNOSIS — Z882 Allergy status to sulfonamides status: Secondary | ICD-10-CM | POA: Insufficient documentation

## 2019-02-10 DIAGNOSIS — I2721 Secondary pulmonary arterial hypertension: Secondary | ICD-10-CM | POA: Diagnosis not present

## 2019-02-10 DIAGNOSIS — Z86718 Personal history of other venous thrombosis and embolism: Secondary | ICD-10-CM | POA: Insufficient documentation

## 2019-02-10 DIAGNOSIS — J9610 Chronic respiratory failure, unspecified whether with hypoxia or hypercapnia: Secondary | ICD-10-CM | POA: Insufficient documentation

## 2019-02-10 LAB — BRAIN NATRIURETIC PEPTIDE: B Natriuretic Peptide: 41.7 pg/mL (ref 0.0–100.0)

## 2019-02-10 LAB — BASIC METABOLIC PANEL
Anion gap: 9 (ref 5–15)
BUN: 15 mg/dL (ref 8–23)
CO2: 28 mmol/L (ref 22–32)
Calcium: 9.7 mg/dL (ref 8.9–10.3)
Chloride: 101 mmol/L (ref 98–111)
Creatinine, Ser: 0.72 mg/dL (ref 0.44–1.00)
GFR calc Af Amer: >60 mL/min (ref >60)
GFR calc non Af Amer: >60 mL/min (ref >60)
Glucose, Bld: 91 mg/dL (ref 70–99)
Potassium: 4.7 mmol/L (ref 3.5–5.1)
Sodium: 138 mmol/L (ref 135–145)

## 2019-02-10 NOTE — Patient Instructions (Addendum)
Labs done today. We will contact you only if your labs are abnormal.  Please follow up with Dr. Melvyn Novas soon  Your physician has recommended that you have a pulmonary function test. Pulmonary Function Tests are a group of tests that measure how well air moves in and out of your lungs.we will contact you to schedule this appointment.   You will need to have a COVID-19 test done prior to having the pulmonary function test. We will contact you to schedule that appointment. (please refer to handout for directions to the COVID-19 testing location.   Your physician has requested that you have an echocardiogram in 6 months. Echocardiography is a painless test that uses sound waves to create images of your heart. It provides your doctor with information about the size and shape of your heart and how well your heart's chambers and valves are working. This procedure takes approximately one hour. There are no restrictions for this procedure. We will contact you to schedule this appointment.   At the [facility], you and your health needs are our priority. As part of our continuing mission to provide you with exceptional heart care, we have created designated Provider Care Teams. These Care Teams include your primary Cardiologist (physician) and Advanced Practice Providers (APPs- Physician Assistants and Nurse Practitioners) who all work together to provide you with the care you need, when you need it.   You may see any of the following providers on your designated Care Team at your next follow up: Marland Kitchen Dr Glori Bickers . Dr Loralie Champagne . Darrick Grinder, NP   Please be sure to bring in all your medications bottles to every appointment.

## 2019-02-10 NOTE — Progress Notes (Signed)
Pt ambulated around clinic on RA, O2 sats ranged 93-98%

## 2019-02-22 ENCOUNTER — Telehealth (HOSPITAL_COMMUNITY): Payer: Self-pay | Admitting: Vascular Surgery

## 2019-02-22 NOTE — Telephone Encounter (Signed)
Left pt detailed message giving pft appt 10/19 @ 10am asked pt to call back to confirm appt

## 2019-03-04 ENCOUNTER — Other Ambulatory Visit (HOSPITAL_COMMUNITY)
Admission: RE | Admit: 2019-03-04 | Discharge: 2019-03-04 | Disposition: A | Discharge status: Home / Self Care | Payer: BC Managed Care – PPO | Source: Ambulatory Visit | Attending: Internal Medicine | Admitting: Internal Medicine

## 2019-03-04 DIAGNOSIS — Z01812 Encounter for preprocedural laboratory examination: Secondary | ICD-10-CM | POA: Diagnosis not present

## 2019-03-04 DIAGNOSIS — Z20828 Contact with and (suspected) exposure to other viral communicable diseases: Secondary | ICD-10-CM | POA: Insufficient documentation

## 2019-03-06 LAB — NOVEL CORONAVIRUS, NAA (HOSP ORDER, SEND-OUT TO REF LAB; TAT 18-24 HRS): SARS-CoV-2, NAA: NOT DETECTED

## 2019-03-08 ENCOUNTER — Ambulatory Visit (HOSPITAL_COMMUNITY)
Admission: RE | Admit: 2019-03-08 | Discharge: 2019-03-08 | Disposition: A | Discharge status: Home / Self Care | Payer: BC Managed Care – PPO | Source: Ambulatory Visit | Attending: Internal Medicine | Admitting: Internal Medicine

## 2019-03-08 ENCOUNTER — Other Ambulatory Visit: Payer: Self-pay

## 2019-03-08 DIAGNOSIS — I2721 Secondary pulmonary arterial hypertension: Secondary | ICD-10-CM | POA: Insufficient documentation

## 2019-03-08 DIAGNOSIS — I5032 Chronic diastolic (congestive) heart failure: Secondary | ICD-10-CM | POA: Diagnosis not present

## 2019-03-08 LAB — PULMONARY FUNCTION TEST
DL/VA % pred: 81 %
DL/VA: 3.36 ml/min/mmHg/L
DLCO unc % pred: 57 %
DLCO unc: 12.34 ml/min/mmHg
FEF 25-75 Post: 0.38 L/sec
FEF 25-75 Pre: 0.36 L/sec
FEF2575-%Change-Post: 5 %
FEF2575-%Pred-Post: 15 %
FEF2575-%Pred-Pre: 14 %
FEV1-%Change-Post: 5 %
FEV1-%Pred-Post: 39 %
FEV1-%Pred-Pre: 37 %
FEV1-Post: 1.06 L
FEV1-Pre: 1.01 L
FEV1FVC-%Change-Post: 4 %
FEV1FVC-%Pred-Pre: 59 %
FEV6-%Change-Post: 1 %
FEV6-%Pred-Post: 59 %
FEV6-%Pred-Pre: 58 %
FEV6-Post: 2 L
FEV6-Pre: 1.98 L
FEV6FVC-%Change-Post: 0 %
FEV6FVC-%Pred-Post: 94 %
FEV6FVC-%Pred-Pre: 94 %
FVC-%Change-Post: 0 %
FVC-%Pred-Post: 62 %
FVC-%Pred-Pre: 62 %
FVC-Post: 2.19 L
FVC-Pre: 2.17 L
Post FEV1/FVC ratio: 48 %
Post FEV6/FVC ratio: 92 %
Pre FEV1/FVC ratio: 47 %
Pre FEV6/FVC Ratio: 91 %
RV % pred: 204 %
RV: 4.35 L
TLC % pred: 121 %
TLC: 6.5 L

## 2019-03-08 MED ORDER — ALBUTEROL SULFATE (2.5 MG/3ML) 0.083% IN NEBU
2.5000 mg | INHALATION_SOLUTION | Freq: Once | RESPIRATORY_TRACT | Status: AC
  Administered 2019-03-08: 11:00:00 2.5 mg via RESPIRATORY_TRACT

## 2019-03-10 ENCOUNTER — Ambulatory Visit (INDEPENDENT_AMBULATORY_CARE_PROVIDER_SITE_OTHER): Payer: BC Managed Care – PPO | Admitting: Internal Medicine

## 2019-03-10 ENCOUNTER — Encounter: Payer: Self-pay | Admitting: Internal Medicine

## 2019-03-10 ENCOUNTER — Other Ambulatory Visit: Payer: Self-pay

## 2019-03-10 DIAGNOSIS — J449 Chronic obstructive pulmonary disease, unspecified: Secondary | ICD-10-CM | POA: Diagnosis not present

## 2019-03-10 NOTE — Patient Instructions (Signed)
No change in medications or 02  Try to stay as active as you can and measure your 02 at peak exercise levels a few times a week  Please schedule a follow up visit in 3 months but call sooner if needed

## 2019-03-10 NOTE — Progress Notes (Signed)
Evelyn Hughes, female    DOB: 05-01-1956    MRN: [id number]   Brief patient profile:  48 yowf quit smoking 09/2018 with freq cough dx as bronchitis growing up in house of smokers has used inhalers as needed short term only with good activity tolerance then new leg swelling April 2020 eventually admitted x 2    Admit date: 09/27/2018 Discharge date: 10/06/2018    Discharge Diagnoses:      Active Hospital Problems   Diagnosis Date Noted  . Pressure injury of skin 09/28/2018  . Respiratory failure with hypoxia and hypercapnia ([place]) 09/27/2018           Vitals:   10/06/18 0847 10/06/18 0848  BP:    Pulse:    Resp:    Temp:    SpO2: 98% 98%    History of present illness:  The patient is an ill-appearing 63 year old female, current every day smoker recently admitted to the hospital ( 5/4-5/8) and ultimately diagnosed with a combination of what was likely cor pulmonale and acute hypoxic respiratory distress and failure requiring intubation. She self extubated and was ultimately placed on nasal cannula and discharged home with oxygen. Per the husband's report and per EMS report the patient has had a gradual decline especially over the last 12 to 24 hours refusing to eat or drink with AMS. Husband called 72, Paramedics found the patient to be hypoxic, Increased her oxygen and transported her to the ED. She was intubated within 30 minutes of arrival. PCCM admitted to ICU and managed care.  TRH assumed care on 09/30/2018.  Admission 5/4-5/8 with intubation for respiratory failure Re-admission 5/10 for Acute on chronic Respiratory Failure Extubated on 09/29/18  5/4 CT abd/ pelvis >> 1. No acute intra-abdominal process. 2. Small ascites and mild anasarca.  5/4 CT chest >> 1. Moderate right and small left pleural effusions. No pneumonia. 2. Mild right heart enlargement with dilated main pulmonary artery, suggestive of pulmonary arterial hypertension. 3.  Emphysema  4. Aortic atherosclerosis  5/4 CTH >> Minimal frontal and parietal lobe atrophy. Otherwise negative exam.  09/22/2018 Echo The left ventricle has normal systolic function, with an ejection fraction of 55-60%. The cavity size was normal. Left ventricular diastolic function could not be evaluated. The right ventricle has normal systolc function. The cavity was mildly enlarged. Right ventricular systolic pressure is mildly elevated with an estimated pressure of 46.6 mmHg. Right atrial size was mildly dilated. The mitral valve is grossly normal. The tricuspid valve was grossly normal. The aortic valve is tricuspid No stenosis of the aortic valve. Limited study; normal LV function; mild RAE and RVE; moderate RV dysfunction; trace TR; mild pulmonary hypertension.  5/5 Doppler Studies Right: There is no evidence of deep vein thrombosis in the lower extremity. No cystic structure found in the popliteal fossa. Left: There is no evidence of deep vein thrombosis in the lower extremity. No cystic structure found in the popliteal fossa. Micro Data:  5/10 Blood Cultures x 2 5/10 Tracheal Aspirate- GPCs 5/10 Urine Legionella 5/10 Urine Strep 5/10 Covid Negative  Antimicrobials:  Completed Cefepime  Completed IV Vanc   Started on po Azithromycin x 5 days on 10/06/18 for her COPD  10/06/18: Patient seen and examined at her bedside.  No acute events overnight.  RHC was done yesterday.  Patient will follow-up with cardiology outpatient.  She has no new complaints.  She denies chest pain or dyspnea.  Admits to intermittent nonproductive cough with no subjective fevers or  chills.  States she feels well.  Bilateral lower extremity edema improving.  TED hose in place.  She wants to go home.  Mild leukocytosis which is improving.  Procalcitonin obtained and is negative.  Started on p.o. azithromycin x5 days for its anti-inflammatory properties.   On the day of discharge, the  patient was hemodynamically stable.  She will need to follow-up with her primary care provider, cardiology, and pulmonology posthospitalization.    Hospital Course:  Active Problems:   Respiratory failure with hypoxia and hypercapnia (HCC)   Pressure injury of skin  Resolving acute hypoxic hypercarbic respiratory failure, multifactorial secondary to acute volume overload, bilateral pleural effusion, COPD exacerbation Intubated on 09/27/2018.  Extubated on 09/29/2018 Currently on 2 L of oxygen by nasal cannula and saturating 97% Completed 5 days of IV cefepime Independent reviewed chest x-ray done on 10/03/2018 which showed small bilateral pleural effusions and right lower lobe atelectasis Patient encouraged to use incentive spirometer. 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 and right ventricular failure Heart failure team is following Continue diuresis as recommended by heart failure team Continue TED hose per heart failure team V/Qshowed very low probability PFTs with DLCOpending Pulmonary rehab on discharge Net I&O -2.5 L since admission Continue cardiac medications On Lasix 20 mg daily and spironolactone 25 mg daily as recommended by cardiology Follow-up with cardiology outpatient  Bilateral lower extremity 2+ pitting edema, improving with TED hose Improving with TED hose and diuretics  Bilateral Doppler ultrasound negative for DVT No obvious discoloration or hyperpigmentation  Emphysema COPD exacerbation Counseled on the importance of tobacco use cessation Maintain O2 saturation greater than 90% Continue inhalers Started Z-Pak, azithromycin 250 mg daily x5 days  Leukocytosis in the setting of recent CAP and steroid use Leukocytosis improving with  WBC 16 K from 20 K Procalcitonin is negative Afebrile Continue p.o. azithromycin to 50 mg daily x5 days  Resolved hypovolemic hyponatremia   Resolved hypomagnesemia post repletion  Tobacco use disorder Counseled on tobacco cessation at bedside Continue nicotine patch        History of Present Illness  11/05/2018  Pulmonary/ 1st office eval/Magon Croson  Chief Complaint  Patient presents with  . Pulmonary Consult    recent hospital admission for cor pulmonale and acute respiratory distress requiring intubation. She states that as far as she is aware she has never had any problems at all with her breathing and she denies any respiratory co's today.   Dyspnea:  Able to walk 13 min s 02 improving spiriva/wixella  Cough: none now Sleep: on 02 2lpm and able to sleep on side  Bed flat  SABA use: ventolin but not needing  rec 02 2lpm at bedtime and adjust during the day to keep it over 90% No change in your pulmonary medications   Please schedule a follow up office visit in 6 weeks, call sooner if needed with all medications   03/10/2019  f/u ov/Halo Shevlin re:  GOLD III advair/ spiriva Chief Complaint  Patient presents with  . Follow-up    PFT done 03/08/19. Pt states her breathing has been doing well. No co's today.   Dyspnea:  No regular sustained walking but able to cross parking lot and do a [employer] sats in mid 90s RA Cough: no Sleeping: no resp symptoms, 2 pillows SABA use: never  02: 2lpm   hs only    No obvious day to day or daytime variability or assoc excess/ purulent sputum or mucus plugs or hemoptysis or cp or chest tightness, subjective wheeze or overt sinus or hb symptoms.   Sleeping as above  without nocturnal  or early am exacerbation  of respiratory  c/o's or need for noct saba. Also denies any obvious fluctuation of symptoms with weather or environmental changes or other aggravating or alleviating factors except as outlined above   No unusual exposure hx or h/o  childhood pna/ asthma or knowledge of premature birth.  Current Allergies, Complete Past Medical History, Past Surgical History, Family History, and Social History were reviewed in [employer] record.  ROS  The following are not active complaints unless bolded Hoarseness, sore throat, dysphagia, dental problems, itching, sneezing,  nasal congestion or discharge of excess mucus or purulent secretions, ear ache,   fever, chills, sweats, unintended wt loss or wt gain, classically pleuritic or exertional cp,  orthopnea pnd or arm/hand swelling  or leg swelling, presyncope, palpitations, abdominal pain, anorexia, nausea, vomiting, diarrhea  or change in bowel habits or change in bladder habits, change in stools or change in urine, dysuria, hematuria,  rash, arthralgias, visual complaints, headache, numbness, weakness or ataxia or problems with walking or coordination,  change in mood or  memory.        Current Meds  Medication Sig  . albuterol (VENTOLIN HFA) 108 (90 Base) MCG/ACT inhaler Inhale 2 puffs into the lungs every 6 (six) hours as needed for wheezing or shortness of breath.  . Fluticasone-Salmeterol (ADVAIR DISKUS) 100-50 MCG/DOSE AEPB Inhale 1 puff into the lungs 2 (two) times daily.  . furosemide (LASIX) 20 MG tablet Take 1 tablet (20 mg total) by mouth every other day.  . OXYGEN 1-2 lpm oxygen depending on level of activity  . spironolactone (ALDACTONE) 25 MG tablet Take 1 tablet (25 mg total) by mouth daily.  Marland Kitchen tiotropium (SPIRIVA HANDIHALER) 18 MCG inhalation capsule Place 1 capsule (18 mcg total) into inhaler and inhale daily for 30 days.                 Objective:       Wt Readings from Last 3 Encounters:  03/10/19 113 lb (51.3 kg)  02/10/19 111 lb 6 oz (50.5 kg)  11/18/18 100 lb 3.2 oz (45.5 kg)     Vital signs reviewed - Note on arrival 02 sats  99% on RA        HEENT : pt wearing mask not removed for exam due to covid -19 concerns.    NECK  :  without JVD/Nodes/TM/ nl carotid upstrokes bilaterally   LUNGS: no acc muscle use,  Mod barrel  contour chest wall with bilateral  Distant bs s audible wheeze and  without cough on insp or exp maneuvers and mod  Hyperresonant  to  percussion bilaterally     CV:  RRR  no s3 or murmur or increase in P2, and no edema   ABD:  soft and nontender with pos mid insp Hoover's  in the supine position. No bruits or organomegaly appreciated, bowel sounds nl  MS:     ext warm without deformities, calf tenderness, cyanosis or clubbing No obvious joint restrictions   SKIN: warm and dry without lesions    NEURO:  alert, approp, nl sensorium with  no motor or cerebellar deficits apparent.  Assessment

## 2019-03-11 ENCOUNTER — Encounter: Payer: Self-pay | Admitting: Internal Medicine

## 2019-03-11 NOTE — Assessment & Plan Note (Signed)
Quit smoking 09/2018 p presenting with acute hypercarbic/hypoxemic resp failure / acute cor pulmonale that improved  prior to initial pulmonary office visit 11/05/2018 as did hypercarbia  - PFT's  03/08/2019  FEV1 1.06 (39 % ) ratio 0.48  p 5 % improvement from saba p nothing prior to study with DLCO  12.34 (57%) corrects to 3.36 (81%)  for alv volume and FV curve classic concave curvature on exp loop  - 03/10/2019  After extensive coaching inhaler device,  effectiveness =  75%     Group D in terms of symptom/risk and laba/lama/ICS  therefore appropriate rx at this point >>>  No change rx for now though good candidate for Trelegy if insurance will cover as this would simplify her care    I had an extended discussion with the patient reviewing all relevant studies completed to date and  lasting 15 to 20 minutes of a 25 minute visit    I performed detailed device teaching using a teach back method which extended face to face time for this visit (see above)  Each maintenance medication was reviewed in detail including emphasizing most importantly the difference between maintenance and prns and under what circumstances the prns are to be triggered using an action plan format that is not reflected in the computer generated alphabetically organized AVS which I have not found useful in most complex patients, especially with respiratory illnesses  Please see AVS for specific instructions unique to this visit that I personally wrote and verbalized to the the pt in detail and then reviewed with pt  by my nurse highlighting any  changes in therapy recommended at today's visit to their plan of care.

## 2019-04-29 ENCOUNTER — Telehealth (HOSPITAL_COMMUNITY): Payer: Self-pay | Admitting: Pharmacist

## 2019-04-29 ENCOUNTER — Telehealth: Payer: Self-pay | Admitting: Internal Medicine

## 2019-04-29 ENCOUNTER — Other Ambulatory Visit: Payer: Self-pay | Admitting: Family Medicine

## 2019-04-29 DIAGNOSIS — I509 Heart failure, unspecified: Secondary | ICD-10-CM

## 2019-04-29 DIAGNOSIS — B370 Candidal stomatitis: Secondary | ICD-10-CM

## 2019-04-29 MED ORDER — MAGIC MOUTHWASH: 5.0000 mL | Freq: Three times a day (TID) | ORAL | 0 refills | Status: DC

## 2019-04-29 MED ORDER — SPIRONOLACTONE 25 MG PO TABS: 25.0000 mg | ORAL_TABLET | Freq: Every day | ORAL | 5 refills | Status: DC

## 2019-04-29 NOTE — Telephone Encounter (Signed)
I called and spoke with the patient and she states that she has a sore throat and visible white patches on her tongue and roof of her mouth. She states that its painful. She is requesting magic mouth wash. Please advise.

## 2019-04-29 NOTE — Telephone Encounter (Signed)
Ok to use the magic mouthwash s lidocaine x 250 cc but it's the advair that's bothering her throat so rec try cut down to once a day and rinse and gargle after use

## 2019-04-29 NOTE — Telephone Encounter (Signed)
Spoke with the pt and notified of recs per MW and she verbalized understanding  I have called in the MMW

## 2019-04-29 NOTE — Telephone Encounter (Signed)
Sent in refills for spironolactone 25 mg daily to [facility] per patient request.

## 2019-06-10 ENCOUNTER — Ambulatory Visit: Payer: BC Managed Care – PPO | Admitting: Internal Medicine

## 2019-06-11 ENCOUNTER — Other Ambulatory Visit: Payer: Self-pay

## 2019-06-11 ENCOUNTER — Encounter: Payer: Self-pay | Admitting: Internal Medicine

## 2019-06-11 ENCOUNTER — Ambulatory Visit (INDEPENDENT_AMBULATORY_CARE_PROVIDER_SITE_OTHER): Payer: BC Managed Care – PPO | Admitting: Internal Medicine

## 2019-06-11 DIAGNOSIS — J449 Chronic obstructive pulmonary disease, unspecified: Secondary | ICD-10-CM

## 2019-06-11 NOTE — Assessment & Plan Note (Signed)
Quit smoking 09/2018 p presenting with acute hypercarbic/hypoxemic resp failure / acute cor pulmonale that improved  prior to initial pulmonary office visit 11/05/2018 as did hypercarbia  - PFT's  03/08/2019  FEV1 1.06 (39 % ) ratio 0.48  p 5 % improvement from saba p nothing prior to study with DLCO  12.34 (57%) corrects to 3.36 (81%)  for alv volume and FV curve classic concave curvature on exp loop  - 03/10/2019  After extensive coaching inhaler device,  effectiveness =  75%     Group D in terms of symptom/risk and laba/lama/ICS  therefore appropriate rx at this point >>>  Continue advair/ spiriva but note due to recurrent upper airway symptoms rec change to hfa next ov = Breztri if covered    Pt informed of the seriousness of COVID 19 infection as a direct risk to lung health  and safey and to close contacts and should continue to wear a facemask in public and minimize exposure to public locations but especially avoid any area or activity where non-close contacts are not observing distancing or wearing an appropriate face mask.  I strongly recommended vaccine when offered.    >>> f/u in 6 m to avoid exposure in meantime  - needs alpha one screening on return and discuss screening CT

## 2019-06-11 NOTE — Patient Instructions (Addendum)
No change in medications for now but Evelyn Hughes may be a better option for you when we see you    Stay as active as you can and get the vaccine for Covid 19  as soon as it's offered.   Please schedule a follow up visit in 6 months but call sooner if needed with your drug formulary if available for a list of cheapest alternatives to your spiriva and advair which tend to cause mouth and throat irritation Add: will need alpha one screening on return (I'll explain that, it's just a simple blood test)     .

## 2019-06-11 NOTE — Progress Notes (Signed)
Evelyn Hughes, female    DOB: 05-03-1956    MRN: [id number]   Brief patient profile:  10 yowf quit smoking 09/2018 with freq cough dx as bronchitis growing up in house of smokers has used inhalers as needed short term only with good activity tolerance then new leg swelling April 2020 eventually admitted x 2    Admit date: 09/27/2018 Discharge date: 10/06/2018    Discharge Diagnoses:      Active Hospital Problems   Diagnosis Date Noted  . Pressure injury of skin 09/28/2018  . Respiratory failure with hypoxia and hypercapnia ([place]) 09/27/2018           Vitals:   10/06/18 0847 10/06/18 0848  BP:    Pulse:    Resp:    Temp:    SpO2: 98% 98%    History of present illness:  The patient is an ill-appearing 64 year old female, current every day smoker recently admitted to the hospital ( 5/4-5/8) and ultimately diagnosed with a combination of what was likely cor pulmonale and acute hypoxic respiratory distress and failure requiring intubation. She self extubated and was ultimately placed on nasal cannula and discharged home with oxygen. Per the husband's report and per EMS report the patient has had a gradual decline especially over the last 12 to 24 hours refusing to eat or drink with AMS. Husband called 7, Paramedics found the patient to be hypoxic, Increased her oxygen and transported her to the ED. She was intubated within 30 minutes of arrival. PCCM admitted to ICU and managed care.  [facility] assumed care on 09/30/2018.  Admission 5/4-5/8 with intubation for respiratory failure Re-admission 5/10 for Acute on chronic Respiratory Failure Extubated on 09/29/18  5/4 CT abd/ pelvis >> 1. No acute intra-abdominal process. 2. Small ascites and mild anasarca.  5/4 CT chest >> 1. Moderate right and small left pleural effusions. No pneumonia. 2. Mild right heart enlargement with dilated main pulmonary artery, suggestive of pulmonary arterial hypertension. 3.  Emphysema  4. Aortic atherosclerosis  5/4 CTH >> Minimal frontal and parietal lobe atrophy. Otherwise negative exam.  09/22/2018 Echo The left ventricle has normal systolic function, with an ejection fraction of 55-60%. The cavity size was normal. Left ventricular diastolic function could not be evaluated. The right ventricle has normal systolc function. The cavity was mildly enlarged. Right ventricular systolic pressure is mildly elevated with an estimated pressure of 46.6 mmHg. Right atrial size was mildly dilated. The mitral valve is grossly normal. The tricuspid valve was grossly normal. The aortic valve is tricuspid No stenosis of the aortic valve. Limited study; normal LV function; mild RAE and RVE; moderate RV dysfunction; trace TR; mild pulmonary hypertension.  5/5 Doppler Studies Right: There is no evidence of deep vein thrombosis in the lower extremity. No cystic structure found in the popliteal fossa. Left: There is no evidence of deep vein thrombosis in the lower extremity. No cystic structure found in the popliteal fossa. Micro Data:  5/10 Blood Cultures x 2 5/10 Tracheal Aspirate- GPCs 5/10 Urine Legionella 5/10 Urine Strep 5/10 Covid Negative  Antimicrobials:  Completed Cefepime  Completed IV Vanc   Started on po Azithromycin x 5 days on 10/06/18 for her COPD  10/06/18: Patient seen and examined at her bedside.  No acute events overnight.  RHC was done yesterday.  Patient will follow-up with cardiology outpatient.  She has no new complaints.  She denies chest pain or dyspnea.  Admits to intermittent nonproductive cough with no subjective fevers or  chills.  States she feels well.  Bilateral lower extremity edema improving.  TED hose in place.  She wants to go home.  Mild leukocytosis which is improving.  Procalcitonin obtained and is negative.  Started on p.o. azithromycin x5 days for its anti-inflammatory properties.   On the day of discharge, the  patient was hemodynamically stable.  She will need to follow-up with her primary care provider, cardiology, and pulmonology posthospitalization.    Hospital Course:  Active Problems:   Respiratory failure with hypoxia and hypercapnia (HCC)   Pressure injury of skin  Resolving acute hypoxic hypercarbic respiratory failure, multifactorial secondary to acute volume overload, bilateral pleural effusion, COPD exacerbation Intubated on 09/27/2018.  Extubated on 09/29/2018 Currently on 2 L of oxygen by nasal cannula and saturating 97% Completed 5 days of IV cefepime Independent reviewed chest x-ray done on 10/03/2018 which showed small bilateral pleural effusions and right lower lobe atelectasis Patient encouraged to use incentive spirometer. 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 and right ventricular failure Heart failure team is following Continue diuresis as recommended by heart failure team Continue TED hose per heart failure team V/Qshowed very low probability PFTs with DLCOpending Pulmonary rehab on discharge Net I&O -2.5 L since admission Continue cardiac medications On Lasix 20 mg daily and spironolactone 25 mg daily as recommended by cardiology Follow-up with cardiology outpatient  Bilateral lower extremity 2+ pitting edema, improving with TED hose Improving with TED hose and diuretics  Bilateral Doppler ultrasound negative for DVT No obvious discoloration or hyperpigmentation  Emphysema COPD exacerbation Counseled on the importance of tobacco use cessation Maintain O2 saturation greater than 90% Continue inhalers Started Z-Pak, azithromycin 250 mg daily x5 days  Leukocytosis in the setting of recent CAP and steroid use Leukocytosis improving with  WBC 16 K from 20 K Procalcitonin is negative Afebrile Continue p.o. azithromycin to 50 mg daily x5 days  Resolved hypovolemic hyponatremia   Resolved hypomagnesemia post repletion  Tobacco use disorder Counseled on tobacco cessation at bedside Continue nicotine patch        History of Present Illness  11/05/2018  Pulmonary/ 1st office eval/Evelyn Hughes  Chief Complaint  Patient presents with  . Pulmonary Consult    recent hospital admission for cor pulmonale and acute respiratory distress requiring intubation. She states that as far as she is aware she has never had any problems at all with her breathing and she denies any respiratory co's today.   Dyspnea:  Able to walk 13 min s 02 improving spiriva/wixella  Cough: none now Sleep: on 02 2lpm and able to sleep on side  Bed flat  SABA use: ventolin but not needing  rec 02 2lpm at bedtime and adjust during the day to keep it over 90% No change in your pulmonary medications   Please schedule a follow up office visit in 6 weeks, call sooner if needed with all medications   03/10/2019  f/u ov/Evelyn Hughes re:  GOLD III advair/ spiriva Chief Complaint  Patient presents with  . Follow-up    PFT done 03/08/19. Pt states her breathing has been doing well. No co's today.   Dyspnea:  No regular sustained walking but able to cross parking lot and do a [employer] sats in mid 90s RA Cough: no Sleeping: no resp symptoms, 2 pillows SABA use: never  02: 2lpm  hs only  rec No change in medications or 02 Try to stay as active as you can and measure your 02 at peak exercise levels a few times a week   05/09/2019 PC : thrush requesting MMW > rx   Virtual Visit via Telephone Note 06/11/2019   I connected with Evelyn Hughes on 06/11/19 at 9:15  AM EST by telephone and verified that I am speaking with the correct person using two identifiers.   I discussed the limitations, risks, security and privacy concerns of performing an evaluation and  management service by telephone and the availability of in person appointments. I also discussed with the patient that there may be a patient responsible charge related to this service. The patient expressed understanding and agreed to proceed.   History of Present Illness: GOLD III/ advair / spiriva  Dyspnea: no change in doe but very sedentary  Cough: no  Sleeping: no resp problem bed is flat, 2 pillows SABA use: not needing  02: uses 2lpm hs/ does fine with walking at 93% RA   No obvious day to day or daytime variability or assoc excess/ purulent sputum or mucus plugs or hemoptysis or cp or chest tightness, subjective wheeze or overt sinus or hb symptoms.    Also denies any obvious fluctuation of symptoms with weather or environmental changes or other aggravating or alleviating factors except as outlined above.   Meds reviewed/ med reconciliation completed         Observations/Objective: No conversational sob/ mild hoarsness/ dry sounding cough    Assessment and Plan: See problem list for active a/p's   Follow Up Instructions: See avs for instructions unique to this ov which includes revised/ updated med list     I discussed the assessment and treatment plan with the patient. The patient was provided an opportunity to ask questions and all were answered. The patient agreed with the plan and demonstrated an understanding of the instructions.   The patient was advised to call back or seek an in-person evaluation if the symptoms worsen or if the condition fails to improve as anticipated.  I provided  15 minutes of non-face-to-face time during this encounter.   Christinia Gully, MD

## 2019-06-29 ENCOUNTER — Other Ambulatory Visit: Payer: Self-pay | Admitting: Family Medicine

## 2019-06-29 DIAGNOSIS — J9611 Chronic respiratory failure with hypoxia: Secondary | ICD-10-CM

## 2019-06-29 DIAGNOSIS — Z9981 Dependence on supplemental oxygen: Secondary | ICD-10-CM

## 2019-07-16 ENCOUNTER — Telehealth: Payer: Self-pay | Admitting: Internal Medicine

## 2019-07-16 DIAGNOSIS — J9611 Chronic respiratory failure with hypoxia: Secondary | ICD-10-CM

## 2019-07-16 DIAGNOSIS — Z9981 Dependence on supplemental oxygen: Secondary | ICD-10-CM

## 2019-07-16 MED ORDER — SPIRIVA HANDIHALER 18 MCG IN CAPS: ORAL_CAPSULE | RESPIRATORY_TRACT | 3 refills | Status: DC

## 2019-07-16 MED ORDER — FLUTICASONE-SALMETEROL 100-50 MCG/DOSE IN AEPB: 1.0000 | INHALATION_SPRAY | Freq: Two times a day (BID) | RESPIRATORY_TRACT | 3 refills | Status: DC

## 2019-07-16 NOTE — Telephone Encounter (Signed)
I have refilled both medications and I have called and spoke with the patient and made her aware.

## 2019-08-12 ENCOUNTER — Ambulatory Visit: Payer: BC Managed Care – PPO | Attending: Internal Medicine

## 2019-08-12 DIAGNOSIS — Z23 Encounter for immunization: Secondary | ICD-10-CM

## 2019-08-12 NOTE — Progress Notes (Signed)
   Covid-19 Vaccination Clinic  Name:  Evelyn Hughes    MRN: [id number] DOB: [birth year]  08/12/2019  Evelyn Hughes was observed post Covid-19 immunization for 30 minutes based on pre-vaccination screening without incident. She was provided with Vaccine Information Sheet and instruction to access the V-Safe system.   Evelyn Hughes was instructed to call 911 with any severe reactions post vaccine: Marland Kitchen Difficulty breathing  . Swelling of face and throat  . A fast heartbeat  . A bad rash all over body  . Dizziness and weakness   Immunizations Administered    Name Date Dose VIS Date Route   Pfizer COVID-19 Vaccine 08/12/2019 10:07 AM 0.3 mL 04/30/2019 Intramuscular   Manufacturer: [place]   Lot: [id number]   [place]: [id number]

## 2019-09-06 ENCOUNTER — Ambulatory Visit: Payer: BC Managed Care – PPO | Attending: Internal Medicine

## 2019-09-06 DIAGNOSIS — Z23 Encounter for immunization: Secondary | ICD-10-CM

## 2019-09-06 NOTE — Progress Notes (Signed)
   Covid-19 Vaccination Clinic  Name:  SHACOYA CREGG    MRN: [id number] DOB: 1956/01/07  09/06/2019  Ms. Rothe was observed post Covid-19 immunization for 30 minutes based on pre-vaccination screening without incident. She was provided with Vaccine Information Sheet and instruction to access the V-Safe system.   Ms. Arman was instructed to call 911 with any severe reactions post vaccine: Marland Kitchen Difficulty breathing  . Swelling of face and throat  . A fast heartbeat  . A bad rash all over body  . Dizziness and weakness   Immunizations Administered    Name Date Dose VIS Date Route   Pfizer COVID-19 Vaccine 09/06/2019 11:13 AM 0.3 mL 07/14/2018 Intramuscular   Manufacturer: [place]   Lot: [id number]   [place]: [id number]

## 2019-10-15 ENCOUNTER — Telehealth: Payer: Self-pay | Admitting: Internal Medicine

## 2019-10-15 NOTE — Telephone Encounter (Signed)
Spoke with the pt  She is asking for [employer] form to be resubmitted with new date bc hers has expired  I advised will need a new form  Reminded her to sign her ROI page that will be included She will drop off next wk  Nothing further needed

## 2019-10-17 ENCOUNTER — Other Ambulatory Visit (HOSPITAL_COMMUNITY): Payer: Self-pay | Admitting: Internal Medicine

## 2019-10-17 DIAGNOSIS — I509 Heart failure, unspecified: Secondary | ICD-10-CM

## 2019-10-20 ENCOUNTER — Telehealth: Payer: Self-pay | Admitting: Internal Medicine

## 2019-10-20 ENCOUNTER — Other Ambulatory Visit (HOSPITAL_COMMUNITY): Payer: Self-pay | Admitting: Internal Medicine

## 2019-10-20 DIAGNOSIS — I509 Heart failure, unspecified: Secondary | ICD-10-CM

## 2019-10-20 NOTE — Telephone Encounter (Signed)
I don't have anything for Dr. Melvyn Novas or this patient

## 2019-10-20 NOTE — Telephone Encounter (Signed)
Drianna do you have this paperwork? I looked in [facility] stack but I didn't see anything. Please advise,

## 2019-10-21 NOTE — Telephone Encounter (Signed)
Called and left message at [employer], requesting that fax for recertification of oxygen be refaxed to our office. Waiting for fax to arrive.

## 2019-10-21 NOTE — Telephone Encounter (Signed)
Dr. Gustavus Bryant note from 06/11/19 states 02: uses 2lpm hs/ does fine with walking at 93% RA I don't know who ordered the 02 and our chart only states 02 at night

## 2019-10-22 NOTE — Telephone Encounter (Signed)
Called [employer] to request that the fax be resent. Waiting for fax.

## 2019-10-22 NOTE — Telephone Encounter (Signed)
Called and left message for Evelyn Hughes at [place], [phone] x [phone]. Requesting further information regarding patient's oxygen.

## 2019-10-27 NOTE — Telephone Encounter (Signed)
Checked MW's folders, did not see anything for this patient.   Will need to call Ann back in the morning as it after 5pm.

## 2019-10-27 NOTE — Telephone Encounter (Signed)
Ann alling to check the status of the o2 recerifications form that was faxed. Evelyn Hughes can be reached back at [phone] ext. [place]

## 2019-10-27 NOTE — Telephone Encounter (Signed)
Can someone please check and see if this was received? Thanks

## 2019-10-28 NOTE — Telephone Encounter (Signed)
Spoke with Evelyn Hughes  I gave her Annabel's fax [phone]  She will refax  Closing encounter

## 2019-10-29 NOTE — Telephone Encounter (Signed)
Form given to Dr Melvyn Novas to sign and will be faxed today- pt only using o2 at night and would need ov to qualify for daytime o2

## 2019-11-15 ENCOUNTER — Other Ambulatory Visit (HOSPITAL_COMMUNITY): Payer: Self-pay | Admitting: Internal Medicine

## 2019-11-15 ENCOUNTER — Other Ambulatory Visit: Payer: Self-pay | Admitting: Internal Medicine

## 2019-11-15 DIAGNOSIS — I509 Heart failure, unspecified: Secondary | ICD-10-CM

## 2019-11-15 DIAGNOSIS — Z9981 Dependence on supplemental oxygen: Secondary | ICD-10-CM

## 2020-01-07 ENCOUNTER — Other Ambulatory Visit: Payer: Self-pay

## 2020-01-07 ENCOUNTER — Encounter: Payer: Self-pay | Admitting: Internal Medicine

## 2020-01-07 ENCOUNTER — Ambulatory Visit: Payer: BC Managed Care – PPO | Admitting: Internal Medicine

## 2020-01-07 DIAGNOSIS — J9611 Chronic respiratory failure with hypoxia: Secondary | ICD-10-CM | POA: Insufficient documentation

## 2020-01-07 DIAGNOSIS — J449 Chronic obstructive pulmonary disease, unspecified: Secondary | ICD-10-CM | POA: Diagnosis not present

## 2020-01-07 MED ORDER — STIOLTO RESPIMAT 2.5-2.5 MCG/ACT IN AERS: 2.0000 | INHALATION_SPRAY | Freq: Every day | RESPIRATORY_TRACT | 0 refills | Status: DC

## 2020-01-07 NOTE — Assessment & Plan Note (Addendum)
Quit smoking 09/2018 p presenting with acute hypercarbic/hypoxemic resp failure / acute cor pulmonale that improved  prior to initial pulmonary office visit 11/05/2018 as did hypercarbia  - PFT's  03/08/2019  FEV1 1.06 (39 % ) ratio 0.48  p 5 % improvement from saba p nothing prior to study with DLCO  12.34 (57%) corrects to 3.36 (81%)  for alv volume and FV curve classic concave curvature on exp loop  - 01/07/2020  After extensive coaching inhaler device,  effectiveness =    75% with SMI > try stiolto as mouth/throat pain on advair  - alpha one AT screen 01/07/2020 >>>       Most likely it's the ICS that's causing mouth irritation and Pt is Group B in terms of symptom/risk and laba/lama therefore appropriate rx at this point >>>  Lama/laba probably best choice anyway > try stiolto 2 pff each am ("like high octane cduel" and if doing better can do just one pff if expensive or bothering her mouth  / throat.   Advised:  formulary restrictions will be an ongoing challenge for the forseable future and I would be happy to pick an alternative if the pt will first  provide me a list of them -  pt  will need to return here for training for any new device that is required eg dpi vs hfa vs respimat.    In the meantime we can always provide samples so that the patient never runs out of any needed respiratory medications.

## 2020-01-07 NOTE — Patient Instructions (Addendum)
Plan A = Automatic = Always=    Stiolto 2 puffs each am then rinse and gargle (stop advair and spiriva)  Work on inhaler technique:  relax and gently blow all the way out then take a nice smooth deep breath back in, triggering the inhaler at same time you start breathing in.  Hold for up to 5 seconds if you can.   Rinse and gargle with water when done     Plan B = Backup (to supplement plan A, not to replace it) Only use your albuterol inhaler as a rescue medication to be used if you can't catch your breath by resting or doing a relaxed purse lip breathing pattern.  - The less you use it, the better it will work when you need it. - Ok to use the inhaler up to 2 puffs  every 4 hours if you must but call for appointment if use goes up over your usual need - Don't leave home without it !!  (think of it like the spare tire for your car)    Please remember to go to the lab department   for your tests - we will call you with the results when they are available.  If your insurance requires an overnight oximetry then we can order it.   Please schedule a follow up visit in  6  months but call sooner if needed

## 2020-01-07 NOTE — Progress Notes (Signed)
Evelyn Hughes, female    DOB: 05-03-1956    MRN: [id number]   Brief patient profile:  10 yowf quit smoking 09/2018 with freq cough dx as bronchitis growing up in house of smokers has used inhalers as needed short term only with good activity tolerance then new leg swelling April 2020 eventually admitted x 2    Admit date: 09/27/2018 Discharge date: 10/06/2018    Discharge Diagnoses:      Active Hospital Problems   Diagnosis Date Noted  . Pressure injury of skin 09/28/2018  . Respiratory failure with hypoxia and hypercapnia ([place]) 09/27/2018           Vitals:   10/06/18 0847 10/06/18 0848  BP:    Pulse:    Resp:    Temp:    SpO2: 98% 98%    History of present illness:  The patient is an ill-appearing 64 year old female, current every day smoker recently admitted to the hospital ( 5/4-5/8) and ultimately diagnosed with a combination of what was likely cor pulmonale and acute hypoxic respiratory distress and failure requiring intubation. She self extubated and was ultimately placed on nasal cannula and discharged home with oxygen. Per the husband's report and per EMS report the patient has had a gradual decline especially over the last 12 to 24 hours refusing to eat or drink with AMS. Husband called 7, Paramedics found the patient to be hypoxic, Increased her oxygen and transported her to the ED. She was intubated within 30 minutes of arrival. PCCM admitted to ICU and managed care.  [facility] assumed care on 09/30/2018.  Admission 5/4-5/8 with intubation for respiratory failure Re-admission 5/10 for Acute on chronic Respiratory Failure Extubated on 09/29/18  5/4 CT abd/ pelvis >> 1. No acute intra-abdominal process. 2. Small ascites and mild anasarca.  5/4 CT chest >> 1. Moderate right and small left pleural effusions. No pneumonia. 2. Mild right heart enlargement with dilated main pulmonary artery, suggestive of pulmonary arterial hypertension. 3.  Emphysema  4. Aortic atherosclerosis  5/4 CTH >> Minimal frontal and parietal lobe atrophy. Otherwise negative exam.  09/22/2018 Echo The left ventricle has normal systolic function, with an ejection fraction of 55-60%. The cavity size was normal. Left ventricular diastolic function could not be evaluated. The right ventricle has normal systolc function. The cavity was mildly enlarged. Right ventricular systolic pressure is mildly elevated with an estimated pressure of 46.6 mmHg. Right atrial size was mildly dilated. The mitral valve is grossly normal. The tricuspid valve was grossly normal. The aortic valve is tricuspid No stenosis of the aortic valve. Limited study; normal LV function; mild RAE and RVE; moderate RV dysfunction; trace TR; mild pulmonary hypertension.  5/5 Doppler Studies Right: There is no evidence of deep vein thrombosis in the lower extremity. No cystic structure found in the popliteal fossa. Left: There is no evidence of deep vein thrombosis in the lower extremity. No cystic structure found in the popliteal fossa. Micro Data:  5/10 Blood Cultures x 2 5/10 Tracheal Aspirate- GPCs 5/10 Urine Legionella 5/10 Urine Strep 5/10 Covid Negative  Antimicrobials:  Completed Cefepime  Completed IV Vanc   Started on po Azithromycin x 5 days on 10/06/18 for her COPD  10/06/18: Patient seen and examined at her bedside.  No acute events overnight.  RHC was done yesterday.  Patient will follow-up with cardiology outpatient.  She has no new complaints.  She denies chest pain or dyspnea.  Admits to intermittent nonproductive cough with no subjective fevers or  chills.  States she feels well.  Bilateral lower extremity edema improving.  TED hose in place.  She wants to go home.  Mild leukocytosis which is improving.  Procalcitonin obtained and is negative.  Started on p.o. azithromycin x5 days for its anti-inflammatory properties.   On the day of discharge, the  patient was hemodynamically stable.  She will need to follow-up with her primary care provider, cardiology, and pulmonology posthospitalization.    Hospital Course:  Active Problems:   Respiratory failure with hypoxia and hypercapnia (HCC)   Pressure injury of skin  Resolving acute hypoxic hypercarbic respiratory failure, multifactorial secondary to acute volume overload, bilateral pleural effusion, COPD exacerbation Intubated on 09/27/2018.  Extubated on 09/29/2018 Currently on 2 L of oxygen by nasal cannula and saturating 97% Completed 5 days of IV cefepime Independent reviewed chest x-ray done on 10/03/2018 which showed small bilateral pleural effusions and right lower lobe atelectasis Patient encouraged to use incentive spirometer. 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 and right ventricular failure Heart failure team is following Continue diuresis as recommended by heart failure team Continue TED hose per heart failure team V/Qshowed very low probability PFTs with DLCOpending Pulmonary rehab on discharge Net I&O -2.5 L since admission Continue cardiac medications On Lasix 20 mg daily and spironolactone 25 mg daily as recommended by cardiology Follow-up with cardiology outpatient  Bilateral lower extremity 2+ pitting edema, improving with TED hose Improving with TED hose and diuretics  Bilateral Doppler ultrasound negative for DVT No obvious discoloration or hyperpigmentation  Emphysema COPD exacerbation Counseled on the importance of tobacco use cessation Maintain O2 saturation greater than 90% Continue inhalers Started Z-Pak, azithromycin 250 mg daily x5 days  Leukocytosis in the setting of recent CAP and steroid use Leukocytosis improving with  WBC 16 K from 20 K Procalcitonin is negative Afebrile Continue p.o. azithromycin to 50 mg daily x5 days  Resolved hypovolemic hyponatremia   Resolved hypomagnesemia post repletion  Tobacco use disorder Counseled on tobacco cessation at bedside Continue nicotine patch        History of Present Illness  11/05/2018  Pulmonary/ 1st office eval/[facility] Glymph  Chief Complaint  Patient presents with  . Pulmonary Consult    recent hospital admission for cor pulmonale and acute respiratory distress requiring intubation. She states that as far as she is aware she has never had any problems at all with her breathing and she denies any respiratory co's today.   Dyspnea:  Able to walk 13 min s 02 improving spiriva/wixella  Cough: none now Sleep: on 02 2lpm and able to sleep on side  Bed flat  SABA use: ventolin but not needing  rec 02 2lpm at bedtime and adjust during the day to keep it over 90% No change in your pulmonary medications   Please schedule a follow up office visit in 6 weeks, call sooner if needed with all medications   03/10/2019  f/u ov/Keron Koffman re:  GOLD III  Chief Complaint  Patient presents with  . Follow-up    PFT done 03/08/19. Pt states her breathing has been doing well. No co's today.   Dyspnea:  No regular sustained walking but able to cross parking lot and do a [employer] sats in mid 90s RA Cough: no Sleeping: no resp symptoms, 2 pillows SABA use: never  02: 2lpm hs  only  rec No change in medications or 02 Try to stay as active as you can and measure your 02 at peak exercise levels a few times a week   televisit 06/11/19 No change in medications for now but Judithann Sauger may be a better option for you when we see you  Stay as active as you can and get the vaccine for Covid 19  as soon as it's offered. Please schedule a follow up visit in 6 months but call sooner if needed with your drug formulary if available for a list of cheapest alternatives to your spiriva and  advair which tend to cause mouth and throat irritation Add: will need alpha one screening on return (I'll explain that, it's just a simple blood test)     pfizer vaccination for covid 19 2nd on 08/12/19    01/07/2020  f/u ov/Estie Sproule re: GOLD III  copd / advair/ spiriva bother  mouth  Chief Complaint  Patient presents with  . Follow-up    no complaints   Dyspnea:  Does fine at [employer] / walking outside unless it's hot  Cough: some throat clearing Sleeping: no resp problems @ hob on  2 pillows / flat bed  SABA use: none  02: 2lpm hs only    No obvious day to day or daytime variability or assoc excess/ purulent sputum or mucus plugs or hemoptysis or cp or chest tightness, subjective wheeze or overt sinus or hb symptoms.   Sleeping  without nocturnal  or early am exacerbation  of respiratory  c/o's or need for noct saba. Also denies any obvious fluctuation of symptoms with weather or environmental changes or other aggravating or alleviating factors except as outlined above   No unusual exposure hx or h/o childhood pna/ asthma or knowledge of premature birth.  Current Allergies, Complete Past Medical History, Past Surgical History, Family History, and Social History were reviewed in [employer] record.  ROS  The following are not active complaints unless bolded Hoarseness, sore mouth/ throat, dysphagia, dental problems, itching, sneezing,  nasal congestion or discharge of excess mucus or purulent secretions, ear ache,   fever, chills, sweats, unintended wt loss or wt gain, classically pleuritic or exertional cp,  orthopnea pnd or arm/hand swelling  or leg swelling, presyncope, palpitations, abdominal pain, anorexia, nausea, vomiting, diarrhea  or change in bowel habits or change in bladder habits, change in stools or change in urine, dysuria, hematuria,  rash, arthralgias, visual complaints, headache, numbness, weakness or ataxia or problems with walking or coordination,   change in mood or  memory.        Current Meds  Medication Sig  . albuterol (VENTOLIN HFA) 108 (90 Base) MCG/ACT inhaler Inhale 2 puffs into the lungs every 6 (six) hours as needed for wheezing or shortness of breath.  . Fluticasone-Salmeterol (ADVAIR DISKUS) 100-50 MCG/DOSE AEPB Inhale 1 puff into the lungs 2 (two) times daily.  . furosemide (LASIX) 20 MG tablet TAKE 1 TABLET BY MOUTH EVERY OTHER DAY  . magic mouthwash SOLN Take 5 mLs by mouth 3 (three) times daily.  . OXYGEN 1-2 lpm oxygen depending on level of activity  . SPIRIVA HANDIHALER 18 MCG inhalation capsule PLACE 1 CAPSULE INTO INHALER AND INHALE DAILY  . spironolactone (ALDACTONE) 25 MG tablet Take 1 tablet (25 mg total) by mouth daily. Needs appt for further refills                    Objective:  01/07/2020        105  03/10/19 113 lb (51.3 kg)  02/10/19 111 lb 6 oz (50.5 kg)  11/18/18 100 lb 3.2 oz (45.5 kg)    amb thin wf nad   Vital signs reviewed  01/07/2020  - Note at rest 02 sats  97% on RA       HEENT : pt wearing mask not removed for exam due to covid -19 concerns.    NECK :  without JVD/Nodes/TM/ nl carotid upstrokes bilaterally   LUNGS: no acc muscle use,  Mod barrel  contour chest wall with bilateral  Distant bs s audible wheeze and  without cough on insp or exp maneuvers and mod  Hyperresonant  to  percussion bilaterally     CV:  RRR  no s3 or murmur or increase in P2, and no edema   ABD:  soft and nontender with pos mid insp Hoover's  in the supine position. No bruits or organomegaly appreciated, bowel sounds nl  MS:     ext warm without deformities, calf tenderness, cyanosis or clubbing No obvious joint restrictions   SKIN: warm and dry without lesions    NEURO:  alert, approp, nl sensorium with  no motor or cerebellar deficits apparent.           Labs ordered 01/07/2020  :     alpha one AT phenotype        Assessment

## 2020-01-07 NOTE — Assessment & Plan Note (Addendum)
1st placed on 02 09/2018 - HC03  10/21/2018   27  -  01/07/2020   Walked RA  2 laps @ approx 227ft each @ fast pace  stopped due to end of study, no sob sats 96%   Not likely hypercarbic at this point / no indication for amb 02 and may need ono on RA to qualify for noct 02 if req by insurance.          Each maintenance medication was reviewed in detail including emphasizing most importantly the difference between maintenance and prns and under what circumstances the prns are to be triggered using an action plan format where appropriate.  Total time for H and P, chart review, counseling, teaching [facility] using teachback  /  directly observing portions of ambulatory 02 saturation study/  .and generating customized AVS unique to this office visit / charting = 40 min

## 2020-01-09 ENCOUNTER — Encounter: Payer: Self-pay | Admitting: Internal Medicine

## 2020-01-15 LAB — ALPHA-1 ANTITRYPSIN PHENOTYPE: A-1 Antitrypsin, Ser: 181 mg/dL (ref 83–199)

## 2020-01-17 NOTE — Progress Notes (Signed)
Left detailed msg on machine ok per DPR

## 2020-01-20 ENCOUNTER — Telehealth: Payer: Self-pay | Admitting: Internal Medicine

## 2020-01-21 MED ORDER — STIOLTO RESPIMAT 2.5-2.5 MCG/ACT IN AERS: 2.0000 | INHALATION_SPRAY | Freq: Every day | RESPIRATORY_TRACT | 11 refills | Status: DC

## 2020-01-21 NOTE — Telephone Encounter (Signed)
Patient is returning phone call. Patient phone number is [phone].

## 2020-01-21 NOTE — Telephone Encounter (Signed)
Evelyn Hughes, [place]  01/17/2020 11:47 AM EDT     Left detailed msg on machine ok per DPR    Tanda Rockers, MD  01/16/2020 6:10 AM EDT     Call patient : Study is unremarkable, no change    I called and spoke with the pt and notified her of the lab results  She verbalized understanding  I have sent refill on her Stiolto

## 2020-01-25 ENCOUNTER — Other Ambulatory Visit (HOSPITAL_COMMUNITY): Payer: Self-pay | Admitting: Internal Medicine

## 2020-01-25 DIAGNOSIS — I509 Heart failure, unspecified: Secondary | ICD-10-CM

## 2020-01-26 ENCOUNTER — Telehealth: Payer: Self-pay | Admitting: Internal Medicine

## 2020-01-26 MED ORDER — STIOLTO RESPIMAT 2.5-2.5 MCG/ACT IN AERS: 2.0000 | INHALATION_SPRAY | Freq: Every day | RESPIRATORY_TRACT | 11 refills | Status: DC

## 2020-01-26 NOTE — Telephone Encounter (Signed)
Rx for Stiolto has been resent to preferred pharmacy for pt.  Attempted to call pt but unable to reach. Left message for her to return call.

## 2020-01-27 NOTE — Telephone Encounter (Signed)
Called and spoke to pt. Pt states she was notified by the pharmacy that her script is ready and she will be picking it up shortly. Nothing further needed at this time. Will sign off.

## 2020-02-03 NOTE — Progress Notes (Signed)
Advanced Heart Failure Clinic Note   Date:  02/03/2020   ID:  Evelyn Hughes, DOB December 27, 1955, MRN [id number]  Location: Home  Provider location: [place] Advanced Heart Failure Type of Visit: Established patient   PCP:  Evelyn Blackbird, MD  Cardiologist:  No primary care provider on file. Primary HF: Dr Evelyn Hughes   Chief Complaint: Heart Failure   History of Present Illness: Evelyn Hughes is a 64 y.o. female with a history of tobacco abuse, COPD, pulmonary HTN, and chronic respiratory failure.   Admitted to [facility] 09/27/2018 with AMS found to be hypoxic on arrival requiring intubation. She was later extubated on 09/29/2018. Placed on IV lasix due to leg edema. Once diuresed, RHC was performed. RHC showed minimally elevated PA pressures and mildly elevated cardiac output. She was discharged on lasix 20 mg daily. Discharge weight was 111 pounds.   She presents for routine f/u. Here with her husband Evelyn Hughes. Continues to feel great. Taking lasix every other day. Watching salt closely. Use pulse ox and sats > 90% even with walking. Wear O2 at night. Saw Dr. Melvyn Hughes last month and inhalers changed. Goes on short walks. No edema, orthopnea or PND.     PFTs FEV1 1.01 (37%) FVC   2.17 (62% FEF 25-75 0.36 DLCO 57%  RHC 10/05/18  RA = 1 ( v waves to 10 due to TR) RV = 32/4 PA = 35/11 (24) PCW = 10 Fick cardiac output/index = 6.42/4.15 PVR = 2.2 WU Ao sat = 99% PA sat =79%, 80% SVC = 79%  ECHO 09/22/2018 LVEF 55-60%, RV normal  Past Medical History:  Diagnosis Date   Allergy    Past Surgical History:  Procedure Laterality Date   RIGHT HEART CATH N/A 10/05/2018   Procedure: RIGHT HEART CATH;  Surgeon: Jolaine Artist, MD;  Location: [facility];  Service: Cardiovascular;  Laterality: N/A;     Current Outpatient Medications  Medication Sig Dispense Refill   albuterol (VENTOLIN HFA) 108 (90 Base) MCG/ACT inhaler Inhale 2 puffs into the lungs every 6 (six) hours as  needed for wheezing or shortness of breath. 1 Inhaler 2   furosemide (LASIX) 20 MG tablet TAKE 1 TABLET BY MOUTH EVERY OTHER DAY 30 tablet 2   magic mouthwash SOLN Take 5 mLs by mouth 3 (three) times daily. 250 mL 0   OXYGEN 1-2 lpm oxygen depending on level of activity     spironolactone (ALDACTONE) 25 MG tablet TAKE 1 TABLET BY MOUTH ONCE DAILY **NEEDS  APPOINTMENT  FOR  FURTHER  REFILLS** 90 tablet 0   Tiotropium Bromide-Olodaterol (STIOLTO RESPIMAT) 2.5-2.5 MCG/ACT AERS Inhale 2 puffs into the lungs daily. 4 g 11   No current facility-administered medications for this encounter.    Allergies:   Expectorant cough control [guaifenesin], Sulfa antibiotics, and Penicillins   Social History:  The patient  reports that she quit smoking about 16 months ago. Her smoking use included cigarettes. She has a 96.00 pack-year smoking history. She has never used smokeless tobacco. She reports that she does not drink alcohol and does not use drugs.   Family History:  The patient's family history includes Cancer in her mother.   ROS:  Please see the history of present illness.   All other systems are personally reviewed and negative.   Exam:   General:  Thin. Well appearing. No resp difficulty HEENT: normal Neck: supple. no JVD. Carotids 2+ bilat; no bruits. No lymphadenopathy or thryomegaly appreciated.  Cor: PMI nondisplaced. Regular rate & rhythm. No rubs, gallops or murmurs. Lungs: clear but markedly decreased throughout. No wheeze Abdomen: soft, nontender, nondistended. No hepatosplenomegaly. No bruits or masses. Good bowel sounds. Extremities: no cyanosis, clubbing, rash, edema Neuro: alert & orientedx3, cranial nerves grossly intact. moves all 4 extremities w/o difficulty. Affect pleasant  Recent Labs: 02/10/2019: B Natriuretic Peptide 41.7; BUN 15; Creatinine, Ser 0.72; Potassium 4.7; Sodium 138  Personally reviewed   Wt Readings from Last 3 Encounters:  01/07/20 48 kg (105 lb 12.8 oz)   03/10/19 51.3 kg (113 lb)  02/10/19 50.5 kg (111 lb 6 oz)      ASSESSMENT AND PLAN:  1. 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 LE u/s negative for DVT - Much improved with diuresis and O2. Only using O2 at night - NYHA II - 10/03/2018 VQnegative.  - RHC  Minimally elevated PA pressures and PVR 2.2.  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 Chronic respiratory failure due to advanced COPD - Wears O2 at night and as needed - Uses pulse ox to ensure sat > 90% - Follows with Dr. Melvyn Hughes  3.Chronic diastolic HF - NYHA II. Volume status stable. Continue lasix every other day.   - Continue 25 mg spironolactone daily.  - Check labs today  4. 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

## 2020-02-04 ENCOUNTER — Other Ambulatory Visit: Payer: Self-pay

## 2020-02-04 ENCOUNTER — Ambulatory Visit (HOSPITAL_COMMUNITY)
Admission: RE | Admit: 2020-02-04 | Discharge: 2020-02-04 | Disposition: A | Discharge status: Home / Self Care | Payer: BC Managed Care – PPO | Source: Ambulatory Visit | Attending: Internal Medicine | Admitting: Internal Medicine

## 2020-02-04 ENCOUNTER — Encounter (HOSPITAL_COMMUNITY): Payer: Self-pay | Admitting: Internal Medicine

## 2020-02-04 VITALS — BP 98/60 | HR 91 | Ht 65.0 in | Wt 105.0 lb

## 2020-02-04 DIAGNOSIS — I272 Pulmonary hypertension, unspecified: Secondary | ICD-10-CM | POA: Diagnosis not present

## 2020-02-04 DIAGNOSIS — J9611 Chronic respiratory failure with hypoxia: Secondary | ICD-10-CM

## 2020-02-04 DIAGNOSIS — I5022 Chronic systolic (congestive) heart failure: Secondary | ICD-10-CM

## 2020-02-04 DIAGNOSIS — Z79899 Other long term (current) drug therapy: Secondary | ICD-10-CM | POA: Diagnosis not present

## 2020-02-04 DIAGNOSIS — J9610 Chronic respiratory failure, unspecified whether with hypoxia or hypercapnia: Secondary | ICD-10-CM | POA: Insufficient documentation

## 2020-02-04 DIAGNOSIS — I2721 Secondary pulmonary arterial hypertension: Secondary | ICD-10-CM

## 2020-02-04 DIAGNOSIS — Z87891 Personal history of nicotine dependence: Secondary | ICD-10-CM | POA: Diagnosis not present

## 2020-02-04 DIAGNOSIS — J449 Chronic obstructive pulmonary disease, unspecified: Secondary | ICD-10-CM | POA: Insufficient documentation

## 2020-02-04 DIAGNOSIS — I5032 Chronic diastolic (congestive) heart failure: Secondary | ICD-10-CM | POA: Diagnosis present

## 2020-02-04 LAB — CBC
HCT: 40.7 % (ref 36.0–46.0)
Hemoglobin: 13 g/dL (ref 12.0–15.0)
MCH: 29.5 pg (ref 26.0–34.0)
MCHC: 31.9 g/dL (ref 30.0–36.0)
MCV: 92.3 fL (ref 80.0–100.0)
Platelets: 436 10*3/uL — ABNORMAL HIGH (ref 150–400)
RBC: 4.41 MIL/uL (ref 3.87–5.11)
RDW: 12.5 % (ref 11.5–15.5)
WBC: 7.9 10*3/uL (ref 4.0–10.5)
nRBC: 0 % (ref 0.0–0.2)

## 2020-02-04 LAB — BRAIN NATRIURETIC PEPTIDE: B Natriuretic Peptide: 51.9 pg/mL (ref 0.0–100.0)

## 2020-02-04 NOTE — Patient Instructions (Signed)
Labs done today, your results will be available in MyChart, we will contact you for abnormal readings.  Your physician has requested that you have an echocardiogram. Echocardiography is a painless test that uses sound waves to create images of your heart. It provides your doctor with information about the size and shape of your heart and how well your heart's chambers and valves are working. This procedure takes approximately one hour. There are no restrictions for this procedure.   Please call our office in 1 year to schedule your follow up appointment  If you have any questions or concerns before your next appointment please send [place] a message through [place] or call our office at [phone].    TO LEAVE A MESSAGE FOR THE NURSE SELECT OPTION 2, PLEASE LEAVE A MESSAGE INCLUDING: . YOUR NAME . DATE OF BIRTH . CALL BACK NUMBER . REASON FOR CALL**this is important as we prioritize the call backs  [place] AS LONG AS YOU CALL BEFORE 4:00 PM  At the [facility], you and your health needs are our priority. As part of our continuing mission to provide you with exceptional heart care, we have created designated Provider Care Teams. These Care Teams include your primary Cardiologist (physician) and Advanced Practice Providers (APPs- Physician Assistants and Nurse Practitioners) who all work together to provide you with the care you need, when you need it.   You may see any of the following providers on your designated Care Team at your next follow up: Marland Kitchen Dr Glori Bickers . Dr Loralie Champagne . Darrick Grinder, NP . Lyda Jester, PA . Audry Riles, PharmD   Please be sure to bring in all your medications bottles to every appointment.

## 2020-02-14 ENCOUNTER — Other Ambulatory Visit: Payer: Self-pay

## 2020-02-14 ENCOUNTER — Ambulatory Visit (HOSPITAL_COMMUNITY)
Admission: RE | Admit: 2020-02-14 | Discharge: 2020-02-14 | Disposition: A | Discharge status: Home / Self Care | Payer: BC Managed Care – PPO | Source: Ambulatory Visit | Attending: Internal Medicine | Admitting: Internal Medicine

## 2020-02-14 DIAGNOSIS — I5022 Chronic systolic (congestive) heart failure: Secondary | ICD-10-CM | POA: Diagnosis present

## 2020-02-14 LAB — ECHOCARDIOGRAM COMPLETE
Area-P 1/2: 4.06 cm2
S' Lateral: 2.7 cm

## 2020-02-14 NOTE — Progress Notes (Signed)
  Echocardiogram 2D Echocardiogram has been performed.  Fidel Levy 02/14/2020, 12:12 PM

## 2020-02-22 ENCOUNTER — Telehealth (HOSPITAL_COMMUNITY): Payer: Self-pay

## 2020-02-22 NOTE — Telephone Encounter (Signed)
Malena Edman, RN  02/22/2020 1:00 PM EDT Back to Top    Patient advised and verbalized understanding   Malena Edman, RN  02/21/2020 4:43 PM EDT     Left message to return call

## 2020-02-22 NOTE — Telephone Encounter (Signed)
-----   Message from Jolaine Artist, MD sent at 02/19/2020  5:08 PM EDT ----- EF low normal with thinning of the basilar inferior wall. RV mildly dilated. Mild to moderate RV hypokinesis. Continue current therapy.

## 2020-03-18 ENCOUNTER — Ambulatory Visit: Payer: BC Managed Care – PPO | Attending: Internal Medicine

## 2020-03-18 DIAGNOSIS — Z23 Encounter for immunization: Secondary | ICD-10-CM

## 2020-03-18 NOTE — Progress Notes (Signed)
   Covid-19 Vaccination Clinic  Name:  Evelyn Hughes    MRN: [id number] DOB: 1955/08/23  03/18/2020  Evelyn Hughes was observed post Covid-19 immunization for 30 minutes based on pre-vaccination screening without incident. She was provided with Vaccine Information Sheet and instruction to access the V-Safe system.   Evelyn Hughes was instructed to call 911 with any severe reactions post vaccine: Marland Kitchen Difficulty breathing  . Swelling of face and throat  . A fast heartbeat  . A bad rash all over body  . Dizziness and weakness

## 2020-04-23 ENCOUNTER — Other Ambulatory Visit (HOSPITAL_COMMUNITY): Payer: Self-pay | Admitting: Internal Medicine

## 2020-04-23 DIAGNOSIS — I509 Heart failure, unspecified: Secondary | ICD-10-CM

## 2020-06-23 ENCOUNTER — Other Ambulatory Visit (HOSPITAL_COMMUNITY): Payer: Self-pay | Admitting: Internal Medicine

## 2020-06-23 DIAGNOSIS — I509 Heart failure, unspecified: Secondary | ICD-10-CM

## 2020-07-10 ENCOUNTER — Ambulatory Visit: Payer: BC Managed Care – PPO | Admitting: Internal Medicine

## 2020-07-10 IMAGING — CT CT ABDOMEN AND PELVIS WITHOUT CONTRAST
2 of 4 series · 13 of 46 positions shown, 15 images · non-contrast
Comparison: Chest x-ray from same day.

CLINICAL DATA: Shortness of breath and bilateral leg swelling.

EXAM:
CT CHEST, ABDOMEN AND PELVIS WITHOUT CONTRAST
TECHNIQUE: Multidetector CT imaging of the chest, abdomen and pelvis was
performed following the standard protocol without IV contrast.

[Series 2: axial st · axial · 0.64mm/px · z∈[-625,-70]mm · 10 of 133 slices shown, 12 images]
[im 11/133  soft-tissue]
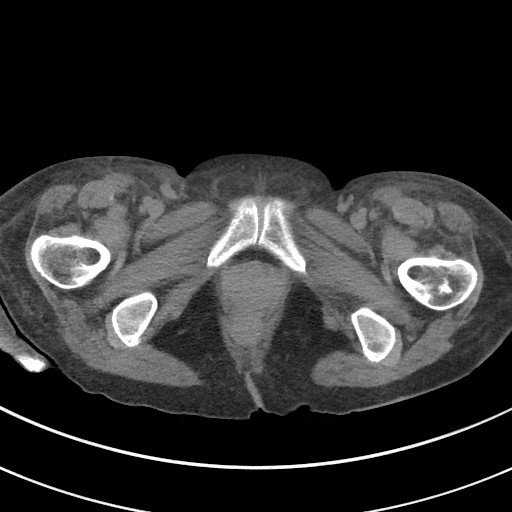
[im 11/133  bone]
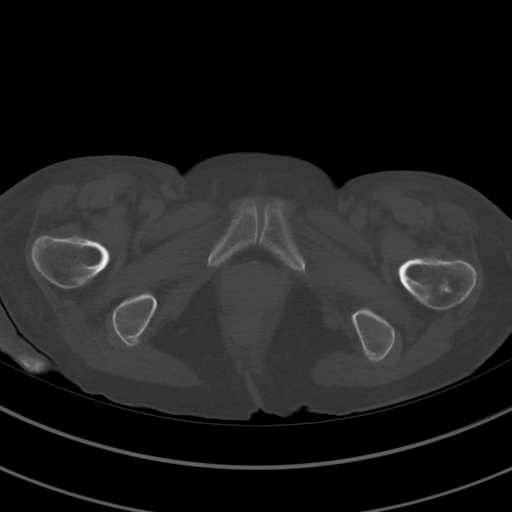
[im 21/133  soft-tissue]
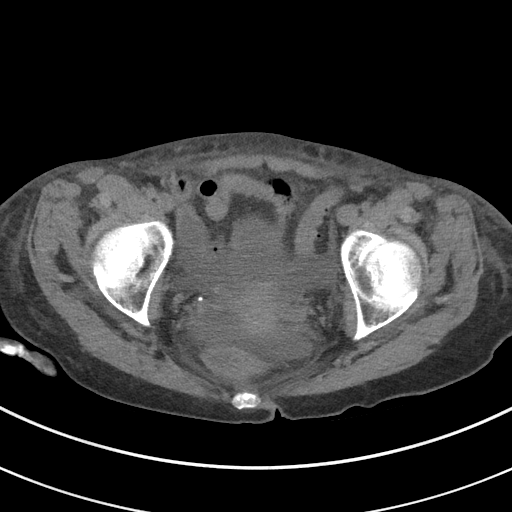
[im 41/133  soft-tissue]
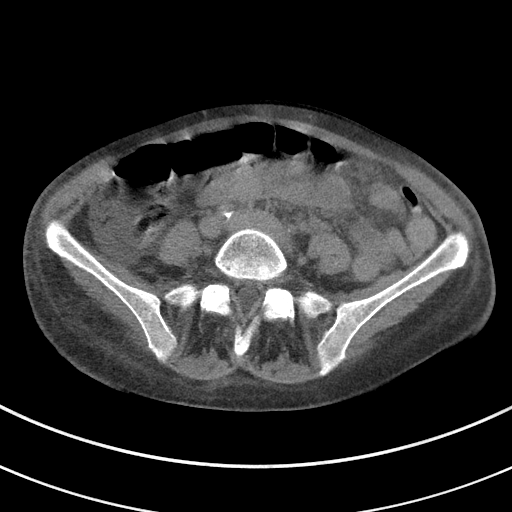
[im 51/133  soft-tissue]
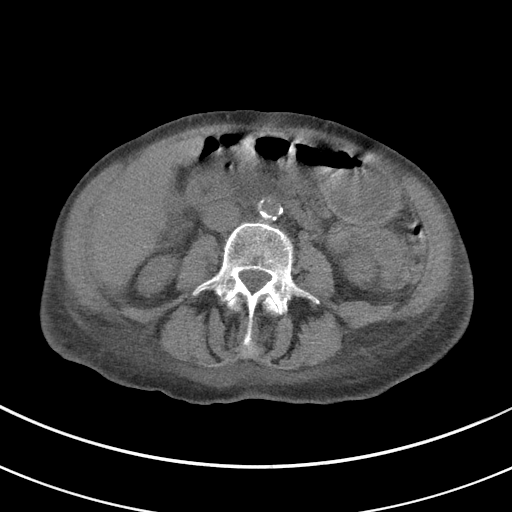
[im 61/133  soft-tissue]
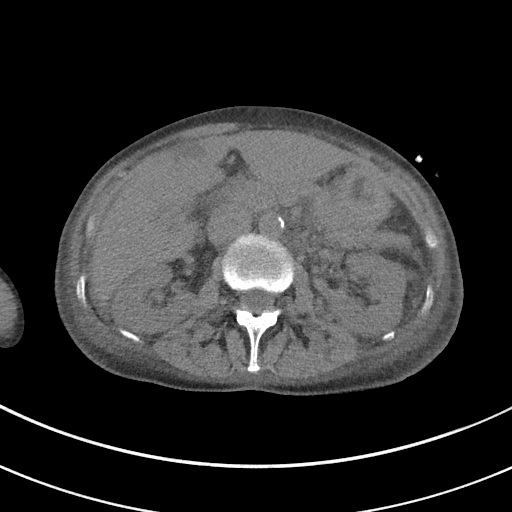
[im 72/133  soft-tissue]
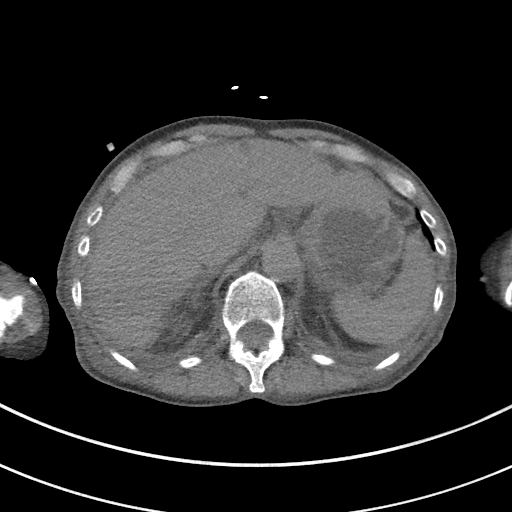
[im 82/133  soft-tissue]
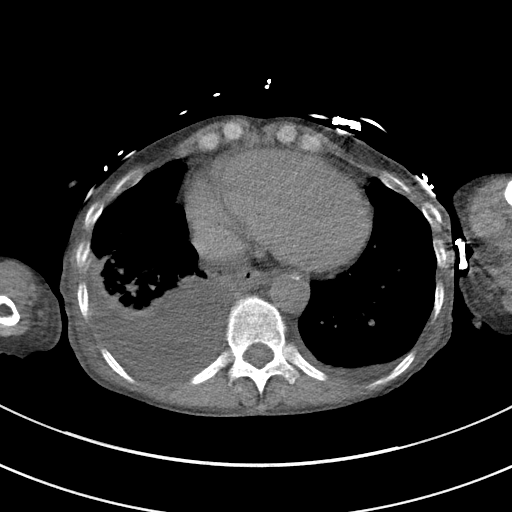
[im 102/133  soft-tissue]
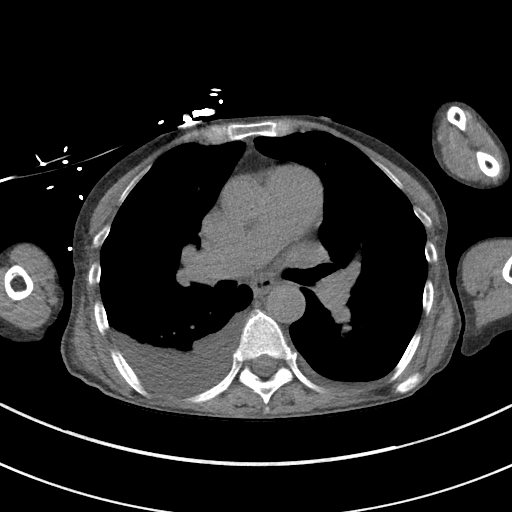
[im 112/133  soft-tissue]
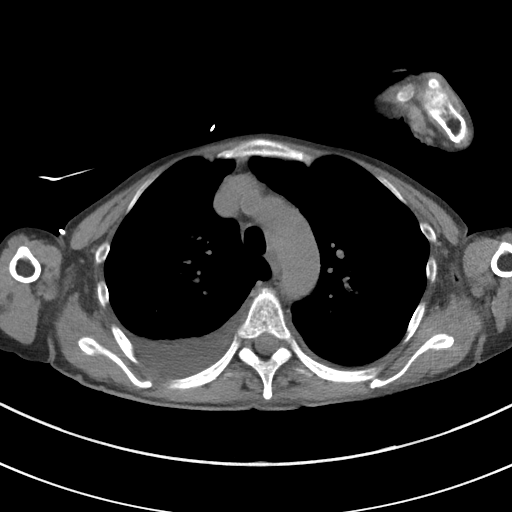
[im 112/133  bone]
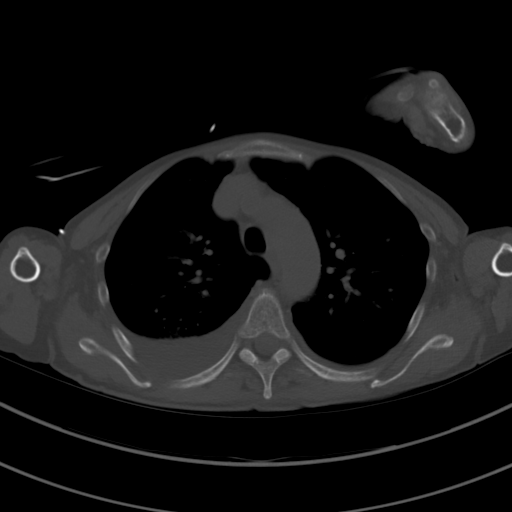
[im 122/133  soft-tissue]
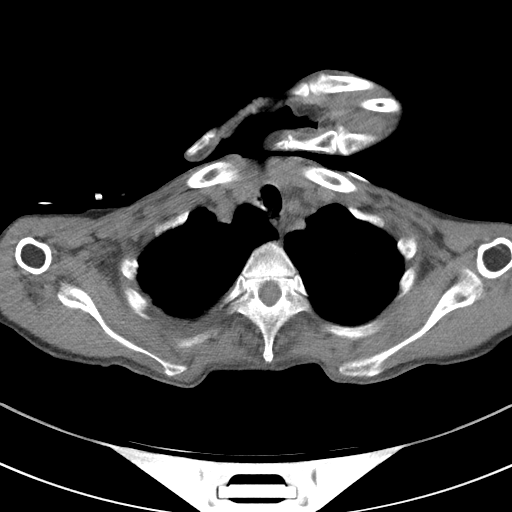

[Series 5: coronal · coronal · 0.66mm/px · 3 of 109 slices shown]
[im 37/109  soft-tissue]
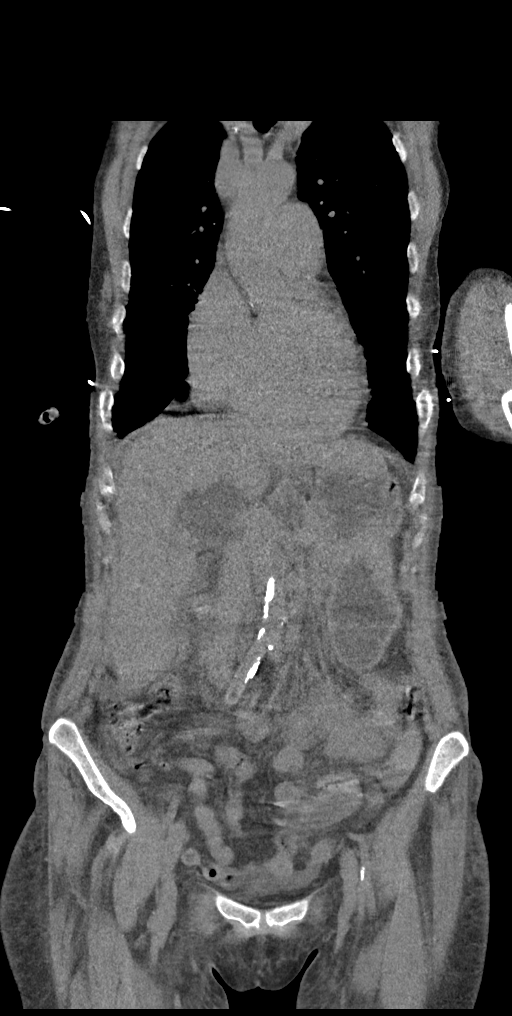
[im 49/109  soft-tissue]
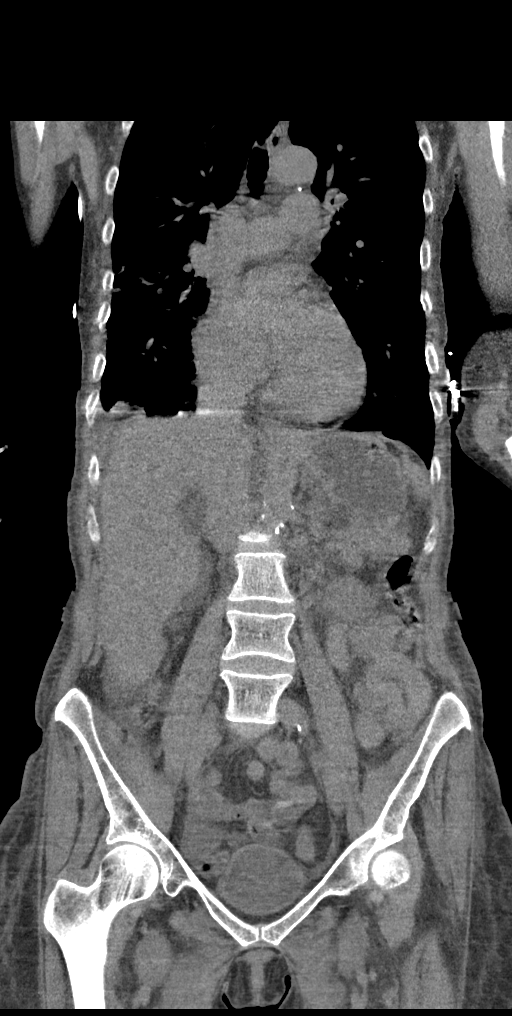
[im 61/109  soft-tissue]
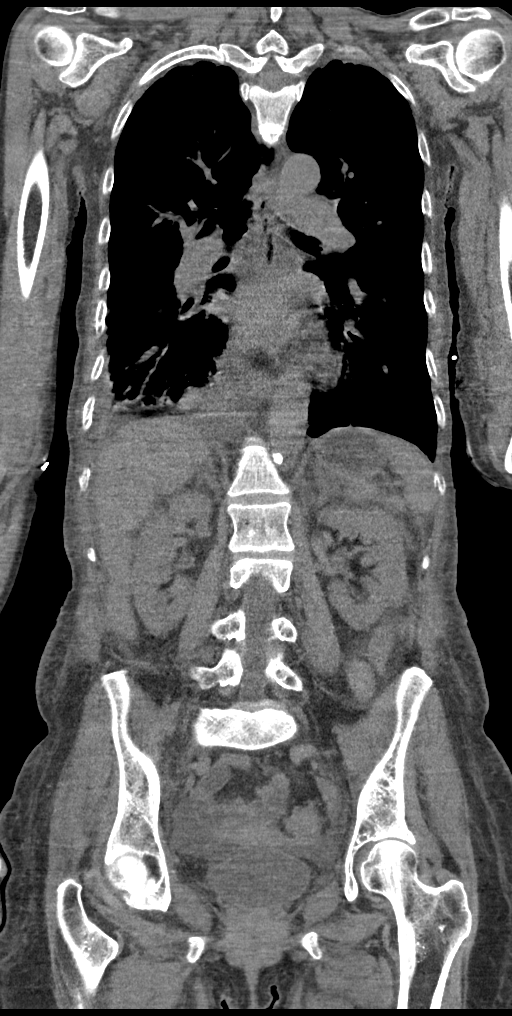

[13 of 46 positions shown; findings below may reference images not displayed]

FINDINGS: CT CHEST FINDINGS

Cardiovascular: Mild right heart enlargement. No pericardial
effusion. No thoracic aortic aneurysm. Coronary, aortic arch, and
branch vessel atherosclerotic vascular disease. Dilated main
pulmonary artery measuring up to 3.5 cm.

Mediastinum/Nodes: No enlarged mediastinal, hilar, or axillary lymph
nodes. Thyroid gland, trachea, and esophagus demonstrate no
significant findings.

Lungs/Pleura: Upper lobe predominant centrilobular and paraseptal
emphysema. Moderate right and small left pleural effusions. Linear
scarring/atelectasis in the right middle lobe. Right greater than
left lower lobe atelectasis. No consolidation or pneumothorax. No
suspicious pulmonary nodule.

Musculoskeletal: No chest wall mass or suspicious bone lesions
identified.

CT ABDOMEN PELVIS FINDINGS

Hepatobiliary: Scattered hepatic simple cysts measuring up to
cm. The gallbladder is contracted. No gallstones, gallbladder wall
thickening, or biliary dilatation.

Pancreas: Unremarkable. No pancreatic ductal dilatation or
surrounding inflammatory changes.

Spleen: Normal in size without focal abnormality.

Adrenals/Urinary Tract: Adrenal glands are unremarkable. Kidneys are
normal, without renal calculi, focal lesion, or hydronephrosis.
Bladder is unremarkable.

Stomach/Bowel: Stomach is within normal limits. Appendix appears
normal. No evidence of bowel wall thickening, distention, or
inflammatory changes.

Vascular/Lymphatic: Aortic atherosclerosis. No enlarged abdominal or
pelvic lymph nodes.

Reproductive: Uterus and bilateral adnexa are unremarkable.

Other: Small ascites. No pneumoperitoneum. No abdominal wall hernia.

Musculoskeletal: Mild anasarca. No acute or significant osseous
findings.
IMPRESSION: Chest:

1. Moderate right and small left pleural effusions.  No pneumonia.
2. Mild right heart enlargement with dilated main pulmonary artery,
suggestive of pulmonary arterial hypertension.
3.  Emphysema (V8HC3-1OK.H).
4.  Aortic atherosclerosis (V8HC3-JOL.L).

Abdomen and pelvis:

1.  No acute intra-abdominal process.
2. Small ascites and mild anasarca.

## 2020-07-10 IMAGING — DX PORTABLE CHEST - 1 VIEW
1 series · 1 of 1 positions shown · non-contrast
Comparison: 09/21/2018

CLINICAL DATA: Endotracheal tube placement

EXAM:
PORTABLE CHEST 1 VIEW

[chest ap]
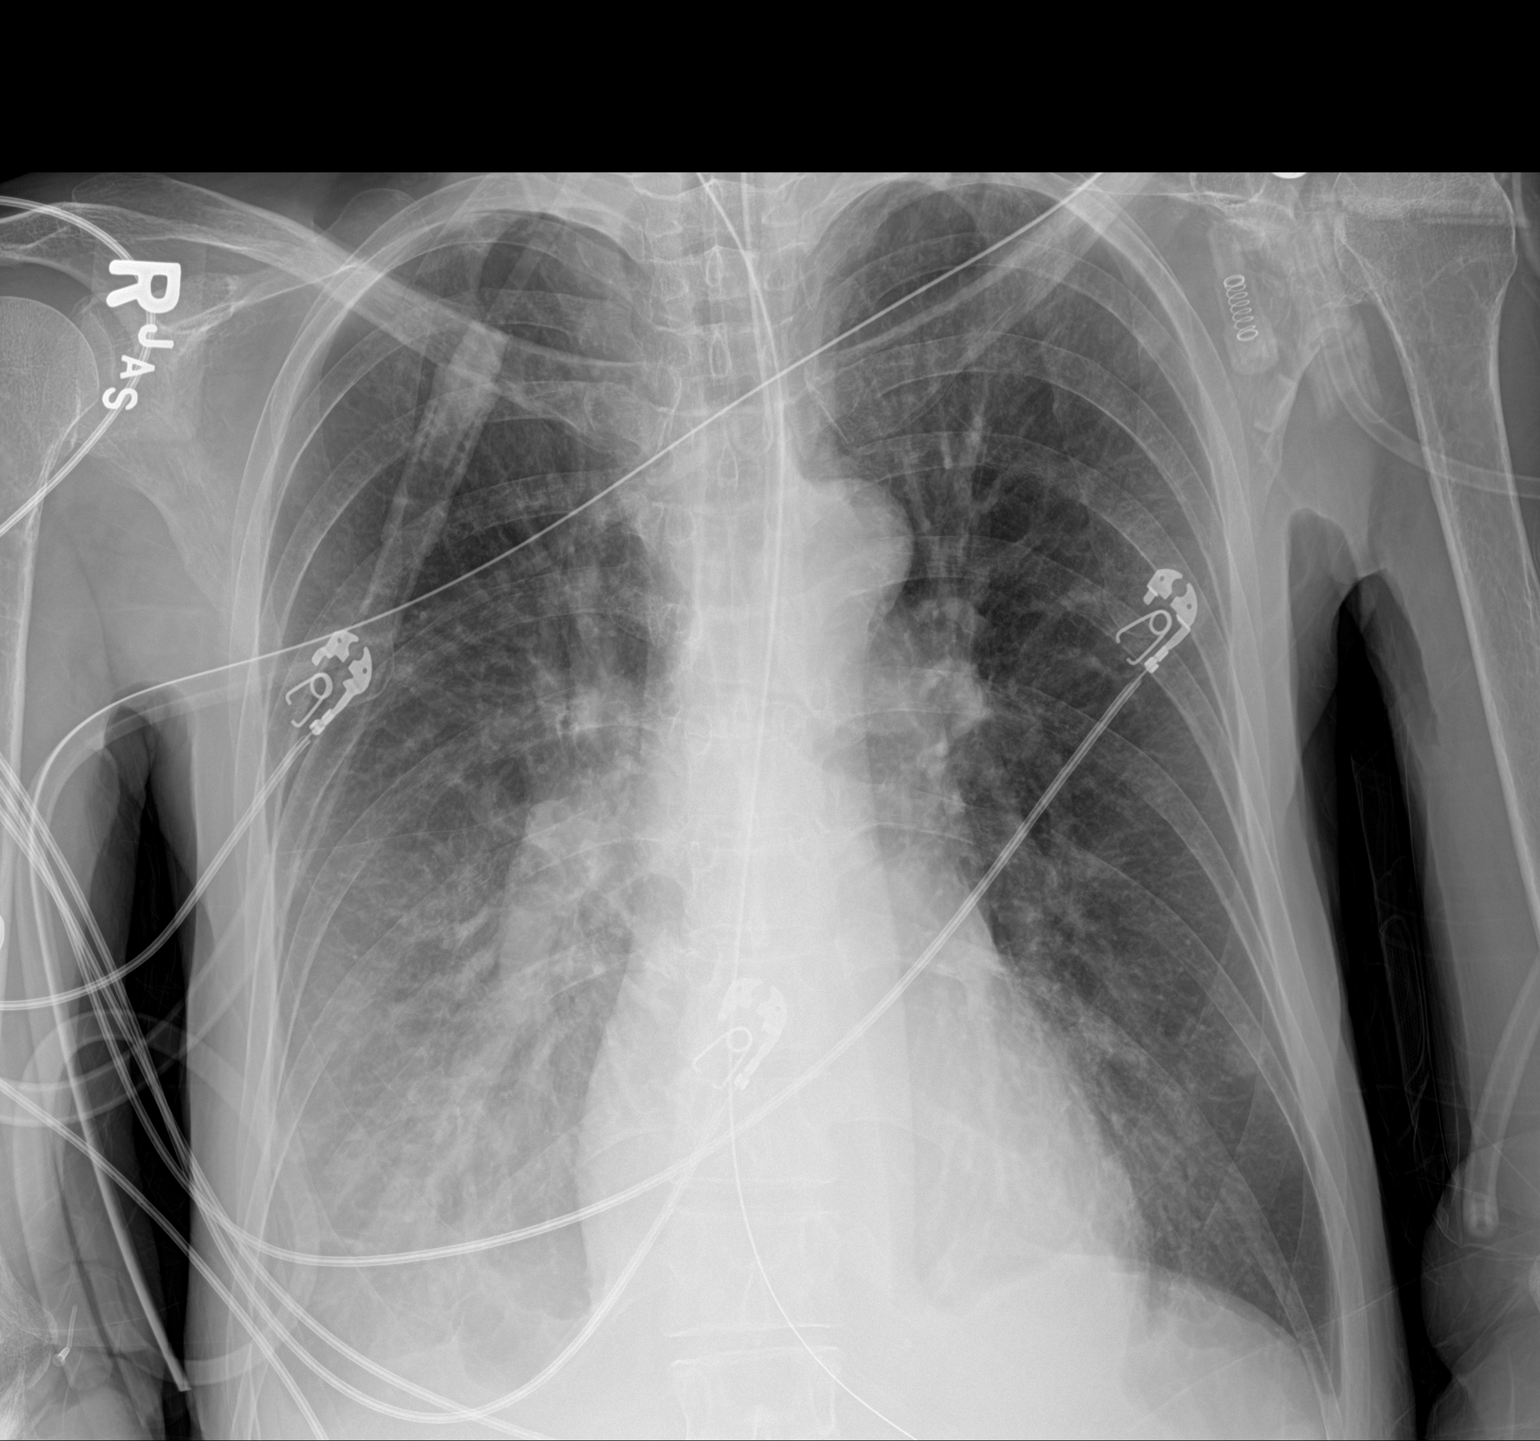

[1 of 1 positions shown; findings below may reference images not displayed]

FINDINGS: Endotracheal tube is 5.5 cm above the carina. Hyperinflation of the
lungs. Layering right pleural effusion with right lower lobe
airspace opacity. No focal opacity on the left. Heart is borderline
in size.
IMPRESSION: Endotracheal tube 5.5 cm above the carina.

Moderate layering right effusion with right lower lobe atelectasis
or infiltrate.

Hyperinflation.

## 2020-07-10 IMAGING — DX PORTABLE CHEST - 1 VIEW
1 series · 2 of 2 positions shown · non-contrast
Comparison: Portable exam 0494 hours compared to earlier study of
12 T4 hours

CLINICAL DATA: Shortness of breath, tube placement

EXAM:
PORTABLE CHEST 1 VIEW

[Series 1: chest ap · 0.14mm/px · 2 of 2 slices shown]
[im 1/2]
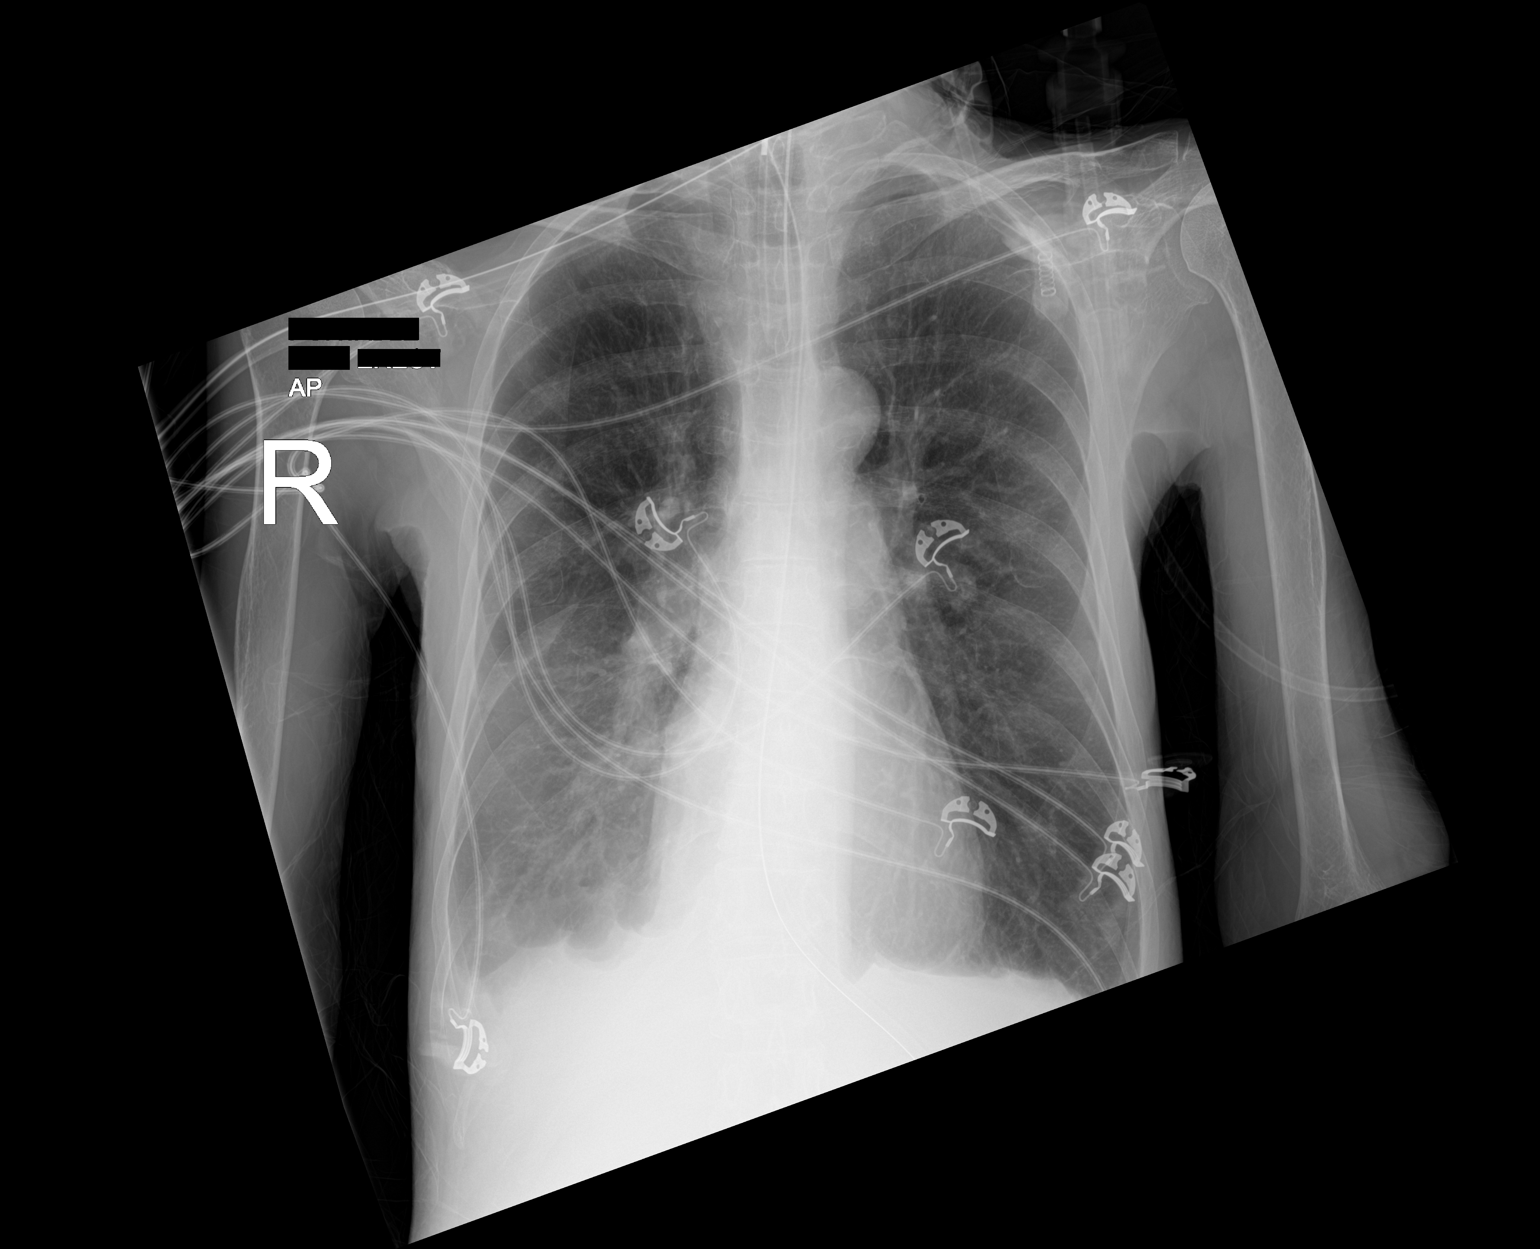
[im 2/2]
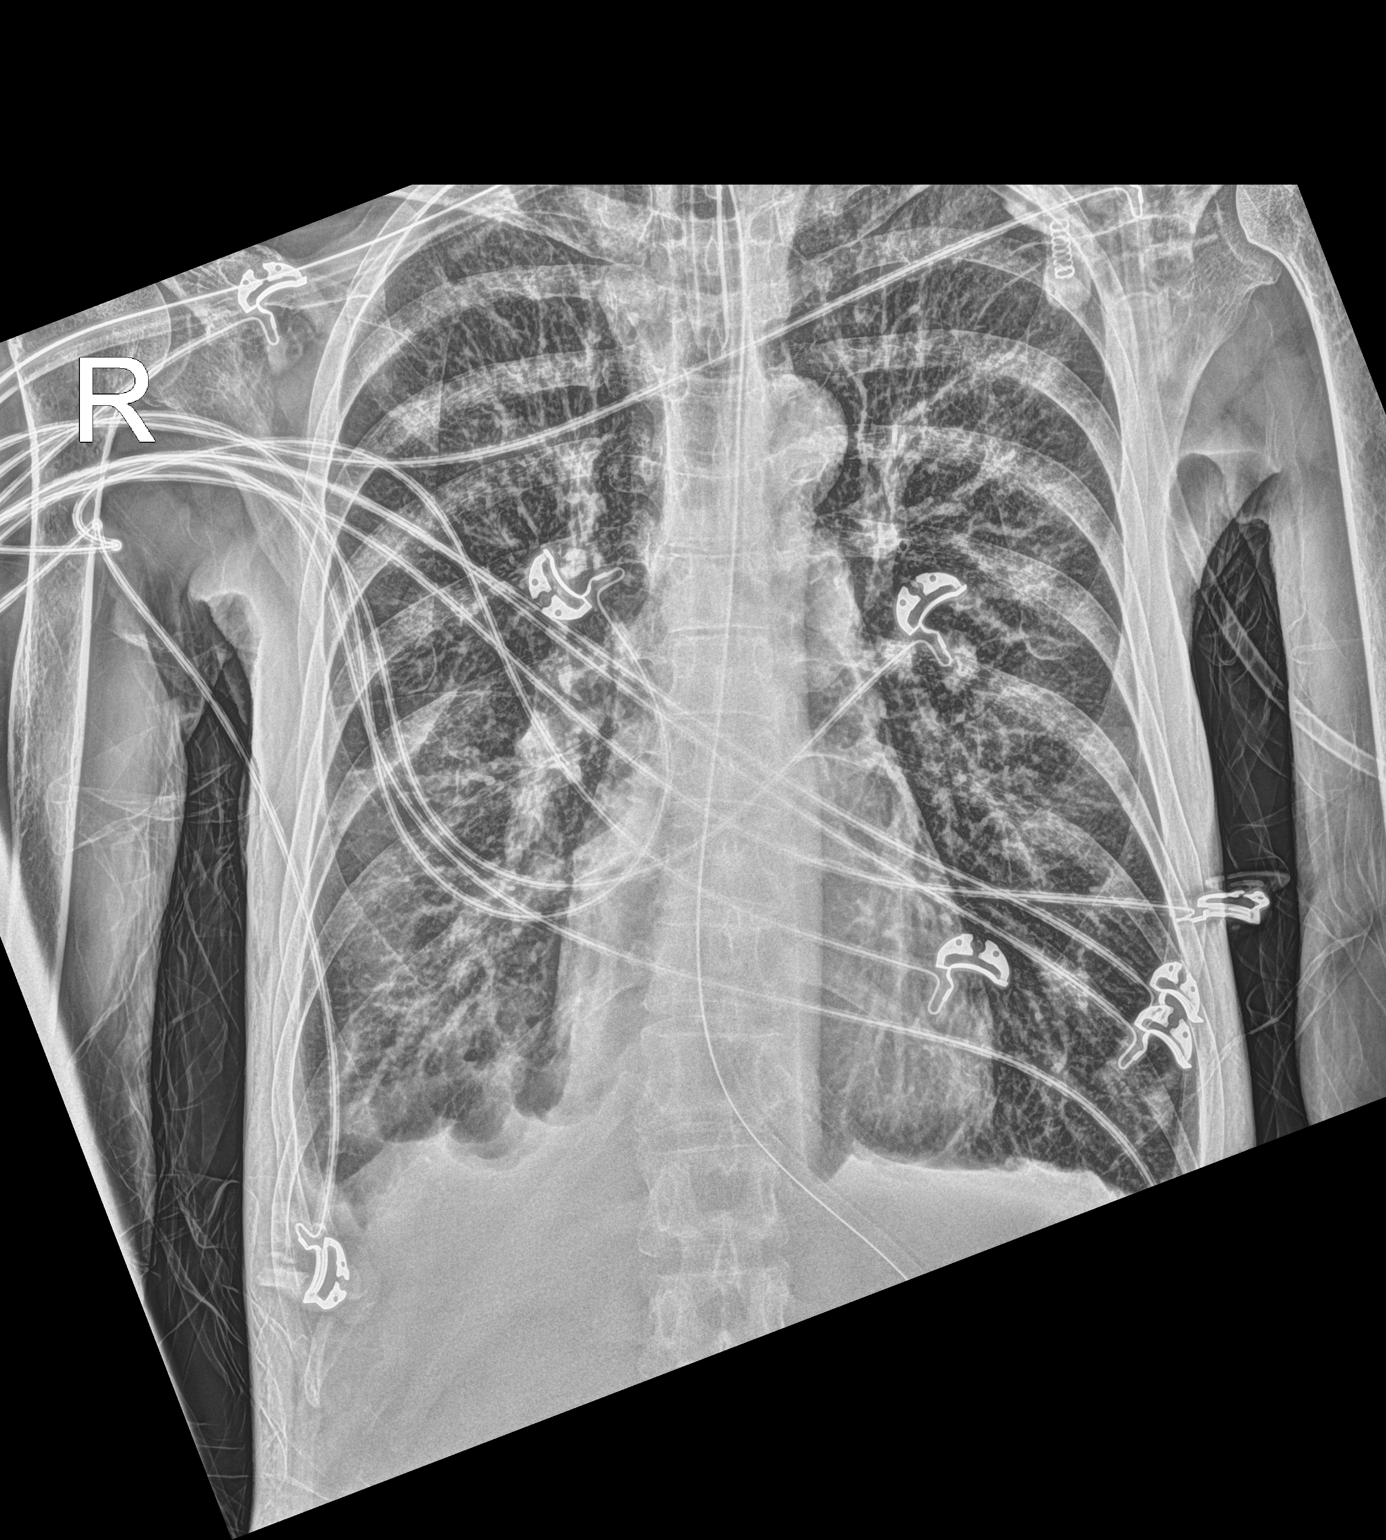

[2 of 2 positions shown; findings below may reference images not displayed]

FINDINGS: Tip of endotracheal tube projects 5.8 cm above carina.

Nasogastric tube extends into stomach.

Normal heart size, mediastinal contours, and pulmonary vascularity.

Atherosclerotic calcification aorta.

Emphysematous changes with decreased RIGHT pleural effusion and
basilar atelectasis versus previous study.

No new infiltrate or pneumothorax.

Bones demineralized.
IMPRESSION: Tube positions as above.

COPD changes with decreased RIGHT pleural effusion and basilar
atelectasis since earlier study.

## 2020-07-14 ENCOUNTER — Telehealth: Payer: Self-pay | Admitting: Internal Medicine

## 2020-07-17 ENCOUNTER — Encounter: Payer: Self-pay | Admitting: Internal Medicine

## 2020-07-17 ENCOUNTER — Ambulatory Visit (INDEPENDENT_AMBULATORY_CARE_PROVIDER_SITE_OTHER): Payer: BC Managed Care – PPO | Admitting: Internal Medicine

## 2020-07-17 ENCOUNTER — Ambulatory Visit (INDEPENDENT_AMBULATORY_CARE_PROVIDER_SITE_OTHER): Payer: BC Managed Care – PPO

## 2020-07-17 ENCOUNTER — Other Ambulatory Visit: Payer: Self-pay

## 2020-07-17 DIAGNOSIS — J9611 Chronic respiratory failure with hypoxia: Secondary | ICD-10-CM

## 2020-07-17 DIAGNOSIS — J449 Chronic obstructive pulmonary disease, unspecified: Secondary | ICD-10-CM | POA: Diagnosis not present

## 2020-07-17 NOTE — Assessment & Plan Note (Signed)
1st placed on 02 09/2018 - HC03  10/21/2018   27  -  01/07/2020   Walked RA  2 laps @ approx 248ft each @ fast pace  stopped due to end of study, no sob sats 96%    As of 07/17/2020 rec 2lpm hs and prn daytime to keep sats > 90%            Each maintenance medication was reviewed in detail including emphasizing most importantly the difference between maintenance and prns and under what circumstances the prns are to be triggered using an action plan format where appropriate.  Total time for H and P, chart review, counseling, reviewing smi /hfa device(s) and generating customized AVS unique to this office visit / same day charting  > 30 min

## 2020-07-17 NOTE — Assessment & Plan Note (Addendum)
Quit smoking 09/2018 p presenting with acute hypercarbic/hypoxemic resp failure / acute cor pulmonale that improved  prior to initial pulmonary office visit 11/05/2018 as did hypercarbia  - PFT's  03/08/2019  FEV1 1.06 (39 % ) ratio 0.48  p 5 % improvement from saba p nothing prior to study with DLCO  12.34 (57%) corrects to 3.36 (81%)  for alv volume and FV curve classic concave curvature on exp loop  - 01/07/2020  After extensive coaching inhaler device,  effectiveness =    75% with SMI > try stiolto as mouth/throat pain on advair  - alpha one AT screen 01/07/2020 >>>  MM   Level 181   Pt is Group B in terms of symptom/risk and laba/lama therefore appropriate rx at this point >>>  Stiolto 2 puffs each am  And approp saba:  I spent extra time with pt today reviewing appropriate use of albuterol for prn use on exertion with the following points: 1) saba is for relief of sob that does not improve by walking a slower pace or resting but rather if the pt does not improve after trying this first. 2) If the pt is convinced, as many are, that saba helps recover from activity faster then it's easy to tell if this is the case by re-challenging : ie stop, take the inhaler, then p 5 minutes try the exact same activity (intensity of workload) that just caused the symptoms and see if they are substantially diminished or not after saba 3) if there is an activity that reproducibly causes the symptoms, try the saba 15 min before the activity on alternate days   If in fact the saba really does help, then fine to continue to use it prn but advised may need to look closer at the maintenance regimen being used to achieve better control of airways disease with exertion.

## 2020-07-17 NOTE — Progress Notes (Signed)
Evelyn Hughes, female    DOB: 09/09/1955    MRN: [id number]   Brief patient profile:  54  yowf MM/quit smoking 09/2018 with freq cough dx as bronchitis growing up in house of smokers has used inhalers as needed short term only with good activity tolerance then new leg swelling April 2020 eventually admitted x 2    Admit date: 09/27/2018 Discharge date: 10/06/2018    Discharge Diagnoses:      Active Hospital Problems   Diagnosis Date Noted  . Pressure injury of skin 09/28/2018  . Respiratory failure with hypoxia and hypercapnia ([place]) 09/27/2018           Vitals:   10/06/18 0847 10/06/18 0848  BP:    Pulse:    Resp:    Temp:    SpO2: 98% 98%    History of present illness:  The patient is an ill-appearing 65 year old female, current every day smoker recently admitted to the hospital ( 5/4-5/8) and ultimately diagnosed with a combination of what was likely cor pulmonale and acute hypoxic respiratory distress and failure requiring intubation. She self extubated and was ultimately placed on nasal cannula and discharged home with oxygen. Per the husband's report and per EMS report the patient has had a gradual decline especially over the last 12 to 24 hours refusing to eat or drink with AMS. Husband called 17, Paramedics found the patient to be hypoxic, Increased her oxygen and transported her to the ED. She was intubated within 30 minutes of arrival. PCCM admitted to ICU and managed care.  [facility] assumed care on 09/30/2018.  Admission 5/4-5/8 with intubation for respiratory failure Re-admission 5/10 for Acute on chronic Respiratory Failure Extubated on 09/29/18  5/4 CT abd/ pelvis >> 1. No acute intra-abdominal process. 2. Small ascites and mild anasarca.  5/4 CT chest >> 1. Moderate right and small left pleural effusions. No pneumonia. 2. Mild right heart enlargement with dilated main pulmonary artery, suggestive of pulmonary arterial hypertension. 3.  Emphysema  4. Aortic atherosclerosis  5/4 CTH >> Minimal frontal and parietal lobe atrophy. Otherwise negative exam.  09/22/2018 Echo The left ventricle has normal systolic function, with an ejection fraction of 55-60%. The cavity size was normal. Left ventricular diastolic function could not be evaluated. The right ventricle has normal systolc function. The cavity was mildly enlarged. Right ventricular systolic pressure is mildly elevated with an estimated pressure of 46.6 mmHg. Right atrial size was mildly dilated. The mitral valve is grossly normal. The tricuspid valve was grossly normal. The aortic valve is tricuspid No stenosis of the aortic valve. Limited study; normal LV function; mild RAE and RVE; moderate RV dysfunction; trace TR; mild pulmonary hypertension.  5/5 Doppler Studies Right: There is no evidence of deep vein thrombosis in the lower extremity. No cystic structure found in the popliteal fossa. Left: There is no evidence of deep vein thrombosis in the lower extremity. No cystic structure found in the popliteal fossa. Micro Data:  5/10 Blood Cultures x 2 5/10 Tracheal Aspirate- GPCs 5/10 Urine Legionella 5/10 Urine Strep 5/10 Covid Negative  Antimicrobials:  Completed Cefepime  Completed IV Vanc   Started on po Azithromycin x 5 days on 10/06/18 for her COPD  10/06/18: Patient seen and examined at her bedside.  No acute events overnight.  RHC was done yesterday.  Patient will follow-up with cardiology outpatient.  She has no new complaints.  She denies chest pain or dyspnea.  Admits to intermittent nonproductive cough with no subjective fevers  or chills.  States she feels well.  Bilateral lower extremity edema improving.  TED hose in place.  She wants to go home.  Mild leukocytosis which is improving.  Procalcitonin obtained and is negative.  Started on p.o. azithromycin x5 days for its anti-inflammatory properties.   On the day of discharge, the  patient was hemodynamically stable.  She will need to follow-up with her primary care provider, cardiology, and pulmonology posthospitalization.    Hospital Course:  Active Problems:   Respiratory failure with hypoxia and hypercapnia (HCC)   Pressure injury of skin  Resolving acute hypoxic hypercarbic respiratory failure, multifactorial secondary to acute volume overload, bilateral pleural effusion, COPD exacerbation Intubated on 09/27/2018.  Extubated on 09/29/2018 Currently on 2 L of oxygen by nasal cannula and saturating 97% Completed 5 days of IV cefepime Independent reviewed chest x-ray done on 10/03/2018 which showed small bilateral pleural effusions and right lower lobe atelectasis Patient encouraged to use incentive spirometer. 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 and right ventricular failure Heart failure team is following Continue diuresis as recommended by heart failure team Continue TED hose per heart failure team V/Qshowed very low probability PFTs with DLCOpending Pulmonary rehab on discharge Net I&O -2.5 L since admission Continue cardiac medications On Lasix 20 mg daily and spironolactone 25 mg daily as recommended by cardiology Follow-up with cardiology outpatient  Bilateral lower extremity 2+ pitting edema, improving with TED hose Improving with TED hose and diuretics  Bilateral Doppler ultrasound negative for DVT No obvious discoloration or hyperpigmentation  Emphysema COPD exacerbation Counseled on the importance of tobacco use cessation Maintain O2 saturation greater than 90% Continue inhalers Started Z-Pak, azithromycin 250 mg daily x5 days  Leukocytosis in the setting of recent CAP and steroid use Leukocytosis improving with  WBC 16 K from 20 K Procalcitonin is negative Afebrile Continue p.o. azithromycin to 50 mg daily x5 days  Resolved hypovolemic hyponatremia   Resolved hypomagnesemia post repletion  Tobacco use disorder Counseled on tobacco cessation at bedside Continue nicotine patch        History of Present Illness  11/05/2018  Pulmonary/ 1st office eval/Wert  Chief Complaint  Patient presents with  . Pulmonary Consult    recent hospital admission for cor pulmonale and acute respiratory distress requiring intubation. She states that as far as she is aware she has never had any problems at all with her breathing and she denies any respiratory co's today.   Dyspnea:  Able to walk 13 min s 02 improving spiriva/wixella  Cough: none now Sleep: on 02 2lpm and able to sleep on side  Bed flat  SABA use: ventolin but not needing  rec 02 2lpm at bedtime and adjust during the day to keep it over 90% No change in your pulmonary medications   Please schedule a follow up office visit in 6 weeks, call sooner if needed with all medications   03/10/2019  f/u ov/Wert re:  GOLD III  Chief Complaint  Patient presents with  . Follow-up    PFT done 03/08/19. Pt states her breathing has been doing well. No co's today.   Dyspnea:  No regular sustained walking but able to cross parking lot and do a [employer] sats in mid 90s RA Cough: no Sleeping: no resp symptoms, 2 pillows SABA use: never  02: 2lpm  hs only  rec No change in medications or 02 Try to stay as active as you can and measure your 02 at peak exercise levels a few times a week   televisit 06/11/19 No change in medications for now but Judithann Sauger may be a better option for you when we see you  Stay as active as you can and get the vaccine for Covid 19  as soon as it's offered. Please schedule a follow up visit in 6 months but call sooner if needed with your drug formulary if available for a list of cheapest alternatives to your spiriva and  advair which tend to cause mouth and throat irritation Add: will need alpha one screening on return (I'll explain that, it's just a simple blood test)     pfizer vaccination for covid 19 2nd on 08/12/19    01/07/2020  f/u ov/Wert re: GOLD III  copd / advair/ spiriva bother  mouth  Chief Complaint  Patient presents with  . Follow-up    no complaints   Dyspnea:  Does fine at [employer] / walking outside unless it's hot  Cough: some throat clearing Sleeping: no resp problems @ hob on  2 pillows / flat bed  SABA use: none  02: 2lpm hs only  rec Plan A = Automatic = Always=    Stiolto 2 puffs each am then rinse and gargle (stop advair and spiriva) Work on inhaler technique:  Plan B = Backup (to supplement plan A, not to replace it) Only use your albuterol inhaler as a rescue medication Please remember to go to the lab department   for your tests - we will call you with the results when they are available. If your insurance requires an overnight oximetry then we can order it.  Please schedule a follow up visit in  6  months but call sooner if needed    07/17/2020  f/u ov/Wert re: GOLD III copd/ 02 hs only  Chief Complaint  Patient presents with  . Follow-up    Doing well. No complaints at this time.  Dyspnea:  No change able to walk down street and back s stopping x 15 min RA Cough: none Sleeping: ok on flat 2 pillows s am flare SABA use: none 02: 2lpm hs only - not checking sats at peak ex though  Covid status:   vax max    No obvious day to day or daytime variability or assoc excess/ purulent sputum or mucus plugs or hemoptysis or cp or chest tightness, subjective wheeze or overt sinus or hb symptoms.   Sleeping  without nocturnal  or early am exacerbation  of respiratory  c/o's or need for noct saba. Also denies any obvious fluctuation of symptoms with weather or environmental changes or other aggravating or alleviating factors except as outlined above   No unusual exposure hx or  h/o childhood pna/ asthma or knowledge of premature birth.  Current Allergies, Complete Past Medical History, Past Surgical History, Family History, and Social History were reviewed in [employer] record.  ROS  The following are not active complaints unless bolded Hoarseness, sore throat, dysphagia, dental problems, itching, sneezing,  nasal congestion or discharge of excess mucus or purulent secretions, ear ache,   fever, chills, sweats, unintended wt loss or wt gain, classically pleuritic or exertional cp,  orthopnea pnd or arm/hand swelling  or leg swelling, presyncope, palpitations, abdominal pain, anorexia, nausea, vomiting, diarrhea  or change in bowel habits or change in bladder habits,  change in stools or change in urine, dysuria, hematuria,  rash, arthralgias, visual complaints, headache, numbness, weakness or ataxia or problems with walking or coordination,  change in mood or  memory.        Current Meds  Medication Sig  . albuterol (VENTOLIN HFA) 108 (90 Base) MCG/ACT inhaler Inhale 2 puffs into the lungs every 6 (six) hours as needed for wheezing or shortness of breath.  . furosemide (LASIX) 20 MG tablet TAKE 1 TABLET BY MOUTH EVERY OTHER DAY  . OXYGEN 1-2 lpm oxygen depending on level of activity  . spironolactone (ALDACTONE) 25 MG tablet TAKE 1 TABLET BY MOUTH ONCE DAILY . **NEEDS APPOINTMENT FOR FUTURE REFILLS**  . Tiotropium Bromide-Olodaterol (STIOLTO RESPIMAT) 2.5-2.5 MCG/ACT AERS Inhale 2 puffs into the lungs daily.                       Objective:      07/17/2020       100 01/07/2020        105  03/10/19 113 lb (51.3 kg)  02/10/19 111 lb 6 oz (50.5 kg)  11/18/18 100 lb 3.2 oz (45.5 kg)    Vital signs reviewed  07/17/2020  - Note at rest 02 sats  95% on RA   General appearance:    Thin pleasantly anxious wf nad     HEENT : pt wearing mask not removed for exam due to covid -19 concerns.    NECK :  without JVD/Nodes/TM/ nl carotid  upstrokes bilaterally   LUNGS: no acc muscle use,  Mod barrel  contour chest wall with bilateral  Distant bs s audible wheeze and  without cough on insp or exp maneuvers and mod  Hyperresonant  to  percussion bilaterally     CV:  RRR  no s3 or murmur or increase in P2, and no edema   ABD:  soft and nontender with pos mid insp Hoover's  in the supine position. No bruits or organomegaly appreciated, bowel sounds nl  MS:     ext warm without deformities, calf tenderness, cyanosis or clubbing No obvious joint restrictions   SKIN: warm and dry without lesions    NEURO:  alert, approp, nl sensorium with  no motor or cerebellar deficits apparent.        CXR PA and Lateral:   07/17/2020 :    I personally reviewed images and agree with radiology impression as follows:   1. No radiographic evidence of acute cardiopulmonary disease. 2. Severe emphysema.       Assessment

## 2020-07-17 NOTE — Patient Instructions (Addendum)
Keep up the walking - try to build to 30 minutes if possible daily  Make sure you check your oxygen saturation at your highest level of activity to be sure it stays over 90% and keep track of it at least once a week, more often if breathing getting worse, and let me know if losing ground.   Please remember to go to the  x-ray department  for your tests - we will call you with the results when they are available    Please schedule a follow up visit in12  months but call sooner if needed

## 2020-07-18 ENCOUNTER — Encounter: Payer: Self-pay | Admitting: *Deleted

## 2020-07-20 ENCOUNTER — Other Ambulatory Visit (HOSPITAL_COMMUNITY): Payer: Self-pay | Admitting: Internal Medicine

## 2020-07-20 DIAGNOSIS — I509 Heart failure, unspecified: Secondary | ICD-10-CM

## 2020-07-20 NOTE — Telephone Encounter (Signed)
Nothing noted in message. Will close encounter.  

## 2020-08-20 ENCOUNTER — Other Ambulatory Visit (HOSPITAL_COMMUNITY): Payer: Self-pay | Admitting: Internal Medicine

## 2020-08-20 DIAGNOSIS — I509 Heart failure, unspecified: Secondary | ICD-10-CM

## 2020-09-11 ENCOUNTER — Other Ambulatory Visit (HOSPITAL_BASED_OUTPATIENT_CLINIC_OR_DEPARTMENT_OTHER): Payer: Self-pay

## 2020-09-11 ENCOUNTER — Ambulatory Visit: Payer: BC Managed Care – PPO | Attending: Internal Medicine

## 2020-09-11 DIAGNOSIS — Z23 Encounter for immunization: Secondary | ICD-10-CM

## 2020-09-11 MED ORDER — PFIZER-BIONT COVID-19 VAC-TRIS 30 MCG/0.3ML IM SUSP
INTRAMUSCULAR | 0 refills | Status: DC
  Filled 2020-09-11: qty 0.3, 1d supply, fill #0

## 2020-09-11 NOTE — Progress Notes (Signed)
   Covid-19 Vaccination Clinic  Name:  Evelyn Hughes    MRN: [id number] DOB: October 23, 1955  09/11/2020  Evelyn Hughes was observed post Covid-19 immunization for 15 minutes without incident. She was provided with Vaccine Information Sheet and instruction to access the V-Safe system.   Evelyn Hughes was instructed to call 911 with any severe reactions post vaccine: Marland Kitchen Difficulty breathing  . Swelling of face and throat  . A fast heartbeat  . A bad rash all over body  . Dizziness and weakness   Immunizations Administered    Name Date Dose VIS Date Route   PFIZER Comrnaty(Gray TOP) Covid-19 Vaccine 09/11/2020 11:47 AM 0.3 mL 04/27/2020 Intramuscular   Manufacturer: [place]   Lot: [id number]   ND[facility]: [phone]

## 2020-11-02 ENCOUNTER — Telehealth: Payer: Self-pay | Admitting: Internal Medicine

## 2020-11-02 NOTE — Telephone Encounter (Signed)
Pt stopped by office today and dropped off [employer] form and is needing for it to be faxed over to [facility] once completed. Pt would like to be called when it has been sent at [phone]. The form was placed in Dr. Morrison Old box on 11/02/2020.

## 2020-11-02 NOTE — Telephone Encounter (Signed)
Routing to [place] as an Pharmacist, hospital.

## 2020-11-03 NOTE — Telephone Encounter (Signed)
Form filled out and given to MW to sign  Will hold in my basket until done

## 2020-11-06 NOTE — Telephone Encounter (Signed)
Form faxed, received confirmation, and placed in scan  I called and left detailed msg for the pt letting her know ok per [facility]

## 2021-01-15 ENCOUNTER — Other Ambulatory Visit: Payer: Self-pay | Admitting: Internal Medicine

## 2021-01-15 ENCOUNTER — Other Ambulatory Visit (HOSPITAL_COMMUNITY): Payer: Self-pay | Admitting: Internal Medicine

## 2021-01-15 DIAGNOSIS — I509 Heart failure, unspecified: Secondary | ICD-10-CM

## 2021-02-19 ENCOUNTER — Other Ambulatory Visit (HOSPITAL_COMMUNITY): Payer: Self-pay | Admitting: Internal Medicine

## 2021-02-19 DIAGNOSIS — I509 Heart failure, unspecified: Secondary | ICD-10-CM

## 2021-03-18 ENCOUNTER — Other Ambulatory Visit (HOSPITAL_COMMUNITY): Payer: Self-pay | Admitting: Internal Medicine

## 2021-03-18 DIAGNOSIS — I509 Heart failure, unspecified: Secondary | ICD-10-CM

## 2021-03-27 ENCOUNTER — Ambulatory Visit: Payer: BC Managed Care – PPO | Attending: Internal Medicine

## 2021-03-27 ENCOUNTER — Other Ambulatory Visit (HOSPITAL_BASED_OUTPATIENT_CLINIC_OR_DEPARTMENT_OTHER): Payer: Self-pay

## 2021-03-27 DIAGNOSIS — Z23 Encounter for immunization: Secondary | ICD-10-CM

## 2021-03-27 MED ORDER — PFIZER COVID-19 VAC BIVALENT 30 MCG/0.3ML IM SUSP
INTRAMUSCULAR | 0 refills | Status: DC
Start: 2021-03-27 — End: 2022-04-24
  Filled 2021-03-27: qty 0.3, 1d supply, fill #0

## 2021-03-27 NOTE — Progress Notes (Signed)
   Covid-19 Vaccination Clinic  Name:  Evelyn Hughes    MRN: [id number] DOB: 05/01/1956  03/27/2021  [name] was observed post Covid-19 immunization for 15 minutes without incident. She was provided with Vaccine Information Sheet and instruction to access the V-Safe system.   [name] was instructed to call 911 with any severe reactions post vaccine: Difficulty breathing  Swelling of face and throat  A fast heartbeat  A bad rash all over body  Dizziness and weakness   Immunizations Administered     Name Date Dose VIS Date Route   Pfizer Covid-19 Vaccine Bivalent Booster 03/27/2021 11:15 AM 0.3 mL 01/17/2021 Intramuscular   Manufacturer: [place]   Lot: [id number]   [place]: [phone]

## 2021-04-10 ENCOUNTER — Encounter (HOSPITAL_COMMUNITY): Payer: Self-pay | Admitting: Internal Medicine

## 2021-04-10 ENCOUNTER — Ambulatory Visit (HOSPITAL_COMMUNITY)
Admission: RE | Admit: 2021-04-10 | Discharge: 2021-04-10 | Disposition: A | Discharge status: Home / Self Care | Payer: BC Managed Care – PPO | Source: Ambulatory Visit | Attending: Internal Medicine | Admitting: Internal Medicine

## 2021-04-10 VITALS — BP 110/76 | HR 107 | Wt 94.2 lb

## 2021-04-10 DIAGNOSIS — I2510 Atherosclerotic heart disease of native coronary artery without angina pectoris: Secondary | ICD-10-CM

## 2021-04-10 DIAGNOSIS — I50810 Right heart failure, unspecified: Secondary | ICD-10-CM | POA: Diagnosis not present

## 2021-04-10 DIAGNOSIS — J449 Chronic obstructive pulmonary disease, unspecified: Secondary | ICD-10-CM | POA: Diagnosis not present

## 2021-04-10 DIAGNOSIS — Z87891 Personal history of nicotine dependence: Secondary | ICD-10-CM | POA: Insufficient documentation

## 2021-04-10 DIAGNOSIS — J9610 Chronic respiratory failure, unspecified whether with hypoxia or hypercapnia: Secondary | ICD-10-CM | POA: Diagnosis not present

## 2021-04-10 DIAGNOSIS — I2729 Other secondary pulmonary hypertension: Secondary | ICD-10-CM | POA: Diagnosis not present

## 2021-04-10 DIAGNOSIS — I2781 Cor pulmonale (chronic): Secondary | ICD-10-CM | POA: Insufficient documentation

## 2021-04-10 DIAGNOSIS — E785 Hyperlipidemia, unspecified: Secondary | ICD-10-CM | POA: Diagnosis not present

## 2021-04-10 DIAGNOSIS — Z86718 Personal history of other venous thrombosis and embolism: Secondary | ICD-10-CM | POA: Diagnosis not present

## 2021-04-10 DIAGNOSIS — I2721 Secondary pulmonary arterial hypertension: Secondary | ICD-10-CM | POA: Diagnosis not present

## 2021-04-10 DIAGNOSIS — I5032 Chronic diastolic (congestive) heart failure: Secondary | ICD-10-CM | POA: Insufficient documentation

## 2021-04-10 LAB — CBC
HCT: 43.5 % (ref 36.0–46.0)
Hemoglobin: 14 g/dL (ref 12.0–15.0)
MCH: 29.9 pg (ref 26.0–34.0)
MCHC: 32.2 g/dL (ref 30.0–36.0)
MCV: 92.8 fL (ref 80.0–100.0)
Platelets: 389 10*3/uL (ref 150–400)
RBC: 4.69 MIL/uL (ref 3.87–5.11)
RDW: 12.5 % (ref 11.5–15.5)
WBC: 9.5 10*3/uL (ref 4.0–10.5)
nRBC: 0 % (ref 0.0–0.2)

## 2021-04-10 LAB — LIPID PANEL
Cholesterol: 242 mg/dL — ABNORMAL HIGH (ref 0–200)
HDL: 90 mg/dL (ref >40)
LDL Cholesterol: 142 mg/dL — ABNORMAL HIGH (ref 0–99)
Total CHOL/HDL Ratio: 2.7 RATIO
Triglycerides: 50 mg/dL (ref <150)
VLDL: 10 mg/dL (ref 0–40)

## 2021-04-10 LAB — COMPREHENSIVE METABOLIC PANEL
ALT: 13 U/L (ref 0–44)
AST: 18 U/L (ref 15–41)
Albumin: 4.3 g/dL (ref 3.5–5.0)
Alkaline Phosphatase: 64 U/L (ref 38–126)
Anion gap: 11 (ref 5–15)
BUN: 17 mg/dL (ref 8–23)
CO2: 30 mmol/L (ref 22–32)
Calcium: 9.6 mg/dL (ref 8.9–10.3)
Chloride: 97 mmol/L — ABNORMAL LOW (ref 98–111)
Creatinine, Ser: 0.65 mg/dL (ref 0.44–1.00)
GFR, Estimated: >60 mL/min (ref >60)
Glucose, Bld: 98 mg/dL (ref 70–99)
Potassium: 4.3 mmol/L (ref 3.5–5.1)
Sodium: 138 mmol/L (ref 135–145)
Total Bilirubin: 0.6 mg/dL (ref 0.3–1.2)
Total Protein: 6.5 g/dL (ref 6.5–8.1)

## 2021-04-10 MED ORDER — ASPIRIN EC 81 MG PO TBEC: 81.0000 mg | DELAYED_RELEASE_TABLET | Freq: Every day | ORAL | 3 refills | Status: DC

## 2021-04-10 MED ORDER — ROSUVASTATIN CALCIUM 5 MG PO TABS: 5.0000 mg | ORAL_TABLET | Freq: Every day | ORAL | 3 refills | Status: DC

## 2021-04-10 NOTE — Patient Instructions (Signed)
Start Crestor 5mg  in the evening. Start Aspirin 81mg  Daily.  Labs done today, your results will be available in MyChart, we will contact you for abnormal readings.  Your physician recommends that you schedule a follow-up appointment in: 1 year with Echocardiogram. **Call in September 2023** for appointment**  If you have any questions or concerns before your next appointment please send [place] a message through [place] or call our office at [phone].    TO LEAVE A MESSAGE FOR THE NURSE SELECT OPTION 2, PLEASE LEAVE A MESSAGE INCLUDING: YOUR NAME DATE OF BIRTH CALL BACK NUMBER REASON FOR CALL**this is important as we prioritize the call backs  YOU WILL RECEIVE A CALL BACK THE SAME DAY AS LONG AS YOU CALL BEFORE 4:00 PM  At the [facility], you and your health needs are our priority. As part of our continuing mission to provide you with exceptional heart care, we have created designated Provider Care Teams. These Care Teams include your primary Cardiologist (physician) and Advanced Practice Providers (APPs- Physician Assistants and Nurse Practitioners) who all work together to provide you with the care you need, when you need it.   You may see any of the following providers on your designated Care Team at your next follow up: Dr Glori Bickers Dr Haynes Kerns, NP Lyda Jester, [place] [facility] [place], [place] Audry Riles, PharmD   Please be sure to bring in all your medications bottles to every appointment.

## 2021-04-10 NOTE — Progress Notes (Signed)
Advanced Heart Failure Clinic Note   Date:  04/10/2021   ID:  Evelyn Hughes, DOB November 05, 1955, MRN [id number]  Location: Home  Provider location: [place] [facility] Type of Visit: Established patient   PCP:  Evelyn Blackbird, Hughes (Inactive)  Cardiologist:  None Primary HF: Evelyn Hughes   Chief Complaint: Heart Failure   History of Present Illness: Evelyn Hughes is a 65 y.o. female with a history of tobacco abuse, COPD, pulmonary HTN, and chronic respiratory failure.    Admitted to [facility] 09/27/2018 with AMS found to be hypoxic on arrival requiring intubation. She was later extubated on 09/29/2018. Placed on IV lasix due to leg edema. Once diuresed, RHC was performed. RHC showed minimally elevated PA pressures and mildly elevated cardiac output. She was discharged on lasix 20 mg daily. Discharge weight was 111 pounds.   She presents for routine f/u. Here with her husband Evelyn Hughes. Says she is feeling good. Has lost some weight 100 -> 94lbs. Mild SOB if she is walking too fast. 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 No further swelling.   Echo 9/21 EF 45-50% (I thought 50-55%) wit basilar to mid HK of inferior I nferior lateral wall.   PFTs FEV1 1.01 (37%) FVC   2.17 (62% FEF 25-75 0.36 DLCO 57%  RHC 10/05/18  RA = 1 ( v waves to 10 due to TR) RV = 32/4 PA = 35/11 (24) PCW = 10 Fick cardiac output/index = 6.42/4.15 PVR = 2.2 WU Ao sat = 99% PA sat =79%, 80% SVC = 79%   ECHO 09/22/2018 LVEF 55-60%, RV normal  Past Medical History:  Diagnosis Date   Allergy    Past Surgical History:  Procedure Laterality Date   RIGHT HEART CATH N/A 10/05/2018   Procedure: RIGHT HEART CATH;  Surgeon: Evelyn Hughes;  Location: [place] [facility];  Service: Cardiovascular;  Laterality: N/A;     Current Outpatient Medications  Medication Sig Dispense Refill   albuterol (VENTOLIN HFA) 108 (90 Base) MCG/ACT  inhaler Inhale 2 puffs into the lungs every 6 (six) hours as needed for wheezing or shortness of breath. (Patient taking differently: Inhale 2 puffs into the lungs every 6 (six) hours as needed for wheezing or shortness of breath. As needed) 1 Inhaler 2   furosemide (LASIX) 20 MG tablet Take 1 tablet (20 mg total) by mouth every other day. 45 tablet 3   OXYGEN 1-2 l pm oxygen depending on level of activity     spironolactone (ALDACTONE) 25 MG tablet TAKE 1 TABLET BY MOUTH ONCE DAILY --LAST  REFILL  WITHOUT  OFFICE  VISIT  [id number] 30 tablet 0   STIOLTO RESPIMAT 2.5-2.5 MCG/ACT AERS INHALE 2 PUFFS BY MOUTH ONCE DAILY 4 g 3   COVID-19 mRNA bivalent vaccine, Pfizer, (PFIZER COVID-19 VAC BIVALENT) injection Inject into the muscle. (Patient not taking: Reported on 04/10/2021) 0.3 mL 0   COVID-19 mRNA Vac-TriS, Pfizer, (PFIZER-BIONT COVID-19 VAC-TRIS) SUSP injection Inject into the muscle. (Patient not taking: Reported on 04/10/2021) 0.3 mL 0   No current facility-administered medications for this encounter.    Allergies:   Expectorant cough control [guaifenesin], Sulfa antibiotics, and Penicillins   Social History:  The patient  reports that she quit smoking about 2 years ago. Her smoking use included cigarettes. She has a 96.00 pack-year smoking history. She has never used smokeless tobacco.  She reports that she does not drink alcohol and does not use drugs.   Family History:  The patient's family history includes Cancer in her mother.   ROS:  Please see the history of present illness.   All other systems are personally reviewed and negative.   Exam:   General:  Thin. Well appearing. No resp difficulty HEENT: normal Neck: supple. no JVD. Carotids 2+ bilat; no bruits. No lymphadenopathy or thryomegaly appreciated. Cor: PMI nondisplaced. Regular rate & rhythm. No rubs, gallops or murmurs. Lungs: decreased throughout Abdomen: soft, nontender, nondistended. No hepatosplenomegaly. No bruits or  masses. Good bowel sounds. Extremities: no cyanosis, clubbing, rash, edema Neuro: alert & orientedx3, cranial nerves grossly intact. moves all 4 extremities w/o difficulty. Affect pleasant  Recent Labs: No results found for requested labs within last 8760 hours.  Personally reviewed   Wt Readings from Last 3 Encounters:  04/10/21 42.7 kg (94 lb 3.2 oz)  07/17/20 45.7 kg (100 lb 12.8 oz)  02/04/20 47.6 kg (105 lb)    NSR 94 RAE Personally reviewed  ASSESSMENT AND PLAN:  1. 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 LE u/s negative for DVT - Much improved with diuresis and O2. Only using O2 at night - stable NYHA II-III - 10/03/2018 VQ negative.  - RHC  Minimally elevated PA pressures and PVR 2.2.  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 No significant PAH   2. Chronic respiratory failure due to advanced COPD - Wears O2 at night and as needed - Uses pulse ox to ensure sat > 90% - Follows with Evelyn. Melvyn Hughes - No change   3. Chronic diastolic HF - NYHA II-III. Volume status stable. Continue lasix every other day.   - Echo 9/21 EF 50% mild inferior WMA - Continue 25 mg spironolactone daily.  - Check labs today   4. Coronary calcium on CT - echo with mild inferior WMA - No s/s angina - start ASA and low-dose statin - No role for cath as she is not surgical candidate and not having angina  4. H/o Remote DVT due to OCPs - LE dopplers negative  - VQ negative   Signed, Evelyn Hughes  04/10/2021 12:04 PM  [place] [address] [facility] and [place] [address] [phone] (office) [phone] (fax)

## 2021-04-19 ENCOUNTER — Other Ambulatory Visit (HOSPITAL_COMMUNITY): Payer: Self-pay | Admitting: Internal Medicine

## 2021-04-19 DIAGNOSIS — I509 Heart failure, unspecified: Secondary | ICD-10-CM

## 2021-05-09 ENCOUNTER — Telehealth (HOSPITAL_COMMUNITY): Payer: Self-pay | Admitting: *Deleted

## 2021-05-09 NOTE — Telephone Encounter (Signed)
Copy of last labs mailed to patients home address as requested.

## 2021-06-03 ENCOUNTER — Other Ambulatory Visit: Payer: Self-pay | Admitting: Internal Medicine

## 2021-07-07 ENCOUNTER — Other Ambulatory Visit: Payer: Self-pay | Admitting: Internal Medicine

## 2021-07-18 ENCOUNTER — Other Ambulatory Visit: Payer: Self-pay

## 2021-07-18 ENCOUNTER — Ambulatory Visit (INDEPENDENT_AMBULATORY_CARE_PROVIDER_SITE_OTHER): Payer: Medicare Other | Admitting: Internal Medicine

## 2021-07-18 ENCOUNTER — Encounter: Payer: Self-pay | Admitting: Internal Medicine

## 2021-07-18 DIAGNOSIS — J449 Chronic obstructive pulmonary disease, unspecified: Secondary | ICD-10-CM

## 2021-07-18 DIAGNOSIS — J9611 Chronic respiratory failure with hypoxia: Secondary | ICD-10-CM | POA: Diagnosis not present

## 2021-07-18 NOTE — Patient Instructions (Signed)
I will be referring to Evelyn Form NP for lung cancer screening program. ? ?Make sure you check your oxygen saturation  AT  your highest level of activity (not after you stop)   to be sure it stays over 90% and adjust  02 flow upward to maintain this level if needed but remember to turn it back to previous settings when you stop (to conserve your supply).  ? ? ?Please schedule a follow up visit in 12 months but call sooner if needed  ?

## 2021-07-18 NOTE — Assessment & Plan Note (Signed)
Quit smoking 09/2018/MM p presenting with acute hypercarbic/hypoxemic resp failure / acute cor pulmonale that improved  prior to initial pulmonary office visit 11/05/2018 as did hypercarbia ? - PFT's  03/08/2019  FEV1 1.06 (39 % ) ratio 0.48  p 5 % improvement from saba p nothing prior to study with DLCO  12.34 (57%) corrects to 3.36 (81%)  for alv volume and FV curve classic concave curvature on exp loop  ?- 01/07/2020  After extensive coaching inhaler device,  effectiveness =    75% with SMI > try stiolto as mouth/throat pain on advair  ?- alpha one AT screen 01/07/2020 >>>  MM   Level 181  ? ?Pt is Group B in terms of symptom/risk and laba/lama therefore appropriate rx at this point >>>  stiolto 2 each am  ? ?Low-dose CT lung cancer screening is recommended for patients who ?are 71-41 years of age with a 20+ pack-year history of smoking and ?who are currently smoking or quit <=15 years ago. ?No coughing up blood  ?No unintentional weight loss of > 15 pounds in the last 6 months  ?>>. Referred for shared decision making  ? ?  ?

## 2021-07-18 NOTE — Assessment & Plan Note (Signed)
1st placed on 02 09/2018 ?- HC03  10/21/2018   27  ?-  01/07/2020   Walked RA  2 laps @ approx 253ft each @ fast pace  stopped due to end of study, no sob sats 96%  ? ? ?Advised: ?Make sure you check your oxygen saturation at your highest level of activity to be sure it stays over 90% and keep track of it at least once a week, more often if breathing getting worse, and let me know if losing ground.  ? ?F/u yearly sooner prn ? ?    ?  ? ?Each maintenance medication was reviewed in detail including emphasizing most importantly the difference between maintenance and prns and under what circumstances the prns are to be triggered using an action plan format where appropriate. ? ?Total time for H and P, chart review, counseling, reviewing smi/02 device(s) and generating customized AVS unique to this office visit / same day charting =24 min  ?    ?

## 2021-07-18 NOTE — Progress Notes (Signed)
Evelyn Hughes, female    DOB: March 28, 1956    MRN: [id number]   Brief patient profile:  32  yowf MM/quit smoking 09/2018 with freq cough dx as bronchitis growing up in house of smokers has used inhalers as needed short term only with good activity tolerance then new leg swelling April 2020 eventually admitted x 2    Admit date: 09/27/2018 Discharge date: 10/06/2018     Discharge Diagnoses:      Active Hospital Problems    Diagnosis Date Noted   Pressure injury of skin 09/28/2018   Respiratory failure with hypoxia and hypercapnia (HCC) 09/27/2018             Vitals:    10/06/18 0847 10/06/18 0848  BP:      Pulse:      Resp:      Temp:      SpO2: 98% 98%     History of present illness:  The patient is an ill-appearing 66 year old female, current every day smoker recently admitted to the hospital ( 5/4-5/8)  and ultimately diagnosed with a combination of what was likely cor pulmonale and acute hypoxic respiratory distress and failure requiring intubation.  She self extubated and was ultimately placed on nasal cannula and discharged home with oxygen.  Per the husband's report and per EMS report the patient has had a gradual decline especially over the last 12 to 24 hours refusing to eat or drink with AMS. Husband called 20, Paramedics found the patient to be hypoxic, Increased her oxygen and transported her to the ED. She was intubated within 30 minutes of arrival. PCCM admitted to ICU and managed care.   TR[facility] assumed care on 09/30/2018.   Admission 5/4-5/8 with intubation for respiratory failure Re-admission 5/10 for Acute on chronic Respiratory Failure Extubated on 09/29/18   5/4 CT abd/ pelvis >> 1.  No acute intra-abdominal process. 2. Small ascites and mild anasarca.   5/4 CT chest >> 1. Moderate right and small left pleural effusions.  No pneumonia. 2. Mild right heart enlargement with dilated main pulmonary artery, suggestive of pulmonary arterial hypertension. 3.   Emphysema  4.  Aortic atherosclerosis    5/4 CTH >> Minimal frontal and parietal lobe atrophy.  Otherwise negative exam.   09/22/2018 Echo The left ventricle has normal systolic function, with an ejection fraction of 55-60%. The cavity size was normal. Left ventricular diastolic function could not be evaluated.  The right ventricle has normal systolc function. The cavity was mildly enlarged. Right ventricular systolic pressure is mildly elevated with an estimated pressure of 46.6 mmHg.  Right atrial size was mildly dilated.  The mitral valve is grossly normal.  The tricuspid valve was grossly normal.  The aortic valve is tricuspid No stenosis of the aortic valve.  Limited study; normal LV function; mild RAE and RVE; moderate RV dysfunction; trace TR; mild pulmonary hypertension.   5/5 Doppler Studies Right: There is no evidence of deep vein thrombosis in the lower extremity. No cystic structure found in the popliteal fossa. Left: There is no evidence of deep vein thrombosis in the lower extremity. No cystic structure found in the popliteal fossa. Micro Data:  5/10 Blood Cultures x 2 5/10 Tracheal Aspirate- GPCs 5/10 Urine Legionella 5/10 Urine Strep 5/10 Covid Negative   Antimicrobials:  Completed Cefepime  Completed IV Vanc    Started on po Azithromycin x 5 days on 10/06/18 for her COPD   10/06/18: Patient seen and examined at her bedside.  No acute events overnight.  RHC was done yesterday.  Patient will follow-up with cardiology outpatient.  She has no new complaints.  She denies chest pain or dyspnea.  Admits to intermittent nonproductive cough with no subjective fevers or chills.  States she feels well.  Bilateral lower extremity edema improving.  TED hose in place.  She wants to go home.  Mild leukocytosis which is improving.  Procalcitonin obtained and is negative.  Started on p.o. azithromycin x5 days for its anti-inflammatory properties.     On the day of discharge, the patient  was hemodynamically stable.  She will need to follow-up with her primary care provider, cardiology, and pulmonology posthospitalization.       Hospital Course:  Active Problems:   Respiratory failure with hypoxia and hypercapnia (HCC)   Pressure injury of skin  Resolving acute hypoxic hypercarbic respiratory failure, multifactorial secondary to acute volume overload, bilateral pleural effusion, COPD exacerbation Intubated on 09/27/2018.  Extubated on 09/29/2018 Currently on 2 L of oxygen by nasal cannula and saturating 97% Completed 5 days of IV cefepime Independent reviewed chest x-ray done on 10/03/2018 which showed small bilateral pleural effusions and right lower lobe atelectasis Patient encouraged to use incentive spirometer. Maintain O2 saturation greater than 90% Failed home O2 evaluation requiring 2 L of oxygen by nasal cannula continuously Continue inhalers Follow-up with pulmonology outpatient   Newly diagnosed diastolic CHF/pulmonary artery hypertension with cor pulmonale and right ventricular failure Heart failure team is following Continue diuresis as recommended by heart failure team Continue TED hose per heart failure team V/Q showed very low probability PFTs with DLCO pending Pulmonary rehab on discharge Net I&O -2.5 L since admission Continue cardiac medications On Lasix 20 mg daily and spironolactone 25 mg daily as recommended by cardiology Follow-up with cardiology outpatient   Bilateral lower extremity 2+ pitting edema, improving with TED hose Improving with TED hose and diuretics  Bilateral Doppler ultrasound negative for DVT No obvious discoloration or hyperpigmentation   Emphysema COPD exacerbation Counseled on the importance of tobacco use cessation Maintain O2 saturation greater than 90% Continue inhalers Started Z-Pak, azithromycin 250 mg daily x5 days   Leukocytosis in the setting of recent CAP and steroid use Leukocytosis improving with WBC 16 K  from 20 K Procalcitonin is negative Afebrile Continue p.o. azithromycin to 50 mg daily x5 days   Resolved hypovolemic hyponatremia    Resolved hypomagnesemia post repletion   Tobacco use disorder Counseled on tobacco cessation at bedside Continue nicotine patch         History of Present Illness  11/05/2018  Pulmonary/ 1st office eval/Evelyn Hughes  Chief Complaint  Patient presents with   Pulmonary Consult    recent hospital admission for cor pulmonale and acute respiratory distress requiring intubation. She states that as far as she is aware she has never had any problems at all with her breathing and she denies any respiratory co's today.   Dyspnea:  Able to walk 13 min s 02 improving spiriva/wixella  Cough: none now Sleep: on 02 2lpm and able to sleep on side  Bed flat  SABA use: ventolin but not needing  rec 02 2lpm at bedtime and adjust during the day to keep it over 90% No change in your pulmonary medications   Please schedule a follow up office visit in 6 weeks, call sooner if needed with all medications   03/10/2019  f/u ov/Evelyn Hughes re:  GOLD III  Chief Complaint  Patient presents with   Follow-up  PFT done 03/08/19. Pt states her breathing has been doing well. No co's today.   Dyspnea:  No regular sustained walking but able to cross parking lot and do a [employer] sats in mid 90s RA Cough: no Sleeping: no resp symptoms, 2 pillows SABA use: never  02: 2lpm hs only  rec No change in medications or 02 Try to stay as active as you can and measure your 02 at peak exercise levels a few times a week        07/17/2020  f/u ov/Evelyn Hughes re: GOLD III copd/ 02 hs only  Chief Complaint  Patient presents with   Follow-up    Doing well. No complaints at this time.  Dyspnea:  No change able to walk down street and back s stopping x 15 min RA Cough: none Sleeping: ok on flat 2 pillows s am flare SABA use: none 02: 2lpm hs only - not checking sats at peak ex though  Covid  status:   vax max  Rec Keep up the walking - try to build to 30 minutes if possible daily Make sure you check your oxygen saturation at your highest level of activity to be sure it stays over 90%     07/18/2021  f/u ov/Evelyn Hughes re: GOLD 3/ 02 dep hs only  maint on Stiolto   Chief Complaint  Patient presents with   Follow-up    Follow up. Patient says everything is going good.   Dyspnea:  occ walks neighborhood/ ok one flight  - sats 90s when checks Cough: rarely Sleeping: flat bed 2 pillows  SABA use: none  02: 2lpm hs      No obvious day to day or daytime variability or assoc excess/ purulent sputum or mucus plugs or hemoptysis or cp or chest tightness, subjective wheeze or overt sinus or hb symptoms.   Sleeping  without nocturnal  or early am exacerbation  of respiratory  c/o's or need for noct saba. Also denies any obvious fluctuation of symptoms with weather or environmental changes or other aggravating or alleviating factors except as outlined above   No unusual exposure hx or h/o childhood pna/ asthma or knowledge of premature birth.  Current Allergies, Complete Past Medical History, Past Surgical History, Family History, and Social History were reviewed in [employer] record.  ROS  The following are not active complaints unless bolded Hoarseness, sore throat, dysphagia, dental problems, itching, sneezing,  nasal congestion or discharge of excess mucus or purulent secretions, ear ache,   fever, chills, sweats, unintended wt loss or wt gain, classically pleuritic or exertional cp,  orthopnea pnd or arm/hand swelling  or leg swelling, presyncope, palpitations, abdominal pain, anorexia, nausea, vomiting, diarrhea  or change in bowel habits or change in bladder habits, change in stools or change in urine, dysuria, hematuria,  rash, arthralgias, visual complaints, headache, numbness, weakness or ataxia or problems with walking or coordination,  change in mood or   memory.        Current Meds  Medication Sig   albuterol (VENTOLIN HFA) 108 (90 Base) MCG/ACT inhaler Inhale 2 puffs into the lungs every 6 (six) hours as needed for wheezing or shortness of breath. (Patient taking differently: Inhale 2 puffs into the lungs every 6 (six) hours as needed for wheezing or shortness of breath. As needed)   aspirin EC 81 MG tablet Take 1 tablet (81 mg total) by mouth daily. Swallow whole.   furosemide (LASIX) 20 MG tablet Take 1 tablet (  20 mg total) by mouth every other day.   OXYGEN 1-2 l pm oxygen depending on level of activity   rosuvastatin (CRESTOR) 5 MG tablet Take 1 tablet (5 mg total) by mouth daily.   spironolactone (ALDACTONE) 25 MG tablet Take 1 tablet (25 mg total) by mouth daily.   STIOLTO RESPIMAT 2.5-2.5 MCG/ACT AERS INHALE 2 PUFFS BY MOUTH ONCE DAILY                       Objective:    Wt  07/18/2021           92   07/17/2020       100 01/07/2020        105  03/10/19 113 lb (51.3 kg)  02/10/19 111 lb 6 oz (50.5 kg)  11/18/18 100 lb 3.2 oz (45.5 kg)     Vital signs reviewed  07/18/2021  - Note at rest 02 sats  98% on RA   General appearance:    thin pleasant amb wf nad    HEENT : pt wearing mask not removed for exam due to covid -19 concerns.    NECK :  without JVD/Nodes/TM/ nl carotid upstrokes bilaterally   LUNGS: no acc muscle use,  Mod barrel  contour chest wall with bilateral  Distant bs s audible wheeze and  without cough on insp or exp maneuvers and mod  Hyperresonant  to  percussion bilaterally     CV:  RRR  no s3 or murmur or increase in P2, and no edema   ABD:  soft and nontender with pos mid insp Hoover's  in the supine position. No bruits or organomegaly appreciated, bowel sounds nl  MS:     ext warm without deformities, calf tenderness, cyanosis or clubbing No obvious joint restrictions   SKIN: warm and dry without lesions    NEURO:  alert, approp, nl sensorium with  no motor or cerebellar deficits apparent.                   Assessment

## 2021-08-02 ENCOUNTER — Other Ambulatory Visit: Payer: Self-pay | Admitting: Internal Medicine

## 2021-09-01 ENCOUNTER — Other Ambulatory Visit (HOSPITAL_COMMUNITY): Payer: Self-pay | Admitting: Internal Medicine

## 2021-09-01 DIAGNOSIS — I509 Heart failure, unspecified: Secondary | ICD-10-CM

## 2021-09-04 ENCOUNTER — Other Ambulatory Visit: Payer: Self-pay | Admitting: *Deleted

## 2021-09-04 DIAGNOSIS — Z122 Encounter for screening for malignant neoplasm of respiratory organs: Secondary | ICD-10-CM

## 2021-09-04 DIAGNOSIS — Z87891 Personal history of nicotine dependence: Secondary | ICD-10-CM

## 2021-10-03 ENCOUNTER — Telehealth: Payer: Self-pay | Admitting: Acute Care

## 2021-10-03 NOTE — Telephone Encounter (Signed)
Pt called and left voicemail asking if she should wait until after she has her lung screening CT scan on 10/25/21 to get her covid booster shot.  ?I returned call to pt but had to leave a voicemail (DPR) and instructed pt to wait until after she has her lung screening CT on 10/25/21 to get her covid booster. I left our call back number if pt had any further questions.  ?

## 2021-10-24 ENCOUNTER — Ambulatory Visit (INDEPENDENT_AMBULATORY_CARE_PROVIDER_SITE_OTHER): Payer: Medicare Other | Admitting: Acute Care

## 2021-10-24 ENCOUNTER — Encounter: Payer: Self-pay | Admitting: Acute Care

## 2021-10-24 DIAGNOSIS — F17210 Nicotine dependence, cigarettes, uncomplicated: Secondary | ICD-10-CM | POA: Diagnosis not present

## 2021-10-24 NOTE — Progress Notes (Addendum)
Virtual Visit via Telephone Note  I connected with Evelyn Hughes on 10/24/21 at  11:00  AM EDT by telephone and verified that I am speaking with the correct person using two identifiers.  Location: Patient: at home Provider: [place], [place], [place], [address]    I discussed the limitations, risks, security and privacy concerns of performing an evaluation and management service by telephone and the availability of in person appointments. I also discussed with the patient that there may be a patient responsible charge related to this service. The patient expressed understanding and agreed to proceed.   Shared Decision Making Visit Lung Cancer Screening Program [phone])   Eligibility: Age 25 y.o. Pack Years Smoking History Calculation 48 pack years (# packs/per year x # years smoked) Recent History of coughing up blood  no Unexplained weight loss? no ( >Than 15 pounds within the last 6 months ) Prior History Lung / other cancer no (Diagnosis within the last 5 years already requiring surveillance chest CT Scans). Smoking Status Former Smoker Former Smokers: Years since quit: 3 years  Quit Date: 09/21/2018  Visit Components: Discussion included one or more decision making aids. yes Discussion included risk/benefits of screening. yes Discussion included potential follow up diagnostic testing for abnormal scans. yes Discussion included meaning and risk of over diagnosis. yes Discussion included meaning and risk of False Positives. yes Discussion included meaning of total radiation exposure. yes  Counseling Included: Importance of adherence to annual lung cancer LDCT screening. yes Impact of comorbidities on ability to participate in the program. yes Ability and willingness to under diagnostic treatment. yes  Smoking Cessation Counseling: Current Smokers:  Discussed importance of smoking cessation. yes Information about tobacco cessation classes and interventions  provided to patient. yes Patient provided with "ticket" for LDCT Scan. yes Symptomatic Patient. no  Counseling NA Diagnosis Code: Tobacco Use Z72.0 Asymptomatic Patient yes  Counseling (Intermediate counseling: > three minutes counseling) [id number] Former Smokers:  Discussed the importance of maintaining cigarette abstinence. yes Diagnosis Code: Personal History of Nicotine Dependence. E99.371 Information about tobacco cessation classes and interventions provided to patient. Yes Patient provided with "ticket" for LDCT Scan. yes Written Order for Lung Cancer Screening with LDCT placed in Epic. Yes (CT Chest Lung Cancer Screening Low Dose W/O CM) [id number] Z12.2-Screening of respiratory organs Z87.891-Personal history of nicotine dependence  I spent 25 minutes of face to face time/virtual visit time  with  Evelyn Hughes discussing the risks and benefits of lung cancer screening. We took the time to pause the power point at intervals to allow for questions to be asked and answered to ensure understanding. We discussed that she had taken the single most powerful action possible to decrease her risk of developing lung cancer when she quit smoking. I counseled her to remain smoke free, and to contact me if she ever had the desire to smoke again so that I can provide resources and tools to help support the effort to remain smoke free. We discussed the time and location of the scan, and that either  Doroteo Glassman RN, Joella Prince, RN or I  or I will call / send a letter with the results within  24-72 hours of receiving them. She has the office contact information in the event she needs to speak with me,  she verbalized understanding of all of the above and had no further questions upon leaving the office.     I explained to the patient that there has been a high  incidence of coronary artery disease noted on these exams. I explained that this is a non-gated exam therefore degree or severity cannot be  determined. This patient is on statin therapy. I have asked the patient to follow-up with their PCP regarding any incidental finding of coronary artery disease and management with diet or medication as they feel is clinically indicated. The patient verbalized understanding of the above and had no further questions.     Magdalen Spatz, NP  10/24/2021

## 2021-10-24 NOTE — Patient Instructions (Signed)
Thank you for participating in the [facility]. It was our pleasure to meet you today. We will call you with the results of your scan within the next few days. Your scan will be assigned a Lung RADS category score by the physicians reading the scans.  This Lung RADS score determines follow up scanning.  See below for description of categories, and follow up screening recommendations. We will be in touch to schedule your follow up screening annually or based on recommendations of our providers. We will fax a copy of your scan results to your Primary Care Physician, or the physician who referred you to the program, to ensure they have the results. Please call the office if you have any questions or concerns regarding your scanning experience or results.  Our office number is [phone]. Please speak with Denise Phelps, RN. , or  Denise Buckner RN, They are  our Lung Cancer Screening RN.'s If They are unavailable when you call, Please leave a message on the voice mail. We will return your call at our earliest convenience.This voice mail is monitored several times a day.  Remember, if your scan is normal, we will scan you annually as long as you continue to meet the criteria for the program. (Age 55-77, Current smoker or smoker who has quit within the last 15 years). If you are a smoker, remember, quitting is the single most powerful action that you can take to decrease your risk of lung cancer and other pulmonary, breathing related problems. We know quitting is hard, and we are here to help.  Please let us know if there is anything we can do to help you meet your goal of quitting. If you are a former smoker, congratulations. We are proud of you! Remain smoke free! Remember you can refer friends or family members through the number above.  We will screen them to make sure they meet criteria for the program. Thank you for helping us take better care of you by  participating in Lung Screening.  You can receive free nicotine replacement therapy ( patches, gum or mints) by calling 1-800-QUIT NOW. Please call so we can get you on the path to becoming  a non-smoker. I know it is hard, but you can do this!  Lung RADS Categories:  Lung RADS 1: no nodules or definitely non-concerning nodules.  Recommendation is for a repeat annual scan in 12 months.  Lung RADS 2:  nodules that are non-concerning in appearance and behavior with a very low likelihood of becoming an active cancer. Recommendation is for a repeat annual scan in 12 months.  Lung RADS 3: nodules that are probably non-concerning , includes nodules with a low likelihood of becoming an active cancer.  Recommendation is for a 6-month repeat screening scan. Often noted after an upper respiratory illness. We will be in touch to make sure you have no questions, and to schedule your 6-month scan.  Lung RADS 4 A: nodules with concerning findings, recommendation is most often for a follow up scan in 3 months or additional testing based on our provider's assessment of the scan. We will be in touch to make sure you have no questions and to schedule the recommended 3 month follow up scan.  Lung RADS 4 B:  indicates findings that are concerning. We will be in touch with you to schedule additional diagnostic testing based on our provider's  assessment of the scan.  Other options for assistance in smoking cessation (   As covered by your insurance benefits)  Hypnosis for smoking cessation  [employer] [phone]  Acupuncture for smoking cessation  [facility] [phone]   

## 2021-10-25 ENCOUNTER — Ambulatory Visit
Admission: RE | Admit: 2021-10-25 | Discharge: 2021-10-25 | Disposition: A | Discharge status: Home / Self Care | Payer: Medicare Other | Source: Ambulatory Visit | Attending: Acute Care | Admitting: Acute Care

## 2021-10-25 DIAGNOSIS — Z87891 Personal history of nicotine dependence: Secondary | ICD-10-CM

## 2021-10-25 DIAGNOSIS — Z122 Encounter for screening for malignant neoplasm of respiratory organs: Secondary | ICD-10-CM

## 2021-10-29 ENCOUNTER — Telehealth: Payer: Self-pay | Admitting: Acute Care

## 2021-10-29 DIAGNOSIS — Z87891 Personal history of nicotine dependence: Secondary | ICD-10-CM

## 2021-10-29 DIAGNOSIS — R911 Solitary pulmonary nodule: Secondary | ICD-10-CM

## 2021-10-29 NOTE — Telephone Encounter (Signed)
LDCT 10/25/21:  IMPRESSION: 1. Lung-RADS 4A, suspicious. Nodules at the peripheral right lung base may reflect atelectasis but require close attention. Follow up low-dose chest CT without contrast in 3 months (please use the following order, "CT CHEST LCS NODULE FOLLOW-UP W/O CM") is recommended. Alternatively, PET may be considered when there is a solid component 38m or larger. 2.  Emphysema (ICD10-J43.9) and Aortic Atherosclerosis (ICD10-170.0)   These results will be called to the ordering clinician or representative by the Radiologist Assistant, and communication documented in the PACS or [employer]     Electronically Signed   By: EMisty StanleyM.D.   On: 10/27/2021 17:00

## 2021-11-01 NOTE — Telephone Encounter (Signed)
I have called the patient with the results of her low dose CT. I expalined that her scan was read as a Lung RADS 4 A : suspicious findings, either short term follow up in 3 months or alternatively  PET Scan evaluation may be considered when there is a solid component of  8 mm or larger. She has Multiple bilateral pulmonary nodules,  dominant nodule measures 11 mm in the lateral right lung base (image 287). 6.5 mm left lower lobe nodule identified on image 209. Irregular 6 mm paraspinal left lower lobe nodule identified on image 275. I explained that she will need a 3 month follow up scan to re-evaluate these areas. She is in agreement with this plan.  I also told her there was an incidental finding of Coronary artery calcification . Mild atherosclerotic calcification was also  noted in the wall of the thoracic aorta.She was stared on statin medication by Dr. Haroldine Laws, but she states she has not started this because she wanted to try and get her cholesterol down with diet. We discussed that she should start this medication, ands follow up with cards if she has any issues with it, so they can try a different drug in this same drug class.   Langley Gauss, please fax results to PCP, let her know plan is for follow up scan, and order 3 month follow up low dose CT Chest ( Due after 9/10). Thanks so much  Dr. Haroldine Laws, just as an FYI . I told Jeweline I would let you know she was going to start her statin.

## 2021-11-01 NOTE — Telephone Encounter (Signed)
CT results faxed to PCP with follow up plans included. Order placed for 3 mth nodule f/u low dose CT.  

## 2021-11-16 ENCOUNTER — Ambulatory Visit: Payer: Medicare Other | Attending: Internal Medicine

## 2021-11-16 DIAGNOSIS — Z23 Encounter for immunization: Secondary | ICD-10-CM

## 2021-11-16 NOTE — Progress Notes (Signed)
   Covid-19 Vaccination Clinic  Name:  Evelyn Hughes    MRN: [id number] DOB: 07-24-1955  11/16/2021  Evelyn Hughes was observed post Covid-19 immunization for 15 minutes without incident. She was provided with Vaccine Information Sheet and instruction to access the V-Safe system.   Evelyn Hughes was instructed to call 911 with any severe reactions post vaccine: Difficulty breathing  Swelling of face and throat  A fast heartbeat  A bad rash all over body  Dizziness and weakness   Immunizations Administered     Name Date Dose VIS Date Route   Pfizer Covid-19 Vaccine Bivalent Booster 11/16/2021  1:44 PM 0.3 mL 01/17/2021 Intramuscular   Manufacturer: [place]   Lot: [id number]   Yell: [phone]

## 2021-11-26 ENCOUNTER — Telehealth: Payer: Self-pay | Admitting: Internal Medicine

## 2021-11-26 DIAGNOSIS — J449 Chronic obstructive pulmonary disease, unspecified: Secondary | ICD-10-CM

## 2021-11-26 DIAGNOSIS — J9611 Chronic respiratory failure with hypoxia: Secondary | ICD-10-CM

## 2021-11-27 NOTE — Telephone Encounter (Signed)
Called and spoke with pt letting her know the info from [employer] and [facility] and she verbalized understanding. Order placed for ONO. Nothing further needed.

## 2021-11-27 NOTE — Telephone Encounter (Signed)
Called and spoke with pt to see if she ever had an ONO performed as I was not seeing any results about this. Pt said she has never had an ONO performed. States she was in the hospital May 2020 and when she was in the hospital is when she required O2 and was then discharged home with it.  At pt's OV with Dr. Melvyn Novas 10/2018, pt was walked and did not desaturate but Dr. Melvyn Novas told pt to continue to wear O2 at night.  Pt stated that [employer] is having problems with Medicare paying for pt's O2. Called and spoke with Andee Poles from [facility] who states that pt needs to be recertified for her O2.  Dr. Melvyn Novas, please advise if you are okay with [place] ordering an ONO on pt to make sure she still requires O2 at night.

## 2021-11-27 NOTE — Telephone Encounter (Signed)
Needs ono RA

## 2021-11-29 ENCOUNTER — Encounter: Payer: Medicare Other | Admitting: Pulmonary Disease

## 2021-11-29 DIAGNOSIS — Z006 Encounter for examination for normal comparison and control in clinical research program: Secondary | ICD-10-CM

## 2021-11-29 DIAGNOSIS — R911 Solitary pulmonary nodule: Secondary | ICD-10-CM

## 2021-11-29 NOTE — Research (Signed)
Title: NIGHTINGALE: CliNIcal Utility of ManaGement of Patients witH CT and LDCT Identified Pulmonary Nodules UsinG the Percepta NasAL Swab ClassifiEr -- with Familiarization   Protocol #: [id number] Sponsor: [employer]   Protocol Revision 1 dated 01Sep2022 and confirmed current on today's visit, IRB approved Revision 1 on 29Dec2022.   Objectives:  Primary: To evaluate if use of the Percepta Nasal Swab test in the diagnostic work up of newly identified pulmonary nodules reduces the number of invasive procedures in the group classified as low-risk by the test and that are benign as compared to a control group managed without a Percepta Nasal Swab test result.                   A newly identified nodule is defined as any nodule first identified on imaging                   <90 days prior to nasal sample collection that hasn't undergone a diagnostic                    procedure for the management of their index nodule prior to enrollment.                   CT imaging includes conventional CT, LDCT, HRCT                   Benign diagnosis is defined as a specific diagnosis of a benign condition,                    radiographic resolution or stability at ? 24 months, or no cytological,                    radiological, or pathological evidence of cancer.                   Procedures will be categorized as either invasive or non-invasive in the Data                    Management Plan (DMP). Secondary: To evaluate if use of the Percepta Nasal Swab test in the diagnostic work up of newly identified pulmonary nodules increases the proportion of subjects classified as high-risk by the test and have primary lung cancer that go directly to appropriate therapy as compared to a control group managed without a Percepta Nasal Swab test result.                    Proportion of subjects that go directly to appropriate therapy is defined as those                     subjects that undergo surgery, ablative  or other appropriate therapy as the next                     step after the Percepta Nasal Swab test result without intervening non-surgical                     procedures                             a. Non-surgical procedures include diagnostic PET, but not PET for                                   staging purposes.                             b. Appropriate therapies will be defined in the CRF.                    A newly identified nodule is defined as any nodule first identified on imaging                    <90 days prior to nasal sample collection.                    Lung cancer diagnosis is defined as established by cytology or pathology, or in                     circumstances where a presumptive diagnosis of cancer led to definitive                     ablative or other appropriate therapy without pathology. Key Inclusion Criteria:  Inclusion Criteria:  Able to tolerate nasal epithelial specimen collection  Signed written Informed Consent obtained  Subject clinical history available for review by sponsor and regulatory agencies  New nodule first identified on imaging < 90 days prior to nasal sample collection (index nodule)  CT report available for index nodule  66 - 66 years of age  Current or former smoker (>100 cigarettes in a lifetime)  Pulmonary nodule ?30 mm detected by CT  Key Exclusion Criteria: Exclusion Criteria  Subject has undergone a diagnostic procedure for the management of their index nodule after the index CT and prior to enrollment  Active cancer (other than non-melanoma skin cancer)  Prior primary lung cancer (prior non-lung cancer acceptable)  Prior participation in this study (i.e., subjects may not be enrolled more than once)  Current active treatment with an investigational device or drug (patients in trial follow up period are okay if intervention phase is complete)  Patient enrolled or planned to be enrolled in another clinical trial that may influence  management of the patient's nodule  Concurrent or planned use of tools or tests for assigning lung nodule risk of malignancy (e.g., genomic or proteomic blood tests) other than clinically validated risk calculators  Clinical Research Coordinator / Research RN note : This visit is for baseline/enrollment Subject [id number] with DOB: [id number] on 13Jul2023 for the above protocol is an Enrollment Visit and is for purpose of research.    Subject expressed interest and consent in continuing as a study subject. Subject confirmed contact information (e.g. address, telephone, email). Subject thanked for participation in research and contribution to science.     During this visit on 13Jul2023, the subject reviewed and signed the consent form, provided demographics, and had a nasal swab collected per the above referenced protocol. Please refer to the subject's paper source binder for further details.   The PI and Sub-I met/discussed the subject prior to consenting patient.  The sub-Investigator  Dr. Baltazar Apo was present for the consenting process.         Signed by Hollace Kinnier

## 2021-12-10 ENCOUNTER — Other Ambulatory Visit (HOSPITAL_BASED_OUTPATIENT_CLINIC_OR_DEPARTMENT_OTHER): Payer: Self-pay

## 2021-12-10 MED ORDER — PFIZER COVID-19 VAC BIVALENT 30 MCG/0.3ML IM SUSP
INTRAMUSCULAR | 0 refills | Status: DC
  Filled 2021-12-10: qty 0.3, 1d supply, fill #0

## 2021-12-18 ENCOUNTER — Telehealth: Payer: Self-pay

## 2021-12-18 DIAGNOSIS — G4734 Idiopathic sleep related nonobstructive alveolar hypoventilation: Secondary | ICD-10-CM

## 2021-12-18 NOTE — Telephone Encounter (Signed)
-    ONO on RA 12/11/21  desat < 89 x 7 h 9 min so 12/18/2021 rec 2lpm and repeat ONO on 2lpm   ATC patient to notify of results. LVM to call back.

## 2021-12-18 NOTE — Telephone Encounter (Signed)
Called and spoke to patient and she voiced understanding. She states she normally uses 2LO2 at night time. She expressed understanding that the repeated ONO test will be with 2LO2. Nothing further needed. Order placed.

## 2022-01-17 ENCOUNTER — Other Ambulatory Visit (HOSPITAL_COMMUNITY): Payer: Self-pay

## 2022-01-17 DIAGNOSIS — I5032 Chronic diastolic (congestive) heart failure: Secondary | ICD-10-CM

## 2022-02-04 ENCOUNTER — Telehealth: Payer: Self-pay | Admitting: Internal Medicine

## 2022-02-06 NOTE — Telephone Encounter (Signed)
Called and went over ONO results and she verbalized understanding. Nothing further needed

## 2022-02-06 NOTE — Telephone Encounter (Signed)
-    ONO on RA 12/11/21  desat < 89%  x 7 h 9 min so 12/18/2021 rec 2lpm and repeat ONO on 2lpm done 12/25/21 no desats on 2lpm so rec continue 2lpm hs

## 2022-02-06 NOTE — Telephone Encounter (Signed)
Called patient and she was wanting the results of her ONO. I see in chart were the first ONO was on room air and then the last ONO was on 2L. Sir are you wanting patient to stay on 2L of oxygen.   I see the second ONO was scanned in but want to make sure you want her to stay on 2L.   Patient will drop [employer] form off at office.   Please advise sir

## 2022-02-13 ENCOUNTER — Telehealth: Payer: Self-pay

## 2022-02-13 NOTE — Telephone Encounter (Signed)
Left VM for patient and call back number to schedule follow up LCS CT for nodule

## 2022-02-14 NOTE — Telephone Encounter (Signed)
Reminder letter mailed for patient to call to schedule follow up nodule LDCT

## 2022-03-07 NOTE — Telephone Encounter (Signed)
Left VM for pt to call back to schedule follow up lung screening CT.

## 2022-03-11 ENCOUNTER — Other Ambulatory Visit: Payer: Self-pay | Admitting: *Deleted

## 2022-03-11 DIAGNOSIS — R911 Solitary pulmonary nodule: Secondary | ICD-10-CM

## 2022-03-11 DIAGNOSIS — Z87891 Personal history of nicotine dependence: Secondary | ICD-10-CM

## 2022-03-26 ENCOUNTER — Other Ambulatory Visit (HOSPITAL_COMMUNITY): Payer: Self-pay | Admitting: Internal Medicine

## 2022-03-26 DIAGNOSIS — I509 Heart failure, unspecified: Secondary | ICD-10-CM

## 2022-03-28 ENCOUNTER — Ambulatory Visit
Admission: RE | Admit: 2022-03-28 | Discharge: 2022-03-28 | Disposition: A | Discharge status: Home / Self Care | Payer: Medicare Other | Source: Ambulatory Visit | Attending: Acute Care | Admitting: Acute Care

## 2022-03-28 DIAGNOSIS — R911 Solitary pulmonary nodule: Secondary | ICD-10-CM

## 2022-03-28 DIAGNOSIS — Z87891 Personal history of nicotine dependence: Secondary | ICD-10-CM

## 2022-03-31 ENCOUNTER — Other Ambulatory Visit (HOSPITAL_COMMUNITY): Payer: Self-pay | Admitting: Internal Medicine

## 2022-03-31 DIAGNOSIS — I509 Heart failure, unspecified: Secondary | ICD-10-CM

## 2022-04-17 ENCOUNTER — Telehealth: Payer: Self-pay | Admitting: Acute Care

## 2022-04-17 DIAGNOSIS — Z87891 Personal history of nicotine dependence: Secondary | ICD-10-CM

## 2022-04-17 DIAGNOSIS — R911 Solitary pulmonary nodule: Secondary | ICD-10-CM

## 2022-04-17 NOTE — Telephone Encounter (Signed)
Results faxed to PCP with plan for 6 month nodule LDCT follow up.  New order placed for LDCT for nodule

## 2022-04-17 NOTE — Telephone Encounter (Signed)
I have called the patient with the results of her low-dose screening CT.  I explained that while her scan was read as a lung RADS 2>>7-monthfollow-up,  based on my conversation with Dr. RBaltazar Apo  Pulmonologist, we feel it is better to do a 641-monthollow-up .  The rteason being that there has been interval growth of 2 nodules in a 5 month period of time. Upon reviewing patient's scans she had a scan done in June 2023 that was read as a lung RADS 4A.  Patient should have had a 3-78-monthllow-up however follow-up was done in November,  so 5-m58-monthlow-up.( Unsure why this was delayed) There has been some growth to 2 of the nodules noted in the June scan.  Specifically there has been growth of 1 nodule from 11.3 mm to 11.6 mm, and growth of a 6.4 mm nodule to a 7.3 mm nodule. Because there has been growth in a 5-mo54-monthrval Dr. ByrumLamonte SakaiI agree that patient should have a 6-mon37-monthw-up screening CT to reevaluate for stability. Patient quit smoking Sep 21, 2018. She is in agreement with the plan.  I did explain that once the scan is ordered and precertified we could let them know if there is additional cost to her.  She does understand that we recommend a 6-mont70-month-up and I have explained that we will send the results to her primary care doctor also. Denise please place order for 78-month378-monthup low-dose CT,  and fax results to PCP, let him know plan is for 78-month 22-monthp low-dose CT. Thanks so much

## 2022-04-23 ENCOUNTER — Telehealth: Payer: Self-pay | Admitting: Internal Medicine

## 2022-04-24 ENCOUNTER — Other Ambulatory Visit: Payer: Self-pay

## 2022-04-24 ENCOUNTER — Ambulatory Visit (HOSPITAL_COMMUNITY)
Admission: RE | Admit: 2022-04-24 | Discharge: 2022-04-24 | Disposition: A | Discharge status: Home / Self Care | Payer: Medicare Other | Source: Ambulatory Visit | Attending: Family Medicine | Admitting: Family Medicine

## 2022-04-24 ENCOUNTER — Ambulatory Visit (HOSPITAL_BASED_OUTPATIENT_CLINIC_OR_DEPARTMENT_OTHER)
Admission: RE | Admit: 2022-04-24 | Discharge: 2022-04-24 | Disposition: A | Discharge status: Home / Self Care | Payer: Medicare Other | Source: Ambulatory Visit | Attending: Internal Medicine | Admitting: Internal Medicine

## 2022-04-24 ENCOUNTER — Encounter (HOSPITAL_COMMUNITY): Payer: Self-pay | Admitting: Internal Medicine

## 2022-04-24 VITALS — BP 98/60 | HR 108 | Wt 84.2 lb

## 2022-04-24 DIAGNOSIS — Z79899 Other long term (current) drug therapy: Secondary | ICD-10-CM | POA: Insufficient documentation

## 2022-04-24 DIAGNOSIS — E785 Hyperlipidemia, unspecified: Secondary | ICD-10-CM | POA: Diagnosis not present

## 2022-04-24 DIAGNOSIS — R64 Cachexia: Secondary | ICD-10-CM | POA: Insufficient documentation

## 2022-04-24 DIAGNOSIS — J9610 Chronic respiratory failure, unspecified whether with hypoxia or hypercapnia: Secondary | ICD-10-CM | POA: Diagnosis present

## 2022-04-24 DIAGNOSIS — I5032 Chronic diastolic (congestive) heart failure: Secondary | ICD-10-CM | POA: Insufficient documentation

## 2022-04-24 DIAGNOSIS — J449 Chronic obstructive pulmonary disease, unspecified: Secondary | ICD-10-CM | POA: Diagnosis present

## 2022-04-24 DIAGNOSIS — Z880 Allergy status to penicillin: Secondary | ICD-10-CM | POA: Diagnosis not present

## 2022-04-24 DIAGNOSIS — Z882 Allergy status to sulfonamides status: Secondary | ICD-10-CM | POA: Diagnosis not present

## 2022-04-24 DIAGNOSIS — Z87891 Personal history of nicotine dependence: Secondary | ICD-10-CM | POA: Diagnosis present

## 2022-04-24 DIAGNOSIS — I272 Pulmonary hypertension, unspecified: Secondary | ICD-10-CM | POA: Insufficient documentation

## 2022-04-24 LAB — COMPREHENSIVE METABOLIC PANEL
ALT: 19 U/L (ref 0–44)
AST: 22 U/L (ref 15–41)
Albumin: 4.2 g/dL (ref 3.5–5.0)
Alkaline Phosphatase: 61 U/L (ref 38–126)
Anion gap: 10 (ref 5–15)
BUN: 17 mg/dL (ref 8–23)
CO2: 28 mmol/L (ref 22–32)
Calcium: 9.5 mg/dL (ref 8.9–10.3)
Chloride: 102 mmol/L (ref 98–111)
Creatinine, Ser: 0.67 mg/dL (ref 0.44–1.00)
GFR, Estimated: >60 mL/min (ref >60)
Glucose, Bld: 66 mg/dL — ABNORMAL LOW (ref 70–99)
Potassium: 4.1 mmol/L (ref 3.5–5.1)
Sodium: 140 mmol/L (ref 135–145)
Total Bilirubin: 0.7 mg/dL (ref 0.3–1.2)
Total Protein: 6.4 g/dL — ABNORMAL LOW (ref 6.5–8.1)

## 2022-04-24 LAB — LIPID PANEL
Cholesterol: 170 mg/dL (ref 0–200)
HDL: 79 mg/dL (ref >40)
LDL Cholesterol: 81 mg/dL (ref 0–99)
Total CHOL/HDL Ratio: 2.2 RATIO
Triglycerides: 50 mg/dL (ref <150)
VLDL: 10 mg/dL (ref 0–40)

## 2022-04-24 LAB — ECHOCARDIOGRAM COMPLETE
Area-P 1/2: 3.39 cm2
S' Lateral: 2.6 cm

## 2022-04-24 LAB — BRAIN NATRIURETIC PEPTIDE: B Natriuretic Peptide: 72.5 pg/mL (ref 0.0–100.0)

## 2022-04-24 NOTE — Telephone Encounter (Signed)
Called and spoke with Brad with [employer], he did not have a patient listed by this patient's name with any equipment from [place].  I did not see a referral to any DME from our office recently.  From Dr. Gustavus Bryant last office note, she has nocturnal oxygen.  ATC patient x1 to verify oxygen company since [place] [facility] did not have her listed with [employer].  LVM to return call.

## 2022-04-24 NOTE — Addendum Note (Signed)
Encounter addended by: Kerry Dory, CMA on: 04/24/2022 1:00 PM  Actions taken: Order list changed, Diagnosis association updated, Clinical Note Signed, Charge Capture section accepted

## 2022-04-24 NOTE — Progress Notes (Signed)
Advanced Heart Failure Clinic Note   Date:  04/24/2022   ID:  Evelyn Hughes, DOB [birth year], MRN [id number]  Location: Home  Provider location: [place] [facility] Type of Visit: Established patient   PCP:  Antony Blackbird, MD  Cardiologist:  None Primary HF: Dr Haroldine Laws   Chief Complaint: Heart Failure   History of Present Illness: Evelyn Hughes is a 66 y.o. female with a history of tobacco abuse, COPD, pulmonary HTN, and chronic respiratory failure.    Admitted to [facility] 09/27/2018 with AMS found to be hypoxic on arrival requiring intubation. She was later extubated on 09/29/2018. Placed on IV lasix due to leg edema. Once diuresed, RHC was performed. RHC showed minimally elevated PA pressures and mildly elevated cardiac output. She was discharged on lasix 20 mg daily. Discharge weight was 111 pounds.   She presents for routine f/u. Here with [name]. Has lost more weight 100 -> 94 -> 80 lbs. Feels ok. Denies SOB, orthopnea or PND. Uses O2 at night (follows with Dr Melvyn Novas)   Echo today 04/25/22 EF 55-60% RV ok   Echo 9/21 EF 45-50% (I thought 50-55%) wit basilar to mid HK of inferior I nferior lateral wall.  ECHO 09/22/2018 LVEF 55-60%, RV normal  PFTs FEV1 1.01 (37%) FVC   2.17 (62% FEF 25-75 0.36 DLCO 57%  RHC 10/05/18  RA = 1 ( v waves to 10 due to TR) RV = 32/4 PA = 35/11 (24) PCW = 10 Fick cardiac output/index = 6.42/4.15 PVR = 2.2 WU Ao sat = 99% PA sat =79%, 80% SVC = 79%     Past Medical History:  Diagnosis Date   Allergy    Past Surgical History:  Procedure Laterality Date   RIGHT HEART CATH N/A 10/05/2018   Procedure: RIGHT HEART CATH;  Surgeon: Jolaine Artist, MD;  Location: [facility];  Service: Cardiovascular;  Laterality: N/A;     Current Outpatient Medications  Medication Sig Dispense Refill   albuterol (VENTOLIN HFA) 108 (90 Base) MCG/ACT inhaler Inhale 2 puffs into the lungs every 6 (six) hours as needed for  wheezing or shortness of breath. 1 Inhaler 2   aspirin 81 MG chewable tablet Chew 81 mg by mouth every other day.     aspirin EC 81 MG tablet Take 1 tablet (81 mg total) by mouth daily. Swallow whole. 90 tablet 3   furosemide (LASIX) 20 MG tablet TAKE 1 TABLET BY MOUTH EVERY OTHER DAY 45 tablet 11   OXYGEN 1-2 l pm oxygen depending on level of activity     rosuvastatin (CRESTOR) 5 MG tablet Take 1 tablet (5 mg total) by mouth daily. 90 tablet 3   spironolactone (ALDACTONE) 25 MG tablet Take 1 tablet by mouth once daily 90 tablet 0   Tiotropium Bromide-Olodaterol (STIOLTO RESPIMAT) 2.5-2.5 MCG/ACT AERS INHALE 2 PUFFS BY MOUTH ONCE DAILY 4 g 11   No current facility-administered medications for this encounter.    Allergies:   Shellfish allergy, Expectorant cough control [guaifenesin], Sulfa antibiotics, and Penicillins   Social History:  The patient  reports that she quit smoking about 3 years ago. Her smoking use included cigarettes. She has a 96.00 pack-year smoking history. She has never used smokeless tobacco. She reports that she does not drink alcohol and does not use drugs.   Family History:  The patient's family history includes Cancer in her mother.   ROS:  Please see the history of  present illness.   All other systems are personally reviewed and negative.   Exam:   General:  Thin. Well appearing. No resp difficulty HEENT: normal Neck: supple. no JVD. Carotids 2+ bilat; no bruits. No lymphadenopathy or thryomegaly appreciated. Cor: PMI nondisplaced. Regular rate & rhythm. No rubs, gallops or murmurs. Lungs: decreased throughout Abdomen: soft, nontender, nondistended. No hepatosplenomegaly. No bruits or masses. Good bowel sounds. Extremities: no cyanosis, clubbing, rash, edema Neuro: alert & orientedx3, cranial nerves grossly intact. moves all 4 extremities w/o difficulty. Affect pleasant  NSR 97 RAD No ST-T wave abnormalities. Personally reviewed   Recent Labs: No results  found for requested labs within last 365 days.  Personally reviewed   Wt Readings from Last 3 Encounters:  04/24/22 38.2 kg (84 lb 3.2 oz)  07/18/21 41.8 kg (92 lb 3.2 oz)  04/10/21 42.7 kg (94 lb 3.2 oz)    NSR 94 RAE Personally reviewed  ASSESSMENT AND PLAN:  1. 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 LE u/s negative for DVT - Much improved with diuresis and O2. Only using O2 at night - stable NYHA II-III - 10/03/2018 VQ negative.  - RHC 5/20  Minimally elevated PA pressures and PVR 2.2.  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 No significant PAH  - Echo today RV normal. No evidence of PAH   2. Chronic respiratory failure due to advanced COPD - Wears O2 at night and as needed - Uses pulse ox to ensure sat > 90% - Follows with Dr. Melvyn Novas - No change   3. Chronic diastolic HF - NYHA II-III but likely not due to HF. Volume ok - Echo 9/21 EF 50% mild inferior WMA - Echo today 04/24/22 EF 55-60% Personally reviewed - Continue 25 mg spironolactone daily.    4. Coronary calcium on CT - Previous echo with mild inferior WMA. Now resolved - No s/s angina - On ASA and low-dose statin. Last LDL 11/22 LDL 142  - No role for cath as she is not surgical candidate and not having angina - repeat labs/lipids today   4. 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 Cachexia - encourged her to increase food/fluid intake - no salt restriction    Signed, Glori Bickers, MD  04/24/2022 12:43 PM  [facility] [address] [facility] [place] [address] [phone] (office) [phone] (fax)

## 2022-04-24 NOTE — Telephone Encounter (Signed)
Patient called to speak with [facility].  She stated that her [employer] pt. # is [id number]. Please call patient to discuss.

## 2022-04-24 NOTE — Telephone Encounter (Signed)
                        Patient checking on message for bill for [place]. May leave detailed message on voicemail. Patient phone number is [phone].

## 2022-04-24 NOTE — Progress Notes (Signed)
Echocardiogram 2D Echocardiogram has been performed.  Evelyn Hughes M 04/24/2022, 11:51 AM

## 2022-04-24 NOTE — Patient Instructions (Signed)
It was great to see you today! No medication changes are needed at this time.  Labs today We will only contact you if something comes back abnormal or we need to make some changes. Otherwise no news is good news!  Your physician wants you to follow-up in: 12 months with Dr Haroldine Laws. You will receive a reminder letter in the mail two months in advance. If you don't receive a letter, please call our office to schedule the follow-up appointment.  Do the following things EVERYDAY: Weigh yourself in the morning before breakfast. Write it down and keep it in a log. Take your medicines as prescribed Eat low salt foods--Limit salt (sodium) to 2000 mg per day.  Stay as active as you can everyday Limit all fluids for the day to less than 2 liters  At the [facility], you and your health needs are our priority. As part of our continuing mission to provide you with exceptional heart care, we have created designated Provider Care Teams. These Care Teams include your primary Cardiologist (physician) and Advanced Practice Providers (APPs- Physician Assistants and Nurse Practitioners) who all work together to provide you with the care you need, when you need it.   You may see any of the following providers on your designated Care Team at your next follow up: Dr Glori Bickers Dr Loralie Champagne Dr. Roxana Hires, NP Lyda Jester, [place] [facility] [place], [place] Forestine Na, NP Audry Riles, PharmD   Please be sure to bring in all your medications bottles to every appointment.

## 2022-04-24 NOTE — Telephone Encounter (Signed)
Called and spoke with patient, she states she received a letter from [employer] stating that they have been trying reach her to resolve the billing issues and have been unable to reach her by phone.  It went on to say that they are now billing her for the total cost of the equipment she has been renting (home refill and oxygen concentrator).  She says they have not been trying to reach her by phone, that is a lie.  The letter stated that if they did not hear from her or receive payment, they would excalate the matter.  She called [phone] and spoke with Gerald Stabs who "chewed her out" and was very rude to her and told her that they would pick up the equipment if the issue was not resolved.  She was told to get an appointment to see her her doctor and to get a new doctor.  When she asked to speak with a supervisor, he asked why?, they are going to tell you exactly the same thing I am telling you.  Her account # is [id number].  I let her know that I would contact a leader at [employer] to assist with getting this issues resolved and have them reach out to her.  She does have an appointment to have an echo and then an OV with her doctor.  She will likely not be back home and available until this afternoon.  Called and spoke with Melissa with [employer], provided the details of the issue, she stated that she had been made aware of the billing situation and she is new to medicare so she would need a face to face visit, new orders with sats and discussion of need for oxygen.  Melissa said she would try to call her later and follow up on the rudeness she encountered with Gerald Stabs.  I told Melissa I would contact the patient to schedule a face to face visit and get the documentation needed to continue her oxygen.  ATC patient x1.  LVM to return call.  When she calls back, please schedule her for an OV.  She is a new medicare patient and needs a face to face office visit with the provider, a walk test (to re qualify for oxygen) and  will need a new order for oxygen at her OV.  Will await return call.

## 2022-05-01 ENCOUNTER — Encounter: Payer: Self-pay | Admitting: Primary Care

## 2022-05-01 ENCOUNTER — Ambulatory Visit (INDEPENDENT_AMBULATORY_CARE_PROVIDER_SITE_OTHER): Payer: Medicare Other | Admitting: Primary Care

## 2022-05-01 ENCOUNTER — Telehealth: Payer: Self-pay | Admitting: *Deleted

## 2022-05-01 VITALS — BP 98/62 | HR 105 | Temp 98.2°F | Ht 65.0 in | Wt 85.0 lb

## 2022-05-01 DIAGNOSIS — J449 Chronic obstructive pulmonary disease, unspecified: Secondary | ICD-10-CM

## 2022-05-01 DIAGNOSIS — G4734 Idiopathic sleep related nonobstructive alveolar hypoventilation: Secondary | ICD-10-CM | POA: Diagnosis not present

## 2022-05-01 DIAGNOSIS — J9611 Chronic respiratory failure with hypoxia: Secondary | ICD-10-CM | POA: Diagnosis not present

## 2022-05-01 NOTE — Patient Instructions (Signed)
We have reached out to [employer] to notify them that you were seen today/ renewing oxygen order and for someone to help with billing issue  We should not need any additional testing right now  Form filled out for [facility], we will fax and have given you a copy   Recommendations: Continue to wear 2L oxygen at bedtime  Follow-up: 3 months with Dr. Melvyn Novas or sooner if needed    Home Oxygen Use, Adult When a medical condition keeps you from getting enough oxygen, your health care provider may instruct you to take extra oxygen at home. Your health care provider will let you know: When to take oxygen. How long to take oxygen. How quickly oxygen should be delivered (flow rate), in liters per minute (LPM or L/M). Home oxygen can be given through: A mask. A nasal cannula. This is a device or tube that goes in the nostrils. A transtracheal catheter. This is a small, thin tube placed in the windpipe (trachea). A breathing tube (tracheostomy tube) that is surgically placed in the windpipe. This may be used in severe cases. These devices are connected with tubing to an oxygen source, such as: A tank. Tanks hold oxygen in gas form. They must be replaced when the oxygen is used up. A liquid oxygen device. This holds oxygen in liquid form. Liquid oxygen is very cold. It must be replaced when the oxygen is used up. An oxygen concentrator machine. This filters oxygen in the room. There are two types of oxygen concentrator machines--stationary and portable. A stationary oxygen concentrator machine plugs into the main electricity supply at your home. You must have a backup cylinder of oxygen in case the power goes out. A portable oxygen concentrator machine is smaller in size and more lightweight. This machine uses battery supply and can be used outside the home. Work with your health care provider to find equipment that works best for you and your lifestyle. What are the risks? Delivery of supplemental oxygen is  generally safe. However, some risks include: Fire. This can happen if the oxygen is exposed to a heat source, flame, or spark. Injury to skin. This can happen if liquid oxygen touches your skin. Damage to the lungs or other organs. This can happen from getting too little or too much oxygen. Supplies needed: To use oxygen, you will need: A mask, nasal cannula, transtracheal catheter, or tracheostomy. An oxygen tank, a liquid oxygen device, or an oxygen concentrator. The tape that your health care provider recommends (optional). Your health care provider may also recommend: A humidifier to warm and moisten the oxygen delivered. This will depend on how much oxygen you need and the type of home oxygen device you use. A pulse oximeter. This device measures the percentage of oxygen in your blood. How to use oxygen Your health care provider or a person from your [place] will show you how to use your oxygen device. Follow his or her instructions. The instructions may look something like this: Wash your hands with soap and water. If you use an oxygen concentrator, make sure it is plugged in. Place one end of the tube into the port on the tank, device, or machine. Place the mask over your nose and mouth. Or, place the nasal cannula and secure it with tape if instructed. If you use a tracheostomy or transtracheal catheter, connect it to the oxygen source as directed. Make sure the liter-flow setting on the machine is at the level prescribed by your health  care provider. Turn on the machine or adjust the knob on the tank or device to the correct liter-flow setting. When you are done, turn off and unplug the machine, or turn the knob to OFF. How to clean and care for the oxygen supplies Nasal cannula Clean it with a warm, wet cloth daily or as needed. Wash it with a liquid soap once a week. Rinse it thoroughly once or twice a week. Air-dry it. Replace it every 2-4 weeks. If you have an  infection, such as a cold or pneumonia, change the cannula when you get better. Mask Replace it every 2-4 weeks. If you have an infection, such as a cold or pneumonia, change the mask when you get better. Humidifier bottle Wash the bottle between each refill: Wash it with soap and warm water. Rinse it thoroughly. Clean it and its top with a disinfectant cleaner. Air-dry it. Make sure it is dry before you refill it. Oxygen concentrator Clean the air filter at least twice a week according to directions from your home medical equipment and service company. Wipe down the cabinet every day. To do this: Unplug the unit. Wipe down the cabinet with a damp cloth. Dry the cabinet. Other equipment Change any extra tubing every 1-3 months. Follow instructions from your health care provider about taking care of any other equipment. Safety tips Fire safety tips  Keep your oxygen and oxygen supplies at least 6 ft (2 m) away from sources of heat, flames, and sparks at all times. Do not allow smoking near your oxygen. Put up "no smoking" signs in your home. Avoid smoking areas when in public. Do not use materials that can burn (are flammable) while you use oxygen. This includes: Petroleum jelly. Hair spray or other aerosol sprays. Rubbing alcohol. Hand sanitizer. When you go to a restaurant with portable oxygen, ask to be seated in the non-smoking section. Keep a Data processing manager close by. Let your fire department know that you have oxygen in your home. Test your home smoke detectors regularly. Traveling Secure your oxygen tank in the vehicle so that it does not move around. Follow instructions from your medical device company about how to safely secure your tank. Make sure you have enough oxygen for the amount of time you will be away from home. If you are planning to travel by public transportation (airplane, train, bus, or boat), contact the company to find out if it allows the use of an  approved portable oxygen concentrator. You may also need documents from your health care provider and medical device company before you travel. General safety tips If you use an oxygen cylinder, make sure it is in a stand or secured to an object that will not move (fixed object). If you use liquid oxygen, make sure its container is kept upright at all times. If you use an oxygen concentrator: Tell Loss adjuster, chartered company. Make sure you are given priority service in the event that your power goes out. Avoid using extension cords if possible. Follow these instructions at home: Use oxygen only as told by your health care provider. Do not use alcohol or other drugs that make you relax (sedating drugs) unless instructed. They can slow down your breathing rate and make it hard to get in enough oxygen. Know how and when to order a refill of oxygen. Always keep a spare tank of oxygen. Plan ahead for holidays when you may not be able to get a prescription filled. Use water-based lubricants on your  lips or nostrils. Do not use oil-based products like petroleum jelly. To prevent skin irritation on your cheeks or behind your ears, tuck some gauze under the tubing. Where to find more information American Lung Association: DiabeticMale.de Contact a health care provider if: You get headaches often. You have a lasting cough. You are restless or have anxiety. You develop an illness that affects your breathing. You cannot exercise at your regular level. You have a fever. You have persistent redness under your nose. Get help right away if: You are confused. You are sleepy all the time. You have blue lips or fingernails. You have difficult or irregular breathing that is getting worse. You are struggling to breathe. These symptoms may represent a serious problem that is an emergency. Do not wait to see if the symptoms will go away. Get medical help right away. Call your local emergency services (911 in  the [place] Do not drive yourself to the hospital. Summary Your health care provider or a person from your [place] will show you how to use your oxygen device. Follow his or her instructions. If you use an oxygen concentrator, make sure it is plugged in. Make sure the liter-flow setting on the machine is at the level prescribed by your health care provider. Use oxygen only as told by your health care provider. Keep your oxygen and oxygen supplies at least 6 ft (2 m) away from sources of heat, flames, and sparks at all times. This information is not intended to replace advice given to you by your health care provider. Make sure you discuss any questions you have with your health care provider. Document Revised: 09/06/2021 Document Reviewed: 05/04/2019 Elsevier Patient Education  [place].

## 2022-05-01 NOTE — Assessment & Plan Note (Addendum)
-   Patient has been on oxygen since May 2020 and clinically benefits from use.  She had overnight oximetry test in July 2023 that showed she spent 7 hours with SpO2 < 88% on room air. She had a repeat ONO in August on 2 L that reveal no significant desaturations. No daytime requirements. She received a large bill from [employer] for oxygen. We have reached out to her DME company to get this straightened out.  We will place an order to renew oxygen.

## 2022-05-01 NOTE — Telephone Encounter (Signed)
ATC Brad with [employer] to make him aware that the patient was in the office stating that she was told that they were going to take her oxygen and charge her $3000.  She had an ONO in July of 2023 and she spent 7 hours <88%.  Requested a call back so we can get this resolved.

## 2022-05-01 NOTE — Progress Notes (Signed)
$'[name]$  ID: Evelyn Hughes, female    DOB: May 20, 1956, 66 y.o.   MRN: [id number]  Chief Complaint  Patient presents with   Follow-up    COPD  [employer] 02 2l at night     Referring provider: Antony Blackbird, MD  HPI: 66 year old female, former smoker quit in May 2020 (96-pack-year history).  Past medical history significant for COPD and chronic respiratory failure.  Patient of Dr. Melvyn Novas.  05/01/2022 Patient has hx COPD III. She wears oxygen at bedtime. She received a letter from [facility] stating they were trying to reach her over a billing issue and they were now going to be collecting her equipment and charging her in full for refill tanks and concentrator. She states that she was never contacted by [employer] by phone call. She was charged 3 thousand dollars. She was told that medicare needs new order for oxygen and face-to-faced visit with provider. She has been on oxygen since May 2020. Patient had overnight oximetry test in July 2023 on room air and again on 2L. She has been wearing 2L oxygen at night as prescribed. No daytime requirements. Breathing is baseline. No acute respiratory symptoms today.    Allergies  Allergen Reactions   Shellfish Allergy Anaphylaxis   Expectorant Cough Control [Guaifenesin]     Hypotension    Sulfa Antibiotics    Penicillins Rash   Pineapple Rash    Immunization History  Administered Date(s) Administered   Influenza Inj Mdck Quad Pf 02/15/2019   PFIZER Comirnaty(Gray Top)Covid-19 Tri-Sucrose Vaccine 09/11/2020   PFIZER(Purple Top)SARS-COV-2 Vaccination 08/12/2019, 09/06/2019, 03/18/2020   Pfizer Covid-19 Vaccine Bivalent Booster 59yr & up 03/27/2021, 11/16/2021    Past Medical History:  Diagnosis Date   Allergy     Tobacco History: Social History   Tobacco Use  Smoking Status Former   Packs/day: 2.00   Years: 48.00   Total pack years: 96.00   Types: Cigarettes   Quit date: 09/21/2018   Years since quitting: 3.6  Smokeless Tobacco Never    Counseling given: Not Answered   Outpatient Medications Prior to Visit  Medication Sig Dispense Refill   aspirin EC 81 MG tablet Take 1 tablet (81 mg total) by mouth daily. Swallow whole. (Patient taking differently: Take 81 mg by mouth daily. Swallow whole. Everyother day) 90 tablet 3   furosemide (LASIX) 20 MG tablet TAKE 1 TABLET BY MOUTH EVERY OTHER DAY 45 tablet 11   OXYGEN 1-2 l pm oxygen depending on level of activity     rosuvastatin (CRESTOR) 5 MG tablet Take 1 tablet (5 mg total) by mouth daily. 90 tablet 3   spironolactone (ALDACTONE) 25 MG tablet Take 1 tablet by mouth once daily 90 tablet 0   Tiotropium Bromide-Olodaterol (STIOLTO RESPIMAT) 2.5-2.5 MCG/ACT AERS INHALE 2 PUFFS BY MOUTH ONCE DAILY 4 g 11   albuterol (VENTOLIN HFA) 108 (90 Base) MCG/ACT inhaler Inhale 2 puffs into the lungs every 6 (six) hours as needed for wheezing or shortness of breath. (Patient not taking: Reported on 05/01/2022) 1 Inhaler 2   aspirin 81 MG chewable tablet Chew 81 mg by mouth every other day.     No facility-administered medications prior to visit.    Review of Systems  Review of Systems  Constitutional: Negative.   HENT: Negative.    Respiratory: Negative.  Negative for cough, chest tightness and wheezing.   Cardiovascular: Negative.     Physical Exam  BP 98/62 (BP Location: Left Arm, Patient Position: Sitting, Cuff Size: Normal)  Pulse (!) 105   Temp 98.2 F (36.8 C) (Oral)   Ht '5\' 5"'$  (1.651 m)   Wt 85 lb (38.6 kg)   SpO2 95%   BMI 14.14 kg/m  Physical Exam Constitutional:      General: She is not in acute distress.    Appearance: Normal appearance. She is not ill-appearing.  HENT:     Head: Normocephalic and atraumatic.     Mouth/Throat:     Mouth: Mucous membranes are moist.     Pharynx: Oropharynx is clear.  Cardiovascular:     Rate and Rhythm: Normal rate and regular rhythm.  Pulmonary:     Effort: Pulmonary effort is normal.     Breath sounds: Normal breath  sounds.  Musculoskeletal:        General: Normal range of motion.  Skin:    General: Skin is warm.  Neurological:     General: No focal deficit present.     Mental Status: She is alert and oriented to person, place, and time. Mental status is at baseline.  Psychiatric:        Mood and Affect: Mood normal.        Behavior: Behavior normal.        Thought Content: Thought content normal.        Judgment: Judgment normal.      Lab Results:  CBC    Component Value Date/Time   WBC 9.5 04/10/2021 1227   RBC 4.69 04/10/2021 1227   HGB 14.0 04/10/2021 1227   HGB 13.3 11/04/2018 0937   HCT 43.5 04/10/2021 1227   HCT 42.0 11/04/2018 0937   PLT 389 04/10/2021 1227   PLT 433 11/04/2018 0937   MCV 92.8 04/10/2021 1227   MCV 85 11/04/2018 0937   MCH 29.9 04/10/2021 1227   MCHC 32.2 04/10/2021 1227   RDW 12.5 04/10/2021 1227   RDW 15.8 (H) 11/04/2018 0937   LYMPHSABS 1.9 11/04/2018 0937   MONOABS 1.9 (H) 10/06/2018 0506   EOSABS 0.4 11/04/2018 0937   BASOSABS 0.1 11/04/2018 0937    BMET    Component Value Date/Time   NA 140 04/24/2022 1300   NA 136 10/08/2018 1103   K 4.1 04/24/2022 1300   CL 102 04/24/2022 1300   CO2 28 04/24/2022 1300   GLUCOSE 66 (L) 04/24/2022 1300   BUN 17 04/24/2022 1300   BUN 17 10/08/2018 1103   CREATININE 0.67 04/24/2022 1300   CREATININE 0.53 03/23/2014 1139   CALCIUM 9.5 04/24/2022 1300   GFRNONAA >60 04/24/2022 1300   GFRAA >60 02/10/2019 1157    BNP    Component Value Date/Time   BNP 72.5 04/24/2022 1300    ProBNP No results found for: "PROBNP"  Imaging: ECHOCARDIOGRAM COMPLETE  Result Date: 04/24/2022    ECHOCARDIOGRAM REPORT   Patient Name:   Evelyn Hughes Date of Exam: 04/24/2022 Medical Rec #:  [id number]       Height:       65.0 in Accession #:    [id number]      Weight:       92.2 lb Date of Birth:  Jan 10, 1956       BSA:          1.423 m Patient Age:    67 years        BP:           132/87 mmHg Patient Gender: F  HR:           89 bpm. Exam Location:  Outpatient Procedure: 2D Echo, Cardiac Doppler and Color Doppler Indications:    Congestive Heart Failure I50.9  History:        Patient has prior history of Echocardiogram examinations, most                 recent 02/14/2020. Risk Factors:Former Smoker.  Sonographer:    Darlina Sicilian RDCS Referring Phys: [id number] DANIEL R BENSIMHON  Sonographer Comments: Image acquisition challenging due to patient body habitus. IMPRESSIONS  1. Left ventricular ejection fraction, by estimation, is 55 to 60%. The left ventricle has normal function. The left ventricle has no regional wall motion abnormalities. Left ventricular diastolic parameters were normal.  2. Right ventricular systolic function is normal. The right ventricular size is normal. There is normal pulmonary artery systolic pressure.  3. The mitral valve is normal in structure. No evidence of mitral valve regurgitation. No evidence of mitral stenosis.  4. The aortic valve is tricuspid. There is mild calcification of the aortic valve. Aortic valve regurgitation is not visualized. No aortic stenosis is present.  5. Incidental finding of probable hepatic cyst. Consider dedicated liver imaging to confirm, if clinically indicated. FINDINGS  Left Ventricle: Left ventricular ejection fraction, by estimation, is 55 to 60%. The left ventricle has normal function. The left ventricle has no regional wall motion abnormalities. The left ventricular internal cavity size was normal in size. There is  no left ventricular hypertrophy. Left ventricular diastolic parameters were normal. Right Ventricle: The right ventricular size is normal. No increase in right ventricular wall thickness. Right ventricular systolic function is normal. There is normal pulmonary artery systolic pressure. The tricuspid regurgitant velocity is 2.35 m/s, and  with an assumed right atrial pressure of 3 mmHg, the estimated right ventricular systolic pressure is 24.4 mmHg. Left  Atrium: Left atrial size was normal in size. Right Atrium: Right atrial size was normal in size. Pericardium: There is no evidence of pericardial effusion. Mitral Valve: The mitral valve is normal in structure. No evidence of mitral valve regurgitation. No evidence of mitral valve stenosis. Tricuspid Valve: The tricuspid valve is normal in structure. Tricuspid valve regurgitation is mild . No evidence of tricuspid stenosis. Aortic Valve: The aortic valve is tricuspid. There is mild calcification of the aortic valve. Aortic valve regurgitation is not visualized. No aortic stenosis is present. Pulmonic Valve: The pulmonic valve was normal in structure. Pulmonic valve regurgitation is not visualized. No evidence of pulmonic stenosis. Aorta: The aortic root and ascending aorta are structurally normal, with no evidence of dilitation. IAS/Shunts: No atrial level shunt detected by color flow Doppler.  LEFT VENTRICLE PLAX 2D LVIDd:         3.50 cm   Diastology LVIDs:         2.60 cm   LV e' medial:    6.57 cm/s LV PW:         0.70 cm   LV E/e' medial:  8.5 LV IVS:        0.70 cm   LV e' lateral:   8.19 cm/s LVOT diam:     1.70 cm   LV E/e' lateral: 6.8 LV SV:         26 LV SV Index:   18 LVOT Area:     2.27 cm  RIGHT VENTRICLE RV S prime:     11.30 cm/s TAPSE (M-mode): 0.9 cm LEFT ATRIUM  Index LA diam:      2.40 cm 1.69 cm/m LA Vol (A2C): 20.4 ml 14.33 ml/m LA Vol (A4C): 12.7 ml 8.92 ml/m  AORTIC VALVE LVOT Vmax:   75.80 cm/s LVOT Vmean:  52.100 cm/s LVOT VTI:    0.115 m  AORTA Ao Root diam: 2.70 cm MITRAL VALVE               TRICUSPID VALVE MV Area (PHT): 3.39 cm    TR Peak grad:   22.1 mmHg MV Decel Time: 224 msec    TR Vmax:        235.00 cm/s MV E velocity: 55.80 cm/s MV A velocity: 53.40 cm/s  SHUNTS MV E/A ratio:  1.04        Systemic VTI:  0.12 m                            Systemic Diam: 1.70 cm Glori Bickers MD Electronically signed by Glori Bickers MD Signature Date/Time: 04/24/2022/12:01:59 PM     Final      Assessment & Plan:   Chronic respiratory failure with hypoxia [facility]) - Patient has been on oxygen since May 2020 and clinically benefits from use.  She had overnight oximetry test in July 2023 that showed she spent 7 hours with SpO2 < 88% on room air. She had a repeat ONO in August on 2 L that reveal no significant desaturations. No daytime requirements. She received a large bill from [employer] for oxygen. We have reached out to her DME company to get this straightened out.  We will place an order to renew oxygen.   COPD GOLD III/ noct 02 only  - Stable; No acute symptoms  - Continue Stiolto Respimat 2 puffs daily   Martyn Ehrich, NP 05/01/2022

## 2022-05-01 NOTE — Assessment & Plan Note (Addendum)
-   Stable; No acute symptoms  - Continue Stiolto Respimat 2 puffs daily

## 2022-05-16 ENCOUNTER — Telehealth (HOSPITAL_COMMUNITY): Payer: Self-pay | Admitting: Cardiology

## 2022-05-16 NOTE — Telephone Encounter (Signed)
At the request of the patient most recent labs mailed to pts address

## 2022-05-22 ENCOUNTER — Other Ambulatory Visit (HOSPITAL_BASED_OUTPATIENT_CLINIC_OR_DEPARTMENT_OTHER): Payer: Self-pay

## 2022-05-22 MED ORDER — COMIRNATY 30 MCG/0.3ML IM SUSY
PREFILLED_SYRINGE | INTRAMUSCULAR | 0 refills | Status: DC
  Filled 2022-05-22: qty 0.3, 1d supply, fill #0

## 2022-06-26 ENCOUNTER — Other Ambulatory Visit (HOSPITAL_COMMUNITY): Payer: Self-pay | Admitting: Internal Medicine

## 2022-06-28 ENCOUNTER — Other Ambulatory Visit (HOSPITAL_COMMUNITY): Payer: Self-pay | Admitting: Internal Medicine

## 2022-06-28 DIAGNOSIS — I509 Heart failure, unspecified: Secondary | ICD-10-CM

## 2022-07-27 ENCOUNTER — Other Ambulatory Visit: Payer: Self-pay | Admitting: Internal Medicine

## 2022-08-08 ENCOUNTER — Encounter: Payer: Self-pay | Admitting: Internal Medicine

## 2022-08-08 ENCOUNTER — Ambulatory Visit: Payer: Medicare Other | Admitting: Internal Medicine

## 2022-08-08 VITALS — BP 98/60 | HR 87 | Temp 97.9°F | Ht 65.0 in | Wt 85.2 lb

## 2022-08-08 DIAGNOSIS — J449 Chronic obstructive pulmonary disease, unspecified: Secondary | ICD-10-CM | POA: Diagnosis not present

## 2022-08-08 DIAGNOSIS — J9611 Chronic respiratory failure with hypoxia: Secondary | ICD-10-CM | POA: Diagnosis not present

## 2022-08-08 NOTE — Patient Instructions (Addendum)
No change in medications   Please schedule a follow up visit in 13 months but call sooner if needed

## 2022-08-08 NOTE — Assessment & Plan Note (Signed)
Quit smoking 09/2018/Evelyn Hughes p presenting with acute hypercarbic/hypoxemic resp failure / acute cor pulmonale that improved  prior to initial pulmonary office visit 11/05/2018 as did hypercarbia  - PFT's  03/08/2019  FEV1 1.06 (39 % ) ratio 0.48  p 5 % improvement from saba p nothing prior to study with DLCO  12.34 (57%) corrects to 3.36 (81%)  for alv volume and FV curve classic concave curvature on exp loop  - 01/07/2020  After extensive coaching inhaler device,  effectiveness =    75% with SMI > try stiolto as mouth/throat pain on [employer]  - alpha one AT screen 01/07/2020 >>>  Evelyn Hughes   Level 181   Pt is Group B in terms of symptom/risk and laba/lama therefore appropriate rx at this point >>>  stiolto and approp saba

## 2022-08-08 NOTE — Progress Notes (Signed)
Evelyn Hughes, female    DOB: 06-04-1955    MRN: [id number]   Brief patient profile:  67  yowf MM/quit smoking 09/2018 with freq cough dx as bronchitis growing up in house of smokers has used inhalers as needed short term only with good activity tolerance then new leg swelling April 2020 eventually admitted x 2    Admit date: 09/27/2018 Discharge date: 10/06/2018  History of present illness:  The patient is an ill-appearing 67 year old female, current every day smoker recently admitted to the hospital ( 5/4-5/8)  and ultimately diagnosed with a combination of what was likely cor pulmonale and acute hypoxic respiratory distress and failure requiring intubation.  She self extubated and was ultimately placed on nasal cannula and discharged home with oxygen.  Per the husband's report and per EMS report the patient has had a gradual decline especially over the last 12 to 24 hours refusing to eat or drink with AMS. Husband called 3, Paramedics found the patient to be hypoxic, Increased her oxygen and transported her to the ED. She was intubated within 30 minutes of arrival. PCCM admitted to ICU and managed care.  5/4 CT abd/ pelvis >> 1.  No acute intra-abdominal process. 2. Small ascites and mild anasarca.   5/4 CT chest >> 1. Moderate right and small left pleural effusions.  No pneumonia. 2. Mild right heart enlargement with dilated main pulmonary artery, suggestive of pulmonary arterial hypertension. 3.  Emphysema  4.  Aortic atherosclerosis    5/4 CTH >> Minimal frontal and parietal lobe atrophy.  Otherwise negative exam.   09/22/2018 Echo The left ventricle has normal systolic function, with an ejection fraction of 55-60%. The cavity size was normal. Left ventricular diastolic function could not be evaluated.  The right ventricle has normal systolc function. The cavity was mildly enlarged. Right ventricular systolic pressure is mildly elevated with an estimated pressure of 46.6 mmHg.   Right atrial size was mildly dilated.  The mitral valve is grossly normal.  The tricuspid valve was grossly normal.  The aortic valve is tricuspid No stenosis of the aortic valve.  Limited study; normal LV function; mild RAE and RVE; moderate RV dysfunction; trace TR; mild pulmonary hypertension.   5/5 Doppler Studies Right: There is no evidence of deep vein thrombosis in the lower extremity. No cystic structure found in the popliteal fossa. Left: There is no evidence of deep vein thrombosis in the lower extremity. No cystic structure found in the popliteal fossa. Micro Data:  5/10 Blood Cultures x 2 5/10 Tracheal Aspirate- GPCs 5/10 Urine Legionella 5/10 Urine Strep 5/10 Covid Negative   Antimicrobials:  Completed Cefepime  Completed IV Vanc    Started on po Azithromycin x 5 days on 10/06/18 for her COPD        Hospital Course:  Active Problems:   Respiratory failure with hypoxia and hypercapnia (HCC)   Pressure injury of skin  Resolving acute hypoxic hypercarbic respiratory failure, multifactorial secondary to acute volume overload, bilateral pleural effusion, COPD exacerbation Intubated on 09/27/2018.  Extubated on 09/29/2018 Currently on 2 L of oxygen by nasal cannula and saturating 97% Completed 5 days of IV cefepime Independent reviewed chest x-ray done on 10/03/2018 which showed small bilateral pleural effusions and right lower lobe atelectasis Patient encouraged to use incentive spirometer. Maintain O2 saturation greater than 90% Failed home O2 evaluation requiring 2 L of oxygen by nasal cannula continuously Continue inhalers Follow-up with pulmonology outpatient   Newly diagnosed diastolic CHF/pulmonary artery hypertension  with cor pulmonale and right ventricular failure Heart failure team is following Continue diuresis as recommended by heart failure team Continue TED hose per heart failure team V/Q showed very low probability PFTs with DLCO pending Pulmonary  rehab on discharge Net I&O -2.5 L since admission Continue cardiac medications On Lasix 20 mg daily and spironolactone 25 mg daily as recommended by cardiology Follow-up with cardiology outpatient   Bilateral lower extremity 2+ pitting edema, improving with TED hose Improving with TED hose and diuretics  Bilateral Doppler ultrasound negative for DVT No obvious discoloration or hyperpigmentation   Emphysema COPD exacerbation Counseled on the importance of tobacco use cessation Maintain O2 saturation greater than 90% Continue inhalers Started Z-Pak, azithromycin 250 mg daily x5 days    History of Present Illness  11/05/2018  Pulmonary/ 1st office eval/Eiza Canniff  Chief Complaint  Patient presents with   Pulmonary Consult    recent hospital admission for cor pulmonale and acute respiratory distress requiring intubation. She states that as far as she is aware she has never had any problems at all with her breathing and she denies any respiratory co's today.   Dyspnea:  Able to walk 13 min s 02 improving spiriva/wixella  Cough: none now Sleep: on 02 2lpm and able to sleep on side  Bed flat  SABA use: ventolin but not needing  rec 02 2lpm at bedtime and adjust during the day to keep it over 90% No change in your pulmonary medications   Please schedule a follow up office visit in 6 weeks, call sooner if needed with all medications    07/18/2021  f/u ov/Blaike Newburn re: GOLD 3/ 02 dep hs only  maint on Stiolto   Chief Complaint  Patient presents with   Follow-up    Follow up. Patient says everything is going good.   Dyspnea:  occ walks neighborhood/ ok one flight  - sats 90s when checks Cough: rarely Sleeping: flat bed 2 pillows  SABA use: none  02: 2lpm hs  Rec  Make sure you check your oxygen saturation  AT  your highest level of activity (not after you stop)   to be sure it stays over 90%    08/08/2022  f/u ov/Bryann Mcnealy re: GOLD 3    maint on stiolto 2 each am   Chief Complaint  Patient  presents with   Follow-up    Doing well.  [place] working well.  Dyspnea:  walking [place], 02 sats typically 94% RA no portable 02  Cough: none Sleeping: flat bed 2 pillows  SABA use: not using  02: 2lpm hs and not needed during the day Lung cancer screening :  q nov     No obvious day to day or daytime variability or assoc excess/ purulent sputum or mucus plugs or hemoptysis or cp or chest tightness, subjective wheeze or overt sinus or hb symptoms.   Sleeping  without nocturnal  or early am exacerbation  of respiratory  c/o's or need for noct saba. Also denies any obvious fluctuation of symptoms with weather or environmental changes or other aggravating or alleviating factors except as outlined above   No unusual exposure hx or h/o childhood pna/ asthma or knowledge of premature birth.  Current Allergies, Complete Past Medical History, Past Surgical History, Family History, and Social History were reviewed in [employer] record.  ROS  The following are not active complaints unless bolded Hoarseness, sore throat, dysphagia, dental problems, itching, sneezing,  nasal congestion or discharge of excess  mucus or purulent secretions, ear ache,   fever, chills, sweats, unintended wt loss or wt gain, classically pleuritic or exertional cp,  orthopnea pnd or arm/hand swelling  or leg swelling, presyncope, palpitations, abdominal pain, anorexia, nausea, vomiting, diarrhea  or change in bowel habits or change in bladder habits, change in stools or change in urine, dysuria, hematuria,  rash, arthralgias, visual complaints, headache, numbness, weakness or ataxia or problems with walking or coordination,  change in mood or  memory.        Current Meds  Medication Sig   albuterol (VENTOLIN HFA) 108 (90 Base) MCG/ACT inhaler Inhale 2 puffs into the lungs every 6 (six) hours as needed for wheezing or shortness of breath.   aspirin EC 81 MG tablet Take 1 tablet (81 mg total) by mouth  daily. Swallow whole. (Patient taking differently: Take 81 mg by mouth daily. Swallow whole. Everyother day)   furosemide (LASIX) 20 MG tablet TAKE 1 TABLET BY MOUTH EVERY OTHER DAY   OXYGEN 1-2 l pm oxygen at qhs and depending on level of activity   rosuvastatin (CRESTOR) 5 MG tablet Take 1 tablet by mouth once daily   spironolactone (ALDACTONE) 25 MG tablet Take 1 tablet by mouth once daily   STIOLTO RESPIMAT 2.5-2.5 MCG/ACT AERS INHALE 2 PUFFS BY MOUTH ONCE DAILY              Objective:    Wt  08/08/2022        85   07/18/2021          92   07/17/2020       100 01/07/2020        105  03/10/19 113 lb (51.3 kg)  02/10/19 111 lb 6 oz (50.5 kg)  11/18/18 100 lb 3.2 oz (45.5 kg)    Vital signs reviewed  08/08/2022  - Note at rest 02 sats  94% on RA   General appearance:    amb wf nad     HEENT : Oropharynx  clear   NECK :  without  apparent JVD/ palpable Nodes/TM    LUNGS: no acc muscle use,  Mild barrel  contour chest wall with bilateral  Distant bs s audible wheeze and  without cough on insp or exp maneuvers  and mild  Hyperresonant  to  percussion bilaterally     CV:  RRR  no s3 or murmur or increase in P2, and no edema   ABD:  soft and nontender with pos end  insp Hoover's  in the supine position.  No bruits or organomegaly appreciated   MS:  Nl gait/ ext warm without deformities Or obvious joint restrictions  calf tenderness, cyanosis or clubbing     SKIN: warm and dry without lesions    NEURO:  alert, approp, nl sensorium with  no motor or cerebellar deficits apparent.             I personally reviewed images and agree with radiology impression as follows:   Chest LDSCT     03/28/22   1. Lung-RADS 2, benign appearance or behavior. Continue annual screening with low-dose chest CT without contrast in 12 months. 2. Aortic atherosclerosis (ICD10-I70.0). Coronary artery calcification. 3. Enlarged pulmonic trunk, indicative of pulmonary arterial hypertension. 4.   Emphysema (ICD10-J43.9).      Assessment

## 2022-08-08 NOTE — Assessment & Plan Note (Signed)
1st placed on 02 09/2018 - HC03  10/21/2018   27  -  01/07/2020   Walked RA  2 laps @ approx 282ft each @ fast pace  stopped due to end of study, no sob sats 96%  -  ONO on RA 12/11/21  desat < 89 x 7 h 9 min so 12/18/2021 rec 2lpm and repeat ONO on 2lpm done 12/25/21 no desats so rec continue 2lpm hs     Pt using nightly, not needed daytime based on 02 sats at peak ex so rec continued monitoring          Each maintenance medication was reviewed in detail including emphasizing most importantly the difference between maintenance and prns and under what circumstances the prns are to be triggered using an action plan format where appropriate.  Total time for H and P, chart review, counseling, reviewing smi/hfa/02 device(s) and generating customized AVS unique to this office visit / same day charting = 25 min

## 2022-08-27 ENCOUNTER — Other Ambulatory Visit: Payer: Self-pay | Admitting: Internal Medicine

## 2022-10-03 ENCOUNTER — Ambulatory Visit
Admission: RE | Admit: 2022-10-03 | Discharge: 2022-10-03 | Disposition: A | Discharge status: Home / Self Care | Payer: Medicare Other | Source: Ambulatory Visit | Attending: Acute Care | Admitting: Acute Care

## 2022-10-03 DIAGNOSIS — R911 Solitary pulmonary nodule: Secondary | ICD-10-CM

## 2022-10-03 DIAGNOSIS — Z87891 Personal history of nicotine dependence: Secondary | ICD-10-CM

## 2022-10-08 ENCOUNTER — Telehealth: Payer: Self-pay | Admitting: Acute Care

## 2022-10-08 ENCOUNTER — Other Ambulatory Visit: Payer: Self-pay

## 2022-10-08 DIAGNOSIS — Z122 Encounter for screening for malignant neoplasm of respiratory organs: Secondary | ICD-10-CM

## 2022-10-08 DIAGNOSIS — Z87891 Personal history of nicotine dependence: Secondary | ICD-10-CM

## 2022-10-08 NOTE — Telephone Encounter (Signed)
Left VM and call back number to call for review of LDCT results 

## 2022-10-09 ENCOUNTER — Telehealth: Payer: Self-pay | Admitting: Acute Care

## 2022-10-09 NOTE — Telephone Encounter (Signed)
Spoke with patient by phone to review results of LDCT.  Follow up CT shows no suspicious findings for lung cancer and will plan next LDCT in one year.  Emphysema and atherosclerosis, as previously noted.  No new findings.  Patient acknowledged understanding.  New order placed for 2025 LDCT and results to PCP.

## 2022-11-25 ENCOUNTER — Other Ambulatory Visit (HOSPITAL_COMMUNITY): Payer: Self-pay | Admitting: Internal Medicine

## 2022-11-25 DIAGNOSIS — I509 Heart failure, unspecified: Secondary | ICD-10-CM

## 2022-12-23 ENCOUNTER — Other Ambulatory Visit (HOSPITAL_COMMUNITY): Payer: Self-pay | Admitting: Internal Medicine

## 2022-12-23 DIAGNOSIS — I509 Heart failure, unspecified: Secondary | ICD-10-CM

## 2023-01-14 ENCOUNTER — Telehealth: Payer: Self-pay | Admitting: Internal Medicine

## 2023-01-14 NOTE — Telephone Encounter (Signed)
[employer] sent her a letter saying they are going to turn her account to collections.This issue has happened before,she said. Yet they have not sent her a bill.  We can not do much about the billing, I know but [employer] is telling her she needs to get re-certified  for O2 yet she just saw Dr. Sherene Sires in March.  This is for night time O2.   Please call to advise about re-certification. Even Dr. Sherene Sires told her she did not need re-cert.  Her # is [phone]   Medicare & Valinda Hoar is her insurance.  [employer] acct # [id number]

## 2023-01-22 NOTE — Telephone Encounter (Signed)
Message sent to [employer] for clarification

## 2023-01-30 ENCOUNTER — Telehealth: Payer: Self-pay | Admitting: Internal Medicine

## 2023-01-30 DIAGNOSIS — J449 Chronic obstructive pulmonary disease, unspecified: Secondary | ICD-10-CM

## 2023-01-30 NOTE — Telephone Encounter (Signed)
Please see last signed encounter. There were two issues and encounter was signed off on before addressing second issue.   It reads:  [employer] is telling her she needs to get re-certified  for O2 yet she just saw Dr. Sherene Sires in March.   This is for night time O2.   Can Triage kindly check on this and call PT with action taken?  (We are told to include all issues PT brings to [place] on each call. Not to do separate encounters. Thanks so much)

## 2023-01-31 NOTE — Telephone Encounter (Signed)
RA is fine

## 2023-01-31 NOTE — Telephone Encounter (Signed)
Dr. Sherene Sires, please advise if you want [place] to order ONO on pt to have her get recertified for nighttime O2.

## 2023-01-31 NOTE — Telephone Encounter (Signed)
ONO order placed. Nothing further needed.

## 2023-02-07 ENCOUNTER — Other Ambulatory Visit (HOSPITAL_BASED_OUTPATIENT_CLINIC_OR_DEPARTMENT_OTHER): Payer: Self-pay

## 2023-02-07 MED ORDER — COMIRNATY 30 MCG/0.3ML IM SUSY
PREFILLED_SYRINGE | INTRAMUSCULAR | 0 refills | Status: DC
  Filled 2023-02-07: qty 0.3, 1d supply, fill #0

## 2023-05-29 ENCOUNTER — Encounter (HOSPITAL_COMMUNITY): Payer: Self-pay | Admitting: Internal Medicine

## 2023-05-29 ENCOUNTER — Ambulatory Visit (HOSPITAL_COMMUNITY)
Admission: RE | Admit: 2023-05-29 | Discharge: 2023-05-29 | Disposition: A | Discharge status: Home / Self Care | Payer: Medicare PPO | Source: Ambulatory Visit | Attending: Internal Medicine | Admitting: Internal Medicine

## 2023-05-29 VITALS — BP 100/60 | HR 109 | Wt 82.8 lb

## 2023-05-29 DIAGNOSIS — I2721 Secondary pulmonary arterial hypertension: Secondary | ICD-10-CM

## 2023-05-29 DIAGNOSIS — R002 Palpitations: Secondary | ICD-10-CM | POA: Diagnosis not present

## 2023-05-29 DIAGNOSIS — I272 Pulmonary hypertension, unspecified: Secondary | ICD-10-CM | POA: Diagnosis not present

## 2023-05-29 DIAGNOSIS — Z7982 Long term (current) use of aspirin: Secondary | ICD-10-CM | POA: Insufficient documentation

## 2023-05-29 DIAGNOSIS — J9610 Chronic respiratory failure, unspecified whether with hypoxia or hypercapnia: Secondary | ICD-10-CM | POA: Diagnosis not present

## 2023-05-29 DIAGNOSIS — Z87891 Personal history of nicotine dependence: Secondary | ICD-10-CM | POA: Diagnosis not present

## 2023-05-29 DIAGNOSIS — Z86718 Personal history of other venous thrombosis and embolism: Secondary | ICD-10-CM | POA: Diagnosis not present

## 2023-05-29 DIAGNOSIS — Z79899 Other long term (current) drug therapy: Secondary | ICD-10-CM | POA: Insufficient documentation

## 2023-05-29 DIAGNOSIS — I5032 Chronic diastolic (congestive) heart failure: Secondary | ICD-10-CM | POA: Diagnosis present

## 2023-05-29 DIAGNOSIS — J449 Chronic obstructive pulmonary disease, unspecified: Secondary | ICD-10-CM | POA: Insufficient documentation

## 2023-05-29 DIAGNOSIS — R000 Tachycardia, unspecified: Secondary | ICD-10-CM | POA: Diagnosis not present

## 2023-05-29 DIAGNOSIS — I2781 Cor pulmonale (chronic): Secondary | ICD-10-CM | POA: Insufficient documentation

## 2023-05-29 NOTE — Patient Instructions (Addendum)
 Good to see you today!  Your physician recommends that you schedule a follow-up appointment in: 1 year with an echocardiogram (Jan 2026), **PLEASE CALL OUR OFFICE IN NOVEMBER TO SCHEDULE THIS APPOINTMENT   If you have any questions or concerns before your next appointment please send us  a message through [place] or call our office at [phone].    TO LEAVE A MESSAGE FOR THE NURSE SELECT OPTION 2, PLEASE LEAVE A MESSAGE INCLUDING: YOUR NAME DATE OF BIRTH CALL BACK NUMBER REASON FOR CALL**this is important as we prioritize the call backs  YOU WILL RECEIVE A CALL BACK THE SAME DAY AS LONG AS YOU CALL BEFORE 4:00 PM At the [facility], you and your health needs are our priority. As part of our continuing mission to provide you with exceptional heart care, we have created designated Provider Care Teams. These Care Teams include your primary Cardiologist (physician) and Advanced Practice Providers (APPs- Physician Assistants and Nurse Practitioners) who all work together to provide you with the care you need, when you need it.   You may see any of the following providers on your designated Care Team at your next follow up: Dr Toribio Fuel Dr Ezra Shuck Dr. Ria Commander Dr. Morene Brownie Amy Lenetta, NP Caffie Shed, [place] [facility] [place], [place] Beckey Coe, NP Jordan Lee, NP Tinnie Redman, PharmD   Please be sure to bring in all your medications bottles to every appointment.    Thank you for choosing [facility]

## 2023-05-29 NOTE — Progress Notes (Signed)
 [facility] Note   Date:  05/29/2023   ID:  Evelyn Hughes, DOB September 02, 1955, MRN [id number]  Location: Home  Provider location:  [facility] Type of Visit: Established patient   PCP:  Alec House, MD  Cardiologist:  None Primary HF: Dr Cherrie   Chief Complaint: Heart Failure   History of Present Illness: Evelyn Hughes is a 68 y.o. female with a history of tobacco abuse, COPD, pulmonary HTN, and chronic respiratory failure.   Admitted to [facility] 09/27/2018 with AMS found to be hypoxic on arrival requiring intubation. She was later extubated on 09/29/2018. Placed on IV lasix  due to leg edema. Once diuresed, RHC was performed. RHC showed minimally elevated PA pressures and mildly elevated cardiac output. She was discharged on lasix  20 mg daily. Discharge weight was 111 pounds.   She presents for routine f/u. Here with [name]. Feels ok. Denies CP. Exertional dyspnea unchanged. Good appetite but not gaining weight.. Uses O2 at night (follows with Dr Darlean) Had an episode were her HR went to 175 lasted 5 mins and resolved.   Echo 04/25/22 EF 55-60% RV ok   Echo 9/21 EF 45-50% (I thought 50-55%) wit basilar to mid HK of inferior I nferior lateral wall.  ECHO 09/22/2018 LVEF 55-60%, RV normal  PFTs FEV1 1.01 (37%) FVC   2.17 (62% FEF 25-75 0.36 DLCO 57%  RHC 10/05/18  RA = 1 ( v waves to 10 due to TR) RV = 32/4 PA = 35/11 (24) PCW = 10 Fick cardiac output/index = 6.42/4.15 PVR = 2.2 WU Ao sat = 99% PA sat =79%, 80% SVC = 79%     Past Medical History:  Diagnosis Date   Allergy    Past Surgical History:  Procedure Laterality Date   RIGHT HEART CATH N/A 10/05/2018   Procedure: RIGHT HEART CATH;  Surgeon: Cherrie Toribio SAUNDERS, MD;  Location: [facility] INVASIVE CV LAB;  Service: Cardiovascular;  Laterality: N/A;     Current Outpatient Medications  Medication Sig Dispense Refill   albuterol  (VENTOLIN  HFA) 108 (90 Base) MCG/ACT inhaler  Inhale 2 puffs into the lungs every 6 (six) hours as needed for wheezing or shortness of breath. 1 Inhaler 2   aspirin  81 MG chewable tablet Chew 81 mg by mouth every other day.     furosemide  (LASIX ) 20 MG tablet TAKE 1 TABLET BY MOUTH EVERY OTHER DAY 45 tablet 3   OXYGEN 1-2 l pm oxygen at qhs and depending on level of activity     rosuvastatin  (CRESTOR ) 5 MG tablet Take 1 tablet by mouth once daily 90 tablet 2   spironolactone  (ALDACTONE ) 25 MG tablet Take 1 tablet by mouth once daily 90 tablet 3   Tiotropium Bromide -Olodaterol (STIOLTO RESPIMAT ) 2.5-2.5 MCG/ACT AERS INHALE 2 PUFFS BY MOUTH ONCE DAILY 4 g 11   No current facility-administered medications for this encounter.    Allergies:   Shellfish allergy, Expectorant cough control [guaifenesin ], Sulfa antibiotics, Penicillins, and Pineapple   Social History:  The patient  reports that she quit smoking about 4 years ago. Her smoking use included cigarettes. She started smoking about 52 years ago. She has a 96 pack-year smoking history. She has never used smokeless tobacco. She reports that she does not drink alcohol and does not use drugs.   Family History:  The patient's family history includes Cancer in her mother.   ROS:  Please see the history of present illness.  All other systems are personally reviewed and negative.   Vitals:   05/29/23 1218  BP: 100/60  Pulse: (!) 109  SpO2: 93%   Wt Readings from Last 3 Encounters:  05/29/23 37.6 kg (82 lb 12.8 oz)  08/08/22 38.6 kg (85 lb 3.2 oz)  05/01/22 38.6 kg (85 lb)      Exam:   General:  Thin.Frail No resp difficulty HEENT: normal Neck: supple. no JVD. Carotids 2+ bilat; no bruits. No lymphadenopathy or thryomegaly appreciated. Cor: Regular.Midly tachy No rubs, gallops or murmurs. Lungs: decreased throughout Abdomen: soft, nontender, nondistended. No hepatosplenomegaly. No bruits or masses. Good bowel sounds. Extremities: no cyanosis, clubbing, rash, edema Neuro: alert &  orientedx3, cranial nerves grossly intact. moves all 4 extremities w/o difficulty. Affect pleasant   ECG: Sinus tach 102 Pulmonary disease pattern Personally reviewed   Recent Labs: No results found for requested labs within last 365 days.  Personally reviewed    ASSESSMENT AND PLAN:  1. 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  - RHC 5/20  Minimally elevated PA pressures and PVR 2.2.  - Much improved with diuresis and O2. Only using O2 at night - RV improved on echo 9/21. No significant PAH  - Echo 12/23 RV normal. No evidence of PAH  - stable NYHA II-III - Continue home oxygen as needed - followed by Dr Darlean - Will repeat echo at next visit  2. Chronic respiratory failure due to advanced COPD - Wears O2 at night and as needed - Uses pulse ox to ensure sat > 90% - Follows with Dr. Darlean - No change   3. Chronic diastolic HF - Volume status stable - Echo 9/21 EF 50% mild inferior WMA - Echo 04/24/22 EF 55-60% - Continue 25 mg spironolactone  daily.    4. Coronary calcium  on CT - Previous echo with mild inferior WMA. Now resolved - No s/s angina - On ASA and low-dose statin. Last LDL 11/22 LDL 142  - No role for cath as she is not surgical candidate and not having angina  4. H/o Remote DVT due to OCPs - LE dopplers negative  - VQ negative - no changed  5. Tachycardia/palpitations - Place zio x 7 days    Signed, Toribio Fuel, MD  05/29/2023 12:46 PM  [facility] [address] [facility] [place] [address] [phone] (office) [phone] (fax)

## 2023-08-27 ENCOUNTER — Other Ambulatory Visit: Payer: Self-pay | Admitting: Internal Medicine

## 2023-09-24 ENCOUNTER — Other Ambulatory Visit: Payer: Self-pay | Admitting: Internal Medicine

## 2023-09-24 ENCOUNTER — Other Ambulatory Visit (HOSPITAL_COMMUNITY): Payer: Self-pay | Admitting: Internal Medicine

## 2023-09-28 NOTE — Progress Notes (Unsigned)
 Evelyn Hughes, female    DOB: [birth year]    MRN: [id number]   Brief patient profile:  7 yowf MM/quit smoking 09/2018 with freq cough dx as bronchitis growing up in house of smokers has used inhalers as needed short term only with good activity tolerance then new leg swelling April 2020 eventually admitted x 2    Admit date: 09/27/2018 Discharge date: 10/06/2018  History of present illness:  The patient is an ill-appearing 68 year old female, current every day smoker recently admitted to the hospital ( 5/4-5/8)  and ultimately diagnosed with a combination of what was likely cor pulmonale and acute hypoxic respiratory distress and failure requiring intubation.  She self extubated and was ultimately placed on nasal cannula and discharged home with oxygen.  Per the husband's report and per EMS report the patient has had a gradual decline especially over the last 12 to 24 hours refusing to eat or drink with AMS. Husband called 911, Paramedics found the patient to be hypoxic, Increased her oxygen and transported her to the ED. She was intubated within 30 minutes of arrival. PCCM admitted to ICU and managed care.  5/4 CT abd/ pelvis >> 1.  No acute intra-abdominal process. 2. Small ascites and mild anasarca.   5/4 CT chest >> 1. Moderate right and small left pleural effusions.  No pneumonia. 2. Mild right heart enlargement with dilated main pulmonary artery, suggestive of pulmonary arterial hypertension. 3.  Emphysema  4.  Aortic atherosclerosis    5/4 CTH >> Minimal frontal and parietal lobe atrophy.  Otherwise negative exam.   09/22/2018 Echo The left ventricle has normal systolic function, with an ejection fraction of 55-60%. The cavity size was normal. Left ventricular diastolic function could not be evaluated.  The right ventricle has normal systolc function. The cavity was mildly enlarged. Right ventricular systolic pressure is mildly elevated with an estimated pressure of 46.6 mmHg.   Right atrial size was mildly dilated.  The mitral valve is grossly normal.  The tricuspid valve was grossly normal.  The aortic valve is tricuspid No stenosis of the aortic valve.  Limited study; normal LV function; mild RAE and RVE; moderate RV dysfunction; trace TR; mild pulmonary hypertension.   5/5 Doppler Studies Right: There is no evidence of deep vein thrombosis in the lower extremity. No cystic structure found in the popliteal fossa. Left: There is no evidence of deep vein thrombosis in the lower extremity. No cystic structure found in the popliteal fossa. Micro Data:  5/10 Blood Cultures x 2 5/10 Tracheal Aspirate- GPCs 5/10 Urine Legionella 5/10 Urine Strep 5/10 Covid Negative   Antimicrobials:  Completed Cefepime   Completed IV Vanc    Started on po Azithromycin  x 5 days on 10/06/18 for her COPD        Hospital Course:  Active Problems:   Respiratory failure with hypoxia and hypercapnia (HCC)   Pressure injury of skin  Resolving acute hypoxic hypercarbic respiratory failure, multifactorial secondary to acute volume overload, bilateral pleural effusion, COPD exacerbation Intubated on 09/27/2018.  Extubated on 09/29/2018 Currently on 2 L of oxygen by nasal cannula and saturating 97% Completed 5 days of IV cefepime  Independent reviewed chest x-ray done on 10/03/2018 which showed small bilateral pleural effusions and right lower lobe atelectasis Patient encouraged to use incentive spirometer. Maintain O2 saturation greater than 90% Failed home O2 evaluation requiring 2 L of oxygen by nasal cannula continuously Continue inhalers Follow-up with pulmonology outpatient   Newly diagnosed diastolic CHF/pulmonary artery hypertension with  cor pulmonale and right ventricular failure Heart failure team is following Continue diuresis as recommended by heart failure team Continue TED hose per heart failure team V/Q showed very low probability PFTs with DLCO pending Pulmonary  rehab on discharge Net I&O -2.5 L since admission Continue cardiac medications On Lasix  20 mg daily and spironolactone  25 mg daily as recommended by cardiology Follow-up with cardiology outpatient   Bilateral lower extremity 2+ pitting edema, improving with TED hose Improving with TED hose and diuretics  Bilateral Doppler ultrasound negative for DVT No obvious discoloration or hyperpigmentation   Emphysema COPD exacerbation Counseled on the importance of tobacco use cessation Maintain O2 saturation greater than 90% Continue inhalers Started Z-Pak, azithromycin  250 mg daily x5 days    History of Present Illness  11/05/2018  Pulmonary/ 1st office eval/[name]  Chief Complaint  Patient presents with   Pulmonary Consult    recent hospital admission for cor pulmonale and acute respiratory distress requiring intubation. She states that as far as she is aware she has never had any problems at all with her breathing and she denies any respiratory co's today.   Dyspnea:  Able to walk 13 min s 02 improving spiriva /wixella  Cough: none now Sleep: on 02 2lpm and able to sleep on side  Bed flat  SABA use: ventolin  but not needing  rec 02 2lpm at bedtime and adjust during the day to keep it over 90% No change in your pulmonary medications   Please schedule a follow up office visit in 6 weeks, call sooner if needed with all medications   08/08/2022  f/u ov/[name] re: GOLD 3    maint on stiolto 2 each am   Chief Complaint  Patient presents with   Follow-up    Doing well.  Stiolto working well.  Dyspnea:  walking [place], 02 sats typically 94% RA no portable 02  Cough: none Sleeping: flat bed 2 pillows  SABA use: not using  02: 2lpm hs and not needed during the day Lung cancer screening :  q nov   Rec No change rx    10/01/2023  f/u ov/[name] re: gold 3 COPD  maint on Stiolto  Chief Complaint  Patient presents with   Follow-up    Respiratory failure and COPD f/u - uses 2L O2 HS  Dyspnea:   [place] sats ok RA Cough: min mucoid in am's mostly  Sleeping: flat bed 2-3 pillows  no noct   resp cc  SABA use: never  02: 2lpm hs   Lung cancer screening :  10/02/12 RAS 2     No obvious day to day or daytime variability or assoc excess/ purulent sputum or mucus plugs or hemoptysis or cp or chest tightness, subjective wheeze or overt sinus or hb symptoms.    Also denies any obvious fluctuation of symptoms with weather or environmental changes or other aggravating or alleviating factors except as outlined above   No unusual exposure hx or h/o childhood pna/ asthma or knowledge of premature birth.  Current Allergies, Complete Past Medical History, Past Surgical History, Family History, and Social History were reviewed in [employer] record.  ROS  The following are not active complaints unless bolded Hoarseness, sore throat, dysphagia, dental problems, itching, sneezing,  nasal congestion or discharge of excess mucus or purulent secretions, ear ache,   fever, chills, sweats, unintended wt loss or wt gain, classically pleuritic or exertional cp,  orthopnea pnd or arm/hand swelling  or leg  swelling, presyncope, palpitations, abdominal pain, anorexia, nausea, vomiting, diarrhea  or change in bowel habits or change in bladder habits, change in stools or change in urine, dysuria, hematuria,  rash, arthralgias, visual complaints, headache, numbness, weakness or ataxia or problems with walking or coordination,  change in mood or  memory.        Current Meds  Medication Sig   albuterol  (VENTOLIN  HFA) 108 (90 Base) MCG/ACT inhaler Inhale 2 puffs into the lungs every 6 (six) hours as needed for wheezing or shortness of breath.   aspirin  81 MG chewable tablet Chew 81 mg by mouth every other day.   furosemide  (LASIX ) 20 MG tablet TAKE 1 TABLET BY MOUTH EVERY OTHER DAY   OXYGEN 1-2 l pm oxygen at qhs and depending on level of activity   rosuvastatin   (CRESTOR ) 5 MG tablet Take 1 tablet by mouth once daily   spironolactone  (ALDACTONE ) 25 MG tablet Take 1 tablet by mouth once daily   Tiotropium Bromide -Olodaterol (STIOLTO RESPIMAT ) 2.5-2.5 MCG/ACT AERS Inhale 2 puffs into the lungs daily. MUST KEEP APPT FOR REFILLS            Objective:    Wt  10/01/2023        82  08/08/2022        85   07/18/2021          92   07/17/2020       100 01/07/2020        105  03/10/19 113 lb (51.3 kg)  02/10/19 111 lb 6 oz (50.5 kg)  11/18/18 100 lb 3.2 oz (45.5 kg)    Vital signs reviewed  10/01/2023  - Note at rest 02 sats  91% on RA   General appearance:    amb wf nad   HEENT : Oropharynx  clear   NECK :  without  apparent JVD/ palpable Nodes/TM    LUNGS: no acc muscle use,  Mild barrel  contour chest wall with bilateral  Distant bs s audible wheeze and  without cough on insp or exp maneuvers  and mild  Hyperresonant  to  percussion bilaterally     CV:  RRR  no s3 or murmur or increase in P2, and no edema   ABD:  soft and nontender with pos end  insp Hoover's  in the supine position.  No bruits or organomegaly appreciated   MS:  Nl gait/ ext warm without deformities Or obvious joint restrictions  calf tenderness, cyanosis or clubbing     SKIN: warm and dry without lesions    NEURO:  alert, approp, nl sensorium with  no motor or cerebellar deficits apparent.              Assessment

## 2023-10-01 ENCOUNTER — Ambulatory Visit: Payer: Medicare PPO | Admitting: Internal Medicine

## 2023-10-01 ENCOUNTER — Encounter: Payer: Self-pay | Admitting: Internal Medicine

## 2023-10-01 VITALS — BP 104/64 | HR 91 | Temp 97.2°F | Ht 65.0 in | Wt 82.4 lb

## 2023-10-01 DIAGNOSIS — J9690 Respiratory failure, unspecified, unspecified whether with hypoxia or hypercapnia: Secondary | ICD-10-CM | POA: Diagnosis not present

## 2023-10-01 DIAGNOSIS — F17210 Nicotine dependence, cigarettes, uncomplicated: Secondary | ICD-10-CM | POA: Diagnosis not present

## 2023-10-01 DIAGNOSIS — J40 Bronchitis, not specified as acute or chronic: Secondary | ICD-10-CM | POA: Diagnosis not present

## 2023-10-01 DIAGNOSIS — J449 Chronic obstructive pulmonary disease, unspecified: Secondary | ICD-10-CM

## 2023-10-01 DIAGNOSIS — J9611 Chronic respiratory failure with hypoxia: Secondary | ICD-10-CM

## 2023-10-01 MED ORDER — STIOLTO RESPIMAT 2.5-2.5 MCG/ACT IN AERS: 2.0000 | INHALATION_SPRAY | Freq: Every day | RESPIRATORY_TRACT | 11 refills | Status: AC

## 2023-10-01 NOTE — Assessment & Plan Note (Signed)
 1st placed on 02 09/2018 - HC03  10/21/2018   27  -  01/07/2020   Walked RA  2 laps @ approx 259ft each @ fast pace  stopped due to end of study, no sob sats 96%  -  ONO on RA 12/11/21  desat < 89 x 7 h 9 min so 12/18/2021 rec 2lpm and repeat ONO on 2lpm done 12/25/21 no desats so rec continue 2lpm hs   Continue noct 02 @  2lpm / none needed daytime          Each maintenance medication was reviewed in detail including emphasizing most importantly the difference between maintenance and prns and under what circumstances the prns are to be triggered using an action plan format where appropriate.  Total time for H and P, chart review, counseling, reviewing [facility], hfa/ 02 /pulse ox  device(s) and generating customized AVS unique to this office visit / same day charting = 25 min

## 2023-10-01 NOTE — Patient Instructions (Addendum)
 As needed for itching/ sneezing/ runny nose > zyrtec 10 mg - if makes you drowsy take at bedtime   Please schedule a follow up visit in 12  months but call sooner if needed

## 2023-10-01 NOTE — Assessment & Plan Note (Signed)
 Quit smoking 09/2018/MM p presenting with acute hypercarbic/hypoxemic resp failure / acute cor pulmonale that improved  prior to initial pulmonary office visit 11/05/2018 as did hypercarbia  - PFT's  03/08/2019  FEV1 1.06 (39 % ) ratio 0.48  p 5 % improvement from saba p nothing prior to study with DLCO  12.34 (57%) corrects to 3.36 (81%)  for alv volume and FV curve classic concave curvature on exp loop  - 01/07/2020  After extensive coaching inhaler device,  effectiveness =    75% with SMI > try stiolto as mouth/throat pain on advair  - alpha one AT screen 01/07/2020 >>>  MM   Level 181  - LDSCT  10/03/22 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

## 2023-10-02 ENCOUNTER — Other Ambulatory Visit (HOSPITAL_COMMUNITY): Payer: Self-pay

## 2023-10-02 DIAGNOSIS — I5032 Chronic diastolic (congestive) heart failure: Secondary | ICD-10-CM

## 2023-10-02 MED ORDER — ROSUVASTATIN CALCIUM 5 MG PO TABS: 5.0000 mg | ORAL_TABLET | Freq: Every day | ORAL | 3 refills | Status: AC

## 2023-10-21 ENCOUNTER — Telehealth: Payer: Self-pay

## 2023-10-21 NOTE — Telephone Encounter (Signed)
 LVM to call and schedule annual Lung CT.

## 2023-10-28 ENCOUNTER — Other Ambulatory Visit: Payer: Self-pay | Admitting: Acute Care

## 2023-10-28 DIAGNOSIS — Z87891 Personal history of nicotine dependence: Secondary | ICD-10-CM

## 2023-10-28 DIAGNOSIS — Z122 Encounter for screening for malignant neoplasm of respiratory organs: Secondary | ICD-10-CM

## 2023-11-07 ENCOUNTER — Telehealth: Payer: Self-pay | Admitting: Acute Care

## 2023-11-07 NOTE — Telephone Encounter (Signed)
 Returned call from VM. Patient cancelled LDCT due to the 'heat' is too much for her to go out.  There was no answer.  Left VM and callback number to call [phone] when patient is ready to reschedule

## 2023-11-11 ENCOUNTER — Other Ambulatory Visit

## 2023-11-24 ENCOUNTER — Other Ambulatory Visit (HOSPITAL_COMMUNITY): Payer: Self-pay | Admitting: Internal Medicine

## 2023-11-24 DIAGNOSIS — I509 Heart failure, unspecified: Secondary | ICD-10-CM

## 2023-12-24 ENCOUNTER — Other Ambulatory Visit (HOSPITAL_COMMUNITY): Payer: Self-pay | Admitting: Internal Medicine

## 2023-12-24 DIAGNOSIS — I509 Heart failure, unspecified: Secondary | ICD-10-CM

## 2024-04-21 ENCOUNTER — Other Ambulatory Visit (HOSPITAL_COMMUNITY): Payer: Self-pay | Admitting: *Deleted

## 2024-04-21 DIAGNOSIS — I5032 Chronic diastolic (congestive) heart failure: Secondary | ICD-10-CM

## 2024-05-24 ENCOUNTER — Ambulatory Visit (HOSPITAL_COMMUNITY): Admitting: Internal Medicine

## 2024-05-24 ENCOUNTER — Other Ambulatory Visit (HOSPITAL_COMMUNITY)

## 2024-06-10 ENCOUNTER — Telehealth (HOSPITAL_COMMUNITY): Payer: Self-pay | Admitting: Internal Medicine

## 2024-08-27 ENCOUNTER — Ambulatory Visit (HOSPITAL_COMMUNITY): Admitting: Internal Medicine

## 2024-08-27 ENCOUNTER — Other Ambulatory Visit (HOSPITAL_COMMUNITY)

## 2024-10-19 ENCOUNTER — Ambulatory Visit: Admitting: Internal Medicine
